# Patient Record
Sex: Female | Born: 1935
Health system: Southern US, Community
[De-identification: ages and names within clinical notes are randomized; demographics above are authoritative.]

## PROBLEM LIST (undated history)

## (undated) DIAGNOSIS — I10 Essential (primary) hypertension: Secondary | ICD-10-CM

## (undated) DIAGNOSIS — K529 Noninfective gastroenteritis and colitis, unspecified: Secondary | ICD-10-CM

## (undated) DIAGNOSIS — K5790 Diverticulosis of intestine, part unspecified, without perforation or abscess without bleeding: Secondary | ICD-10-CM

## (undated) DIAGNOSIS — E559 Vitamin D deficiency, unspecified: Secondary | ICD-10-CM

## (undated) DIAGNOSIS — E7800 Pure hypercholesterolemia, unspecified: Secondary | ICD-10-CM

## (undated) DIAGNOSIS — M8580 Other specified disorders of bone density and structure, unspecified site: Secondary | ICD-10-CM

## (undated) DIAGNOSIS — H409 Unspecified glaucoma: Secondary | ICD-10-CM

## (undated) DIAGNOSIS — E079 Disorder of thyroid, unspecified: Secondary | ICD-10-CM

## (undated) DIAGNOSIS — N6019 Diffuse cystic mastopathy of unspecified breast: Secondary | ICD-10-CM

## (undated) DIAGNOSIS — K219 Gastro-esophageal reflux disease without esophagitis: Secondary | ICD-10-CM

## (undated) DIAGNOSIS — M1990 Unspecified osteoarthritis, unspecified site: Secondary | ICD-10-CM

## (undated) DIAGNOSIS — D649 Anemia, unspecified: Secondary | ICD-10-CM

## (undated) DIAGNOSIS — K589 Irritable bowel syndrome without diarrhea: Secondary | ICD-10-CM

## (undated) DIAGNOSIS — R011 Cardiac murmur, unspecified: Secondary | ICD-10-CM

## (undated) DIAGNOSIS — K648 Other hemorrhoids: Secondary | ICD-10-CM

## (undated) DIAGNOSIS — B019 Varicella without complication: Secondary | ICD-10-CM

## (undated) DIAGNOSIS — G43909 Migraine, unspecified, not intractable, without status migrainosus: Secondary | ICD-10-CM

## (undated) DIAGNOSIS — D126 Benign neoplasm of colon, unspecified: Secondary | ICD-10-CM

## (undated) DIAGNOSIS — K5792 Diverticulitis of intestine, part unspecified, without perforation or abscess without bleeding: Secondary | ICD-10-CM

## (undated) DIAGNOSIS — Z8659 Personal history of other mental and behavioral disorders: Secondary | ICD-10-CM

## (undated) DIAGNOSIS — R768 Other specified abnormal immunological findings in serum: Secondary | ICD-10-CM

## (undated) DIAGNOSIS — I639 Cerebral infarction, unspecified: Secondary | ICD-10-CM

## (undated) DIAGNOSIS — Z85828 Personal history of other malignant neoplasm of skin: Secondary | ICD-10-CM

## (undated) HISTORY — DX: Migraine, unspecified, not intractable, without status migrainosus: G43.909

## (undated) HISTORY — DX: Personal history of other mental and behavioral disorders: Z86.59

## (undated) HISTORY — DX: Cardiac murmur, unspecified: R01.1

## (undated) HISTORY — DX: Varicella without complication: B01.9

## (undated) HISTORY — PX: MANDIBLE FRACTURE SURGERY: SHX706

## (undated) HISTORY — DX: Noninfective gastroenteritis and colitis, unspecified: K52.9

## (undated) HISTORY — DX: Essential (primary) hypertension: I10

## (undated) HISTORY — DX: Unspecified glaucoma: H40.9

## (undated) HISTORY — DX: Unspecified osteoarthritis, unspecified site: M19.90

## (undated) HISTORY — DX: Other hemorrhoids: K64.8

## (undated) HISTORY — DX: Diverticulosis of intestine, part unspecified, without perforation or abscess without bleeding: K57.90

## (undated) HISTORY — DX: Diffuse cystic mastopathy of unspecified breast: N60.19

## (undated) HISTORY — DX: Diverticulitis of intestine, part unspecified, without perforation or abscess without bleeding: K57.92

## (undated) HISTORY — DX: Personal history of other malignant neoplasm of skin: Z85.828

## (undated) HISTORY — PX: APPENDECTOMY: SHX54

## (undated) HISTORY — DX: Gastro-esophageal reflux disease without esophagitis: K21.9

## (undated) HISTORY — DX: Vitamin D deficiency, unspecified: E55.9

## (undated) HISTORY — DX: Cerebral infarction, unspecified: I63.9

## (undated) HISTORY — DX: Irritable bowel syndrome, unspecified: K58.9

## (undated) HISTORY — DX: Other specified abnormal immunological findings in serum: R76.8

## (undated) HISTORY — DX: Anemia, unspecified: D64.9

## (undated) HISTORY — PX: BREAST BIOPSY: SHX20

## (undated) HISTORY — DX: Pure hypercholesterolemia, unspecified: E78.00

## (undated) HISTORY — DX: Other specified disorders of bone density and structure, unspecified site: M85.80

## (undated) HISTORY — DX: Benign neoplasm of colon, unspecified: D12.6

---

## 1971-01-29 HISTORY — PX: ABDOMINAL HYSTERECTOMY: SHX81

## 2002-01-28 HISTORY — PX: BILATERAL SALPINGOOPHORECTOMY: SHX1223

## 2003-01-29 HISTORY — PX: VENTRAL HERNIA REPAIR: SHX424

## 2003-06-21 ENCOUNTER — Other Ambulatory Visit: Payer: Self-pay

## 2004-09-25 ENCOUNTER — Ambulatory Visit: Payer: Self-pay | Admitting: Internal Medicine

## 2005-04-02 ENCOUNTER — Ambulatory Visit: Payer: Self-pay | Admitting: Urology

## 2005-05-20 ENCOUNTER — Ambulatory Visit: Payer: Self-pay | Admitting: Unknown Physician Specialty

## 2005-05-29 ENCOUNTER — Emergency Department: Payer: Self-pay | Admitting: Unknown Physician Specialty

## 2005-07-02 ENCOUNTER — Ambulatory Visit: Payer: Self-pay | Admitting: Otolaryngology

## 2005-10-16 ENCOUNTER — Ambulatory Visit: Payer: Self-pay | Admitting: Internal Medicine

## 2005-10-30 ENCOUNTER — Emergency Department: Payer: Self-pay | Admitting: Emergency Medicine

## 2005-12-05 ENCOUNTER — Ambulatory Visit: Payer: Self-pay | Admitting: Internal Medicine

## 2006-07-03 ENCOUNTER — Other Ambulatory Visit: Payer: Self-pay

## 2006-07-03 ENCOUNTER — Emergency Department: Payer: Self-pay | Admitting: Emergency Medicine

## 2006-09-03 ENCOUNTER — Ambulatory Visit: Payer: Self-pay | Admitting: Ophthalmology

## 2007-01-13 ENCOUNTER — Ambulatory Visit: Payer: Self-pay | Admitting: Internal Medicine

## 2007-09-11 ENCOUNTER — Ambulatory Visit: Payer: Self-pay | Admitting: Internal Medicine

## 2008-01-30 ENCOUNTER — Emergency Department: Payer: Self-pay | Admitting: Emergency Medicine

## 2008-03-11 ENCOUNTER — Inpatient Hospital Stay: Payer: Self-pay | Admitting: Internal Medicine

## 2008-03-23 ENCOUNTER — Inpatient Hospital Stay: Payer: Self-pay | Admitting: Internal Medicine

## 2008-04-05 ENCOUNTER — Emergency Department: Payer: Self-pay | Admitting: Emergency Medicine

## 2008-04-12 ENCOUNTER — Ambulatory Visit: Payer: Self-pay | Admitting: Internal Medicine

## 2008-06-29 ENCOUNTER — Ambulatory Visit: Payer: Self-pay | Admitting: Internal Medicine

## 2009-07-14 ENCOUNTER — Ambulatory Visit: Payer: Self-pay | Admitting: Internal Medicine

## 2010-04-28 ENCOUNTER — Emergency Department: Payer: Self-pay | Admitting: Emergency Medicine

## 2010-08-09 ENCOUNTER — Ambulatory Visit: Payer: Self-pay | Admitting: Internal Medicine

## 2011-03-08 DIAGNOSIS — H353190 Nonexudative age-related macular degeneration, unspecified eye, stage unspecified: Secondary | ICD-10-CM | POA: Diagnosis not present

## 2011-03-28 DIAGNOSIS — E7800 Pure hypercholesterolemia, unspecified: Secondary | ICD-10-CM | POA: Diagnosis not present

## 2011-03-28 DIAGNOSIS — K219 Gastro-esophageal reflux disease without esophagitis: Secondary | ICD-10-CM | POA: Diagnosis not present

## 2011-03-28 DIAGNOSIS — I10 Essential (primary) hypertension: Secondary | ICD-10-CM | POA: Diagnosis not present

## 2011-03-28 DIAGNOSIS — R5381 Other malaise: Secondary | ICD-10-CM | POA: Diagnosis not present

## 2011-04-08 DIAGNOSIS — E7800 Pure hypercholesterolemia, unspecified: Secondary | ICD-10-CM | POA: Diagnosis not present

## 2011-04-08 DIAGNOSIS — R5381 Other malaise: Secondary | ICD-10-CM | POA: Diagnosis not present

## 2011-04-08 DIAGNOSIS — E559 Vitamin D deficiency, unspecified: Secondary | ICD-10-CM | POA: Diagnosis not present

## 2011-04-08 DIAGNOSIS — I10 Essential (primary) hypertension: Secondary | ICD-10-CM | POA: Diagnosis not present

## 2011-05-30 DIAGNOSIS — G4700 Insomnia, unspecified: Secondary | ICD-10-CM | POA: Diagnosis not present

## 2011-05-30 DIAGNOSIS — F331 Major depressive disorder, recurrent, moderate: Secondary | ICD-10-CM | POA: Diagnosis not present

## 2011-07-02 DIAGNOSIS — M5430 Sciatica, unspecified side: Secondary | ICD-10-CM | POA: Diagnosis not present

## 2011-07-25 DIAGNOSIS — E7800 Pure hypercholesterolemia, unspecified: Secondary | ICD-10-CM | POA: Diagnosis not present

## 2011-07-25 DIAGNOSIS — I10 Essential (primary) hypertension: Secondary | ICD-10-CM | POA: Diagnosis not present

## 2011-07-25 DIAGNOSIS — M79609 Pain in unspecified limb: Secondary | ICD-10-CM | POA: Diagnosis not present

## 2011-07-29 DIAGNOSIS — I10 Essential (primary) hypertension: Secondary | ICD-10-CM | POA: Diagnosis not present

## 2011-07-29 DIAGNOSIS — E7800 Pure hypercholesterolemia, unspecified: Secondary | ICD-10-CM | POA: Diagnosis not present

## 2011-07-29 DIAGNOSIS — R634 Abnormal weight loss: Secondary | ICD-10-CM | POA: Diagnosis not present

## 2011-12-03 DIAGNOSIS — G4700 Insomnia, unspecified: Secondary | ICD-10-CM | POA: Diagnosis not present

## 2011-12-03 DIAGNOSIS — F331 Major depressive disorder, recurrent, moderate: Secondary | ICD-10-CM | POA: Diagnosis not present

## 2011-12-18 ENCOUNTER — Encounter: Payer: Self-pay | Admitting: Internal Medicine

## 2011-12-18 ENCOUNTER — Telehealth: Payer: Self-pay | Admitting: Internal Medicine

## 2011-12-18 ENCOUNTER — Ambulatory Visit (INDEPENDENT_AMBULATORY_CARE_PROVIDER_SITE_OTHER): Payer: Medicare Other | Admitting: Internal Medicine

## 2011-12-18 VITALS — BP 132/64 | HR 76 | Temp 98.1°F | Ht 65.0 in | Wt 141.5 lb

## 2011-12-18 DIAGNOSIS — K469 Unspecified abdominal hernia without obstruction or gangrene: Secondary | ICD-10-CM

## 2011-12-18 DIAGNOSIS — K219 Gastro-esophageal reflux disease without esophagitis: Secondary | ICD-10-CM

## 2011-12-18 DIAGNOSIS — M8580 Other specified disorders of bone density and structure, unspecified site: Secondary | ICD-10-CM

## 2011-12-18 DIAGNOSIS — R109 Unspecified abdominal pain: Secondary | ICD-10-CM | POA: Diagnosis not present

## 2011-12-18 DIAGNOSIS — I10 Essential (primary) hypertension: Secondary | ICD-10-CM | POA: Diagnosis not present

## 2011-12-18 DIAGNOSIS — E7800 Pure hypercholesterolemia, unspecified: Secondary | ICD-10-CM

## 2011-12-18 DIAGNOSIS — R0989 Other specified symptoms and signs involving the circulatory and respiratory systems: Secondary | ICD-10-CM

## 2011-12-18 DIAGNOSIS — R413 Other amnesia: Secondary | ICD-10-CM | POA: Diagnosis not present

## 2011-12-18 DIAGNOSIS — R198 Other specified symptoms and signs involving the digestive system and abdomen: Secondary | ICD-10-CM

## 2011-12-18 DIAGNOSIS — R5381 Other malaise: Secondary | ICD-10-CM | POA: Diagnosis not present

## 2011-12-18 DIAGNOSIS — R194 Change in bowel habit: Secondary | ICD-10-CM

## 2011-12-18 DIAGNOSIS — F329 Major depressive disorder, single episode, unspecified: Secondary | ICD-10-CM

## 2011-12-18 DIAGNOSIS — R5383 Other fatigue: Secondary | ICD-10-CM

## 2011-12-18 MED ORDER — LOSARTAN POTASSIUM 50 MG PO TABS: 50.0000 mg | ORAL_TABLET | Freq: Every day | ORAL | Status: DC

## 2011-12-18 NOTE — Patient Instructions (Addendum)
It was nice seeing you today.  I am sorry you have not been feeling well.  We will get an appt scheduled with GI and Dr [name].  Monitor your blood pressures.  Let me know if problems.

## 2011-12-18 NOTE — Telephone Encounter (Signed)
Patient aware of appointment with [facility] for her screening mammogram on 11.21.13 @ 9:20

## 2011-12-19 ENCOUNTER — Telehealth: Payer: Self-pay | Admitting: Internal Medicine

## 2011-12-19 ENCOUNTER — Ambulatory Visit: Payer: Self-pay | Admitting: Internal Medicine

## 2011-12-19 DIAGNOSIS — Z1231 Encounter for screening mammogram for malignant neoplasm of breast: Secondary | ICD-10-CM | POA: Diagnosis not present

## 2011-12-19 NOTE — Progress Notes (Signed)
Subjective:    Patient ID: Tamara [name], female    DOB: 01/05/1928, 78 y.o.   MRN: [id number]  HPI 78 year old female with past history of hypercholesterolemia, reflux disease (previous H. Pylori), FCD, glaucoma and colitis previously maintained on Pentasa.  She comes in today for a scheduled follow up.  States that recently she had some "stomach issues".  Described a burning pain with decreased appetite.  Took Mylanta.  No change in bowels.  States her stomach is back to normal now.  No burning and no pain.  She does report a change in her bowels.  Not as regular.  No urine change.  When her stomach was flared, she did not take any of her meds.  She is off her blood pressure medication and her cholesterol medication.  States her daughter has been monitoring her blood pressure and it has been under reasonable control.    Past Medical History  Diagnosis Date  . Hypertension   . Hypercholesterolemia   . GERD (gastroesophageal reflux disease)     EGD - gastritis/mild duodenitis, previous positive H. pylori  . Hx of colonic polyps     tubular adenomatous  . Fibrocystic breast disease   . Degenerative joint disease   . Osteopenia   . History of migraine headaches   . History of depression   . History of skin cancer     facial  . Vitamin D deficiency   . Chicken pox   . Glaucoma   . Chronic headaches   . Migraines   . Colon polyps     Outpatient Encounter Prescriptions as of 12/18/2011  Medication Sig Dispense Refill  . gabapentin (NEURONTIN) 100 MG capsule Take 100 mg by mouth as needed.      [name] PARoxetine (PAXIL) 20 MG tablet Take 20 mg by mouth every morning.      . vitamin B-12 (CYANOCOBALAMIN) 100 MCG tablet Take 50 mcg by mouth daily.      . [DISCONTINUED] losartan (COZAAR) 100 MG tablet Take 100 mg by mouth daily.      . [DISCONTINUED] zolpidem (AMBIEN) 5 MG tablet Take 5 mg by mouth at bedtime as needed.      [name] acetaminophen (TYLENOL) 650 MG CR tablet 650 mg. 2 tablets bid prn       . Calcium Carbonate-Vitamin D (CALCIUM 600+D) 600-400 MG-UNIT per tablet Take 1 tablet by mouth 2 (two) times daily.      . Cholecalciferol (VITAMIN D3) 2000 UNITS TABS Take 1 tablet by mouth daily.      [name] ezetimibe-simvastatin (VYTORIN) 10-20 MG per tablet Take 1 tablet by mouth at bedtime.      [name] losartan (COZAAR) 50 MG tablet Take 1 tablet (50 mg total) by mouth daily.  30 tablet  3  . pravastatin (PRAVACHOL) 20 MG tablet Take 20 mg by mouth daily.      . [DISCONTINUED] PARoxetine (PAXIL-CR) 25 MG 24 hr tablet Take 25 mg by mouth daily.        Review of Systems Patient denies any headache, lightheadedness or dizziness.  No sinus or allergy problems.   No chest pain, tightness or palpitations.  No increased shortness of breath, cough or congestion.  No nausea or vomiting.  No abdominal pain or cramping now.  Previously had a flare (as outlined above) - that lasted approximately one month.   Some bowel change.  Not as regular.  No BRBPR or melana.  No urine change.  Objective:   Physical Exam Filed Vitals:   12/18/11 0930  BP: 132/64  Pulse: 76  Temp: 98.1 F (36.7 C)   Blood pressure recheck:  30/109  78 year old female in no acute distress.   HEENT:  Nares - clear.  OP- without lesions or erythema.  NECK:  Supple, nontender.  Left carotid bruit.   HEART:  Appears to be regular.  I/VI systolic murmur.  LUNGS:  Without crackles or wheezing audible.  Respirations even and unlabored.   RADIAL PULSE:  Equal bilaterally.  ABDOMEN:  Soft.  Minimal fullness right lower quadrant - improved with lying down.  Minimal tenderness to palpation over the right lower quadrant.  Bowel sounds present and normal.    EXTREMITIES:  No increased edema to be present.                     Assessment & Plan:  ABDOMINAL PAIN.  With the abdominal fullness (improved with lying down). - concern regarding a hernia.  Increased pain with palpation.  Given the findings on exam and pain, I do feel she  warrants evaluation by surgery.  Pt comfortable with this plan.  Will schedule an appt with Dr Renda Rolls.  Follow.  Any worsening change or problems, she needs to be reevaluated.  She also has the history of "colitis".  Was previously on Pentasa.  Not taking now.  Had recent problems with increased burning pain in her stomach.  Lasted approximately one month.  Resolved now.  Needs referral back to GI for evaluation and possible scope.  Has noticed the change in her bowels as outlined.    QUESTION OF MILD COGNITIVE IMPAIRMENT. Evaluated recently by Dr Imogene Burn.  Mini mental status exam 26/30.  Had recommended evaluation.  Patient feels she was not concentrating during the exam.  Has a lot of family issues.  Will check routine labs including thyroid function and B12.   INCREASED PSYCHOSOCIAL STRESSORS.  Increased family stress.  On Paroxetine.  Continue to follow up with Dr Imogene Burn.   CARDIOVASCULAR.  Stress test 02/25/07 revealed normal LV function with no evidence of any ischemia.  Known murmur.  Continue risk factor modification.  Currently asymptomatic.    CAROTID BRUIT.  Left carotid bruit.  Previous carotid ultrasound revealed no significant stenosis.  Continue daily aspirin.    FATIGUE.  Check cbc, met c and tsh.   HEALTH MAINTENANCE.  Physical 03/28/11.  S/P hysterectomy.  Had mammogram.  Obtain results.

## 2011-12-19 NOTE — Telephone Encounter (Signed)
error 

## 2011-12-20 NOTE — Assessment & Plan Note (Addendum)
Blood pressure elevated.  Will restart Losartan.  Follow.  Check metabolic panel.  Have her follow pressures.  Get her back in soon to reassess.

## 2011-12-20 NOTE — Assessment & Plan Note (Signed)
Off her cholesterol medicine.  Will recheck lipid profile.  Low cholesterol diet.

## 2011-12-25 ENCOUNTER — Other Ambulatory Visit (INDEPENDENT_AMBULATORY_CARE_PROVIDER_SITE_OTHER): Payer: Medicare Other

## 2011-12-25 DIAGNOSIS — R413 Other amnesia: Secondary | ICD-10-CM

## 2011-12-25 DIAGNOSIS — R5381 Other malaise: Secondary | ICD-10-CM | POA: Diagnosis not present

## 2011-12-25 DIAGNOSIS — E7800 Pure hypercholesterolemia, unspecified: Secondary | ICD-10-CM | POA: Diagnosis not present

## 2011-12-25 DIAGNOSIS — R109 Unspecified abdominal pain: Secondary | ICD-10-CM

## 2011-12-25 DIAGNOSIS — R5383 Other fatigue: Secondary | ICD-10-CM | POA: Diagnosis not present

## 2011-12-25 DIAGNOSIS — I10 Essential (primary) hypertension: Secondary | ICD-10-CM | POA: Diagnosis not present

## 2011-12-25 LAB — LDL CHOLESTEROL, DIRECT: Direct LDL: 270.8 mg/dL

## 2011-12-25 LAB — BASIC METABOLIC PANEL
BUN: 12 mg/dL (ref 6–23)
CO2: 27 mEq/L (ref 19–32)
Chloride: 105 mEq/L (ref 96–112)
Creatinine, Ser: 0.8 mg/dL (ref 0.4–1.2)
Glucose, Bld: 100 mg/dL — ABNORMAL HIGH (ref 70–99)

## 2011-12-25 LAB — HEPATIC FUNCTION PANEL
ALT: 11 U/L (ref 0–35)
AST: 18 U/L (ref 0–37)
Alkaline Phosphatase: 64 U/L (ref 39–117)
Bilirubin, Direct: 0 mg/dL (ref 0.0–0.3)
Total Bilirubin: 0.6 mg/dL (ref 0.3–1.2)

## 2011-12-25 LAB — CBC WITH DIFFERENTIAL/PLATELET
Eosinophils Relative: 0.6 % (ref 0.0–5.0)
HCT: 39.4 % (ref 36.0–46.0)
Lymphs Abs: 1.4 10*3/uL (ref 0.7–4.0)
Monocytes Relative: 6.1 % (ref 3.0–12.0)
Platelets: 377 10*3/uL (ref 150.0–400.0)
WBC: 5.1 10*3/uL (ref 4.5–10.5)

## 2011-12-25 LAB — TSH: TSH: 2.26 u[IU]/mL (ref 0.35–5.50)

## 2011-12-25 LAB — LIPID PANEL: Total CHOL/HDL Ratio: 6

## 2012-01-13 DIAGNOSIS — K439 Ventral hernia without obstruction or gangrene: Secondary | ICD-10-CM | POA: Diagnosis not present

## 2012-01-17 ENCOUNTER — Ambulatory Visit (INDEPENDENT_AMBULATORY_CARE_PROVIDER_SITE_OTHER): Payer: Medicare Other | Admitting: Internal Medicine

## 2012-01-17 ENCOUNTER — Encounter: Payer: Self-pay | Admitting: Internal Medicine

## 2012-01-17 ENCOUNTER — Telehealth: Payer: Self-pay | Admitting: *Deleted

## 2012-01-17 VITALS — BP 130/70 | HR 75 | Temp 98.6°F | Ht 65.0 in | Wt 143.0 lb

## 2012-01-17 DIAGNOSIS — K219 Gastro-esophageal reflux disease without esophagitis: Secondary | ICD-10-CM | POA: Insufficient documentation

## 2012-01-17 DIAGNOSIS — I10 Essential (primary) hypertension: Secondary | ICD-10-CM

## 2012-01-17 DIAGNOSIS — E7800 Pure hypercholesterolemia, unspecified: Secondary | ICD-10-CM

## 2012-01-17 DIAGNOSIS — F329 Major depressive disorder, single episode, unspecified: Secondary | ICD-10-CM

## 2012-01-17 DIAGNOSIS — R0989 Other specified symptoms and signs involving the circulatory and respiratory systems: Secondary | ICD-10-CM | POA: Insufficient documentation

## 2012-01-17 DIAGNOSIS — M8580 Other specified disorders of bone density and structure, unspecified site: Secondary | ICD-10-CM | POA: Insufficient documentation

## 2012-01-17 NOTE — Telephone Encounter (Signed)
The pt had left before i was able to get her blood work Nurse, mental health)

## 2012-01-18 ENCOUNTER — Encounter: Payer: Self-pay | Admitting: Internal Medicine

## 2012-01-18 ENCOUNTER — Other Ambulatory Visit: Payer: Self-pay | Admitting: Internal Medicine

## 2012-01-18 DIAGNOSIS — E7800 Pure hypercholesterolemia, unspecified: Secondary | ICD-10-CM

## 2012-01-18 NOTE — Assessment & Plan Note (Signed)
Stable.  On paroxitine.

## 2012-01-18 NOTE — Assessment & Plan Note (Signed)
Symptoms controlled.  Follow.   

## 2012-01-18 NOTE — Progress Notes (Signed)
Subjective:    Patient ID: Tamara [name], female    DOB: 1927/03/02, 78 y.o.   MRN: [id number]  HPI 78 year old female with past history of hypercholesterolemia, reflux disease (previous H. Pylori), FCD, glaucoma and colitis previously maintained on Pentasa.  She comes in today for a scheduled follow up.  Here to follow up regarding her blood pressure.  Back on the Losartan now.  Having increased pain - hernia.  Saw Dr [name].  Planning for surgery in January.   No change in bowels. No other abdominal pain or cramping.  States her stomach is back to normal now.  No burning and no pain.  Still planning to see GI in follow up.    Past Medical History  Diagnosis Date  . Hypertension   . Hypercholesterolemia   . GERD (gastroesophageal reflux disease)     EGD - gastritis/mild duodenitis, previous positive H. pylori  . Hx of colonic polyps     tubular adenomatous  . Fibrocystic breast disease   . Degenerative joint disease   . Osteopenia   . History of migraine headaches   . History of depression   . History of skin cancer     facial  . Vitamin D deficiency   . Chicken pox   . Glaucoma   . Chronic headaches   . Migraines   . Colon polyps     Outpatient Encounter Prescriptions as of 01/17/2012  Medication Sig Dispense Refill  . acetaminophen (TYLENOL) 650 MG CR tablet 650 mg. 2 tablets bid prn      . Calcium Carbonate-Vitamin D (CALCIUM 600+D) 600-400 MG-UNIT per tablet Take 1 tablet by mouth 2 (two) times daily.      . Cholecalciferol (VITAMIN D3) 2000 UNITS TABS Take 1 tablet by mouth daily.      [name] ezetimibe-simvastatin (VYTORIN) 10-20 MG per tablet Take 1 tablet by mouth at bedtime.      . gabapentin (NEURONTIN) 100 MG capsule Take 100 mg by mouth as needed.      [name] losartan (COZAAR) 50 MG tablet Take 1 tablet (50 mg total) by mouth daily.  30 tablet  3  . PARoxetine (PAXIL) 20 MG tablet Take 20 mg by mouth every morning.      . pravastatin (PRAVACHOL) 20 MG tablet Take 20 mg by  mouth daily.      . vitamin B-12 (CYANOCOBALAMIN) 100 MCG tablet Take 50 mcg by mouth daily.        Review of Systems Patient denies any headache.  No significant lightheadedness or dizziness.  No sinus or allergy problems.   No chest pain, tightness or palpitations.  No increased shortness of breath, cough or congestion.  No nausea or vomiting.  No abdominal pain or cramping now, except for the pain with the hernia.  No BRBPR or melana.  No urine change.        Objective:   Physical Exam  Filed Vitals:   01/17/12 1418  BP: 130/70  Pulse: 75  Temp: 98.6 F (37 C)   Blood pressure recheck:  91/46  78 year old female in no acute distress.   HEENT:  Nares - clear.  OP- without lesions or erythema.  NECK:  Supple, nontender.  Left carotid bruit.   HEART:  Appears to be regular.  I/VI systolic murmur.  LUNGS:  Without crackles or wheezing audible.  Respirations even and unlabored.   RADIAL PULSE:  Equal bilaterally.  ABDOMEN:  Soft.  Minimal fullness right lower  quadrant - improved with lying down.  Minimal tenderness to palpation over the right lower quadrant.  Bowel sounds present and normal.    EXTREMITIES:  No increased edema to be present.                     Assessment & Plan:  ABDOMINAL PAIN.  The previous flare has resolved.  Has a history of colitis.  Planning to follow up with GI.    HERNIA.  Saw Dr [name]. Planning for surgery.  Currently asymptomatic - no chest pain, tightness or sob.  Will need close intra op and post op monitoring of heart rate and blood pressure to avoid extremes.   INCREASED PSYCHOSOCIAL STRESSORS.  Increased family stress.  On Paroxetine.  Continue to follow up with Dr Imogene Burn.   CARDIOVASCULAR.  Stress test 02/25/07 revealed normal LV function with no evidence of any ischemia.  Known murmur.  Continue risk factor modification.  Currently asymptomatic.    CAROTID BRUIT.  Left carotid bruit.  Previous carotid ultrasound revealed no significant stenosis.   Continue daily aspirin.     HEALTH MAINTENANCE.  Physical 03/28/11.  S/P hysterectomy.  Had mammogram.  Obtain results.

## 2012-01-18 NOTE — Assessment & Plan Note (Signed)
Just started on Pravastatin.  Low cholesterol diet. Check liver panel today.

## 2012-01-18 NOTE — Telephone Encounter (Signed)
Please notify pt that she needs a liver panel checked (since she started on a new cholesterol medicine).  She left the other day without getting it drawn.  I will order lab.  Please have her come in for a lab appt only.  Non fasting lab

## 2012-01-18 NOTE — Progress Notes (Signed)
Ordered follow up lab (liver panel) since starting cholesterol medicine

## 2012-01-18 NOTE — Assessment & Plan Note (Signed)
Blood pressure much better.  Same meds.  Follow.

## 2012-01-20 ENCOUNTER — Ambulatory Visit: Payer: Self-pay | Admitting: Surgery

## 2012-01-20 DIAGNOSIS — K439 Ventral hernia without obstruction or gangrene: Secondary | ICD-10-CM | POA: Diagnosis not present

## 2012-01-20 DIAGNOSIS — I10 Essential (primary) hypertension: Secondary | ICD-10-CM | POA: Diagnosis not present

## 2012-01-20 DIAGNOSIS — Z01812 Encounter for preprocedural laboratory examination: Secondary | ICD-10-CM | POA: Diagnosis not present

## 2012-01-20 LAB — CBC
HCT: 37.4 % (ref 35.0–47.0)
HGB: 12.5 g/dL (ref 12.0–16.0)
MCH: 28.4 pg (ref 26.0–34.0)
MCHC: 33.4 g/dL (ref 32.0–36.0)
MCV: 85 fL (ref 80–100)
RBC: 4.39 10*6/uL (ref 3.80–5.20)
WBC: 6.2 10*3/uL (ref 3.6–11.0)

## 2012-01-20 LAB — COMPREHENSIVE METABOLIC PANEL
Albumin: 3.6 g/dL (ref 3.4–5.0)
Alkaline Phosphatase: 87 U/L (ref 50–136)
Anion Gap: 4 — ABNORMAL LOW (ref 7–16)
BUN: 18 mg/dL (ref 7–18)
Bilirubin,Total: 0.2 mg/dL (ref 0.2–1.0)
Chloride: 105 mmol/L (ref 98–107)
Co2: 30 mmol/L (ref 21–32)
Creatinine: 0.83 mg/dL (ref 0.60–1.30)
EGFR (African American): >60
Osmolality: 278 (ref 275–301)
Potassium: 3.7 mmol/L (ref 3.5–5.1)
Sodium: 139 mmol/L (ref 136–145)
Total Protein: 7.4 g/dL (ref 6.4–8.2)

## 2012-01-20 NOTE — Telephone Encounter (Signed)
Called home # line was busy. Will call again later.

## 2012-01-20 NOTE — Telephone Encounter (Signed)
Patients lab appoint scheduled for 02/10/12 for liver panel

## 2012-01-23 ENCOUNTER — Encounter: Payer: Self-pay | Admitting: *Deleted

## 2012-01-31 ENCOUNTER — Ambulatory Visit: Payer: Self-pay | Admitting: Surgery

## 2012-01-31 DIAGNOSIS — H409 Unspecified glaucoma: Secondary | ICD-10-CM | POA: Diagnosis not present

## 2012-01-31 DIAGNOSIS — Z888 Allergy status to other drugs, medicaments and biological substances status: Secondary | ICD-10-CM | POA: Diagnosis not present

## 2012-01-31 DIAGNOSIS — H9190 Unspecified hearing loss, unspecified ear: Secondary | ICD-10-CM | POA: Diagnosis not present

## 2012-01-31 DIAGNOSIS — M549 Dorsalgia, unspecified: Secondary | ICD-10-CM | POA: Diagnosis not present

## 2012-01-31 DIAGNOSIS — Z79899 Other long term (current) drug therapy: Secondary | ICD-10-CM | POA: Diagnosis not present

## 2012-01-31 DIAGNOSIS — R609 Edema, unspecified: Secondary | ICD-10-CM | POA: Diagnosis not present

## 2012-01-31 DIAGNOSIS — K439 Ventral hernia without obstruction or gangrene: Secondary | ICD-10-CM | POA: Diagnosis not present

## 2012-01-31 DIAGNOSIS — K432 Incisional hernia without obstruction or gangrene: Secondary | ICD-10-CM | POA: Diagnosis not present

## 2012-01-31 DIAGNOSIS — M129 Arthropathy, unspecified: Secondary | ICD-10-CM | POA: Diagnosis not present

## 2012-01-31 DIAGNOSIS — H9319 Tinnitus, unspecified ear: Secondary | ICD-10-CM | POA: Diagnosis not present

## 2012-01-31 DIAGNOSIS — I10 Essential (primary) hypertension: Secondary | ICD-10-CM | POA: Diagnosis not present

## 2012-01-31 DIAGNOSIS — G43909 Migraine, unspecified, not intractable, without status migrainosus: Secondary | ICD-10-CM | POA: Diagnosis not present

## 2012-01-31 DIAGNOSIS — Z885 Allergy status to narcotic agent status: Secondary | ICD-10-CM | POA: Diagnosis not present

## 2012-02-03 DIAGNOSIS — G4700 Insomnia, unspecified: Secondary | ICD-10-CM | POA: Diagnosis not present

## 2012-02-03 DIAGNOSIS — F331 Major depressive disorder, recurrent, moderate: Secondary | ICD-10-CM | POA: Diagnosis not present

## 2012-02-10 ENCOUNTER — Other Ambulatory Visit (INDEPENDENT_AMBULATORY_CARE_PROVIDER_SITE_OTHER): Payer: Medicare Other

## 2012-02-10 DIAGNOSIS — I10 Essential (primary) hypertension: Secondary | ICD-10-CM

## 2012-02-10 DIAGNOSIS — E7800 Pure hypercholesterolemia, unspecified: Secondary | ICD-10-CM

## 2012-02-10 LAB — BASIC METABOLIC PANEL
BUN: 14 mg/dL (ref 6–23)
CO2: 29 mEq/L (ref 19–32)
Calcium: 9.4 mg/dL (ref 8.4–10.5)
Creatinine, Ser: 0.7 mg/dL (ref 0.4–1.2)
GFR: 86.56 mL/min (ref >60.00)
Glucose, Bld: 97 mg/dL (ref 70–99)
Sodium: 140 mEq/L (ref 135–145)

## 2012-02-10 LAB — HEPATIC FUNCTION PANEL
ALT: 15 U/L (ref 0–35)
AST: 17 U/L (ref 0–37)
Alkaline Phosphatase: 77 U/L (ref 39–117)
Bilirubin, Direct: 0.1 mg/dL (ref 0.0–0.3)
Total Bilirubin: 0.6 mg/dL (ref 0.3–1.2)
Total Protein: 7.2 g/dL (ref 6.0–8.3)

## 2012-02-11 ENCOUNTER — Other Ambulatory Visit: Payer: Self-pay | Admitting: Internal Medicine

## 2012-02-11 NOTE — Progress Notes (Signed)
Deleted vytorin.  Now on pravastatin

## 2012-02-19 ENCOUNTER — Telehealth: Payer: Self-pay | Admitting: Internal Medicine

## 2012-02-19 ENCOUNTER — Ambulatory Visit: Payer: Medicare Other | Admitting: Internal Medicine

## 2012-02-19 NOTE — Telephone Encounter (Signed)
No charge. 

## 2012-02-24 MED ORDER — PRAVASTATIN SODIUM 40 MG PO TABS: 40.0000 mg | ORAL_TABLET | Freq: Every day | ORAL | Status: DC

## 2012-02-24 NOTE — Addendum Note (Signed)
Addended by: Algis Downs on: 02/24/2012 08:47 AM   Modules accepted: Orders

## 2012-04-10 ENCOUNTER — Ambulatory Visit (INDEPENDENT_AMBULATORY_CARE_PROVIDER_SITE_OTHER): Payer: Medicare Other | Admitting: Internal Medicine

## 2012-04-10 ENCOUNTER — Encounter: Payer: Self-pay | Admitting: Internal Medicine

## 2012-04-10 VITALS — BP 148/80 | HR 80 | Ht 63.5 in | Wt 145.1 lb

## 2012-04-10 DIAGNOSIS — R1032 Left lower quadrant pain: Secondary | ICD-10-CM

## 2012-04-10 DIAGNOSIS — K5100 Ulcerative (chronic) pancolitis without complications: Secondary | ICD-10-CM

## 2012-04-10 MED ORDER — MOVIPREP 100 G PO SOLR: 1.0000 | Freq: Once | ORAL | Status: DC

## 2012-04-10 NOTE — Patient Instructions (Addendum)
You have been scheduled for a colonoscopy with propofol. Please follow written instructions given to you at your visit today.  Please pick up your prep kit at the pharmacy within the next 1-3 days. If you use inhalers (even only as needed), please bring them with you on the day of your procedure.  Please continue taking Miralax and stool softeners.  CC: Dr Dale [place]

## 2012-04-10 NOTE — Progress Notes (Signed)
Tamara [name] 19-Sep-1927 MRN [id number]   History of Present Illness:  This is an 79 year old white female with diagnosis of ulcerative colitis made on a colonoscopy at [facility] in 2010 after she presented with weight loss, diarrhea and rectal bleeding She was transferred from [facility]  For further evaluation.[name] She was initially evaluated in [place], [place] by Dr. [name] and had an incomplete colonoscopy  because stenosis and tortuosity at the sigmoid pelvic junction. At [facility], she was treated for UIBD and was discharged on Pentasa which she took for  one year and then discontinued it on her owwn and has had no GI followup since then. She also has a history of adenomatous polyps found  on a colonoscopy in 2004  and  in 2007. There is a family history of colon cancer in her sister and esophageal cancer in her mother. Patient has been complaining of left lower quadrant abdominal discomfort as well as rectal pain and change in bowel habits with predominant constipation. She has been taking Colace 2-3 tablets daily and MiraLax on an as necessary basis. Her weight has been stable. She denies rectal bleeding. She underwent  EGD in 2007 and found to have gastritis. H.Pylori negative.   Past Medical History  Diagnosis Date  . Hypertension   . Hypercholesterolemia   . GERD (gastroesophageal reflux disease)     EGD - gastritis/mild duodenitis, previous positive H. pylori  . Tubular adenoma of colon   . Fibrocystic breast disease   . Degenerative joint disease   . Osteopenia   . History of depression   . History of skin cancer     facial  . Vitamin D deficiency   . Chicken pox   . Glaucoma   . Migraines   . Diverticulitis   . Anemia   . Diabetes mellitus   . Helicobacter pylori ab+   . Colitis   . Internal hemorrhoids   . Arthritis   . IBS (irritable bowel syndrome)    Past Surgical History  Procedure Laterality Date  . Breast biopsy Bilateral     x4  .  Appendectomy    . Bilateral salpingoophorectomy  2004    complex ovarian cyst (left) - benign  . Ventral hernia repair  2005    Dr. [name]  . Abdominal hysterectomy  1973    secondary to bleeding    reports that she has never smoked. She has never used smokeless tobacco. She reports that she does not drink alcohol or use illicit drugs. family history includes Cirrhosis in her brother; Colon cancer in her sister; Esophageal cancer in her mother; Ovarian cancer in her sister; Parkinson's disease in her brother; and Stomach cancer in her mother. Allergies  Allergen Reactions  . Vicodin (Hydrocodone-Acetaminophen)         Review of Systems:Denies heartburn or dysphagia  The remainder of the 10 point ROS is negative except as outlined in H&P   Physical Exam: General appearance  Well developed, in no distress. Eyes- non icteric. HEENT nontraumatic, normocephalic. Mouth no lesions, tongue papillated, no cheilosis. Neck supple without adenopathy, thyroid not enlarged, no carotid bruits, no JVD. Lungs Clear to auscultation bilaterally. Cor normal S1, normal S2, regular rhythm, no murmur,  quiet precordium. Abdomen: Soft with so good muscular support. Well-healed post herniorrhaphy scar in right lower quadrant. Mild tenderness overlying the scar. Tenderness in left lower quadrant without palpable mass. Rectal:Decreased rectal sphincter tone. Small external hemorrhoids. Soft Hemoccult negative stool. Extremities no pedal edema  The per fissure varicose veins. Skin no lesions. Neurological alert and oriented x 3. Psychological normal mood and affect.  Assessment and Plan:  Problem #4 79 year old white female with ta recent onset of left lower quadrant abdominal discomfort and history of ulcerative colitis which has not been followed in the last several years.We are requesting records from [facility] where the diagnosis was made. She is Hemoccult negative on my exam.She denies having   diarrhea. She, however complains of change in bowel habits toward constipation. There is a history of a difficult colonoscopy exam but it is not clear whether due to a stricture or due to  tortuous left colon. We will obtain records from [facility]. She is interested in pursuing a colonoscopy because of her family history of colon cancer as well as new onset of symptoms and history of polyps. We will prep her with Moviprep and use propofol for sedation. Her daughter is with her today and agrees with the plan. Patient will continue Colace 1-2 tablets daily supplemented with MiraLax 9-17 g 2-3 times a week. Consider IBD markers pending results of the colonoscopy  Problem #2 Esophageal reflux. Patient is currently asymptomatic. She had an upper endoscopy in 2007 showing CLO negative gastritis.   04/10/2012 Tamara [name]

## 2012-04-21 ENCOUNTER — Encounter: Payer: Self-pay | Admitting: Internal Medicine

## 2012-04-21 ENCOUNTER — Ambulatory Visit (AMBULATORY_SURGERY_CENTER): Payer: Medicare Other | Admitting: Internal Medicine

## 2012-04-21 VITALS — BP 176/73 | HR 66 | Temp 99.5°F | Resp 18 | Ht 65.0 in | Wt 143.0 lb

## 2012-04-21 DIAGNOSIS — D126 Benign neoplasm of colon, unspecified: Secondary | ICD-10-CM

## 2012-04-21 DIAGNOSIS — I10 Essential (primary) hypertension: Secondary | ICD-10-CM | POA: Diagnosis not present

## 2012-04-21 DIAGNOSIS — R109 Unspecified abdominal pain: Secondary | ICD-10-CM | POA: Diagnosis not present

## 2012-04-21 DIAGNOSIS — Z800 Family history of malignant neoplasm of digestive organs: Secondary | ICD-10-CM | POA: Diagnosis not present

## 2012-04-21 DIAGNOSIS — Z1211 Encounter for screening for malignant neoplasm of colon: Secondary | ICD-10-CM | POA: Diagnosis not present

## 2012-04-21 DIAGNOSIS — Z86010 Personal history of colonic polyps: Secondary | ICD-10-CM | POA: Diagnosis not present

## 2012-04-21 DIAGNOSIS — R1032 Left lower quadrant pain: Secondary | ICD-10-CM

## 2012-04-21 MED ORDER — SODIUM CHLORIDE 0.9 % IV SOLN: 500.0000 mL | INTRAVENOUS | Status: DC

## 2012-04-21 NOTE — Patient Instructions (Addendum)
YOU HAD AN ENDOSCOPIC PROCEDURE TODAY AT THE [facility]: Refer to the procedure report that was given to you for any specific questions about what was found during the examination.  If the procedure report does not answer your questions, please call your gastroenterologist to clarify.  If you requested that your care partner not be given the details of your procedure findings, then the procedure report has been included in a sealed envelope for you to review at your convenience later.  YOU SHOULD EXPECT: Some feelings of bloating in the abdomen. Passage of more gas than usual.  Walking can help get rid of the air that was put into your GI tract during the procedure and reduce the bloating. If you had a lower endoscopy (such as a colonoscopy or flexible sigmoidoscopy) you may notice spotting of blood in your stool or on the toilet paper. If you underwent a bowel prep for your procedure, then you may not have a normal bowel movement for a few days.  DIET: Your first meal following the procedure should be a light meal and then it is ok to progress to your normal diet.  A half-sandwich or bowl of soup is an example of a good first meal.  Heavy or fried foods are harder to digest and may make you feel nauseous or bloated.  Likewise meals heavy in dairy and vegetables can cause extra gas to form and this can also increase the bloating.  Drink plenty of fluids but you should avoid alcoholic beverages for 24 hours.  ACTIVITY: Your care partner should take you home directly after the procedure.  You should plan to take it easy, moving slowly for the rest of the day.  You can resume normal activity the day after the procedure however you should NOT DRIVE or use heavy machinery for 24 hours (because of the sedation medicines used during the test).    SYMPTOMS TO REPORT IMMEDIATELY: A gastroenterologist can be reached at any hour.  During normal business hours, 8:30 AM to 5:00 PM Monday through Friday,  call [phone].  After hours and on weekends, please call the GI answering service at [phone] who will take a message and have the physician on call contact you.   Following lower endoscopy (colonoscopy or flexible sigmoidoscopy):  Excessive amounts of blood in the stool  Significant tenderness or worsening of abdominal pains  Swelling of the abdomen that is new, acute  Fever of 100F or higher     FOLLOW UP: If any biopsies were taken you will be contacted by phone or by letter within the next 1-3 weeks.  Call your gastroenterologist if you have not heard about the biopsies in 3 weeks.  Our staff will call the home number listed on your records the next business day following your procedure to check on you and address any questions or concerns that you may have at that time regarding the information given to you following your procedure. This is a courtesy call and so if there is no answer at the home number and we have not heard from you through the emergency physician on call, we will assume that you have returned to your regular daily activities without incident.  SIGNATURES/CONFIDENTIALITY: You and/or your care partner have signed paperwork which will be entered into your electronic medical record.  These signatures attest to the fact that that the information above on your After Visit Summary has been reviewed and is understood.  Full responsibility of the  confidentiality of this discharge information lies with you and/or your care-partner.  Polyp, diverticulosis, high fiber diet,  soluablle fiber 2 teaspoons every day.  Miralax 9gm as needed.  No recall for colonoscopy due to age.

## 2012-04-21 NOTE — Op Note (Signed)
[facility] [address] [place], [address]   COLONOSCOPY PROCEDURE REPORT  PATIENT: Tamara [name], Tamara [name]  MR#: [id number] BIRTHDATE: July 04, 1927 , 84  yrs. old GENDER: Female ENDOSCOPIST: Hart Carwin, MD REFERRED BY:  Dr Dale Kennard PROCEDURE DATE:  04/21/2012 PROCEDURE:   Colonoscopy with cold biopsy polypectomy ASA CLASS:   Class II INDICATIONS:Patient's immediate family history of colon cancer, Patient's personal history of adenomatous colon polyps, abdominal pain in the lower left quadrant, and adenom.  polyp 2004, and 2007, last colon 2010- colitis, sister with colon cancer,. MEDICATIONS: MAC sedation, administered by CRNA and propofol (Diprivan) 350mg  IV  DESCRIPTION OF PROCEDURE:   After the risks and benefits and of the procedure were explained, informed consent was obtained.  A digital rectal exam revealed no abnormalities of the rectum.    The LB [id number]-[id number]  endoscope was introduced through the anus and advanced to the cecum, which was identified by both the appendix and ileocecal valve .  The quality of the prep was good, using MoviPrep .  The instrument was then slowly withdrawn as the colon was fully examined.     COLON FINDINGS: There was moderate diverticulosis noted in the sigmoid colon with associated angulation, tortuosity, muscular hypertrophy and colonic narrowing. There was a diminutive polyp at 10 cm x2, removed with cold forecepts    Retroflexed views revealed no abnormalities.     The scope was then withdrawn from the patient and the procedure completed.  COMPLICATIONS: There were no complications. ENDOSCOPIC IMPRESSION: There was moderate diverticulosis noted in the sigmoid colon , difficult exam due to tortuosity and hypertrophy in the sigmoid colon 2 diminutive polyps recto-sigmoid at 100 cm , removed LLQ abdominal pain likely caused by tortuous sigmoid colon and symptomatic diverticulosis  RECOMMENDATIONS: 1.  High  fiber diet 2.  Await pathology results 3. Soluable fiber 2 tsp daily 4. Miralax 9 gm prn   REPEAT EXAM: for No recall due to age..  cc:  _______________________________ eSignedHart Carwin, MD 04/21/2012 3:49 PM     PATIENT NAME:  Tamara [name] MR#: [id number]

## 2012-04-21 NOTE — Progress Notes (Signed)
Patient did not experience any of the following events: a burn prior to discharge; a fall within the facility; wrong site/side/patient/procedure/implant event; or a hospital transfer or hospital admission upon discharge from the facility. (G8907) Patient did not have preoperative order for IV antibiotic SSI prophylaxis. (G8918)  

## 2012-04-22 ENCOUNTER — Telehealth: Payer: Self-pay | Admitting: *Deleted

## 2012-04-22 NOTE — Telephone Encounter (Signed)
  Follow up Call-  Call back number 04/21/2012  Post procedure Call Back phone  # [phone]  Permission to leave phone message Yes     Patient questions:  Do you have a fever, pain , or abdominal swelling? no Pain Score  0 *  Have you tolerated food without any problems? yes  Have you been able to return to your normal activities? yes  Do you have any questions about your discharge instructions: Diet   no Medications  no Follow up visit  no  Do you have questions or concerns about your Care? no  Actions: * If pain score is 4 or above: No action needed, pain <4.

## 2012-04-23 ENCOUNTER — Encounter: Payer: Self-pay | Admitting: Internal Medicine

## 2012-04-23 ENCOUNTER — Ambulatory Visit (INDEPENDENT_AMBULATORY_CARE_PROVIDER_SITE_OTHER): Payer: Medicare Other | Admitting: Internal Medicine

## 2012-04-23 VITALS — BP 110/64 | HR 70 | Temp 97.7°F | Ht 65.0 in | Wt 146.2 lb

## 2012-04-23 DIAGNOSIS — R0989 Other specified symptoms and signs involving the circulatory and respiratory systems: Secondary | ICD-10-CM

## 2012-04-23 DIAGNOSIS — K219 Gastro-esophageal reflux disease without esophagitis: Secondary | ICD-10-CM | POA: Diagnosis not present

## 2012-04-23 DIAGNOSIS — F329 Major depressive disorder, single episode, unspecified: Secondary | ICD-10-CM

## 2012-04-23 DIAGNOSIS — E7800 Pure hypercholesterolemia, unspecified: Secondary | ICD-10-CM

## 2012-04-23 DIAGNOSIS — M899 Disorder of bone, unspecified: Secondary | ICD-10-CM

## 2012-04-23 DIAGNOSIS — I10 Essential (primary) hypertension: Secondary | ICD-10-CM | POA: Diagnosis not present

## 2012-04-23 DIAGNOSIS — M8580 Other specified disorders of bone density and structure, unspecified site: Secondary | ICD-10-CM

## 2012-04-23 NOTE — Progress Notes (Signed)
Subjective:    Patient ID: Tamara [name], female    DOB: March 23, 1927, 79 y.o.   MRN: [id number]  HPI 79 year old female with past history of hypercholesterolemia, reflux disease (previous H. Pylori), FCD, glaucoma and colitis previously maintained on Pentasa.  She comes in today to follow up on these issues as well as for a complete physical exam.  Has stopped her cholesterol medicine and her blood pressure medicine.  States she feels better off her medicine.  Does not want to restart.  States she can get her daughter to check her pressure.  Just had her colonoscopy two days ago.  States was told everything looked good.  Colon twisted.  Instructed to take stool softeners and miralax.  Some intermittent left lower quadrant pain.  States was told this was   Past Medical History  Diagnosis Date  . Hypertension   . Hypercholesterolemia   . GERD (gastroesophageal reflux disease)     EGD - gastritis/mild duodenitis, previous positive H. pylori  . Tubular adenoma of colon   . Fibrocystic breast disease   . Degenerative joint disease   . Osteopenia   . History of depression   . History of skin cancer     facial  . Vitamin D deficiency   . Chicken pox   . Glaucoma   . Migraines   . Diverticulitis   . Anemia   . Diabetes mellitus   . Helicobacter pylori ab+   . Colitis   . Internal hemorrhoids   . Arthritis   . IBS (irritable bowel syndrome)     Outpatient Encounter Prescriptions as of 04/23/2012  Medication Sig Dispense Refill  . acetaminophen (TYLENOL) 650 MG CR tablet 650 mg. 2 tablets bid prn      . Cholecalciferol (VITAMIN D3) 2000 UNITS TABS Take 1 tablet by mouth daily.      [name] losartan (COZAAR) 50 MG tablet Take 1 tablet (50 mg total) by mouth daily.  30 tablet  3  . PARoxetine (PAXIL) 20 MG tablet Take 20 mg by mouth every morning.      . pravastatin (PRAVACHOL) 40 MG tablet Take 20 mg by mouth daily.      . vitamin B-12 (CYANOCOBALAMIN) 100 MCG tablet Take 50 mcg by mouth daily.       [name] zolpidem (AMBIEN CR) 6.25 MG CR tablet Take 6.25 mg by mouth at bedtime as needed for sleep.       No facility-administered encounter medications on file as of 04/23/2012.    Review of Systems Patient denies any headache.  No significant lightheadedness or dizziness.  No sinus or allergy problems.   No chest pain, tightness or palpitations.  No increased shortness of breath, cough or congestion.  No nausea or vomiting.  No abdominal pain or cramping now, except for the pain with the hernia.  No BRBPR or melana.  No urine change.  Feels better off her medication.        Objective:   Physical Exam  Filed Vitals:   04/23/12 0838  BP: 110/64  Pulse: 70  Temp: 97.7 F (36.5 C)   Blood pressure recheck:  36/9  79 year old female in no acute distress.   HEENT:  Nares- clear.  Oropharynx - without lesions. NECK:  Supple.  Nontender.  No audible bruit.  HEART:  Appears to be regular. LUNGS:  No crackles or wheezing audible.  Respirations even and unlabored.  RADIAL PULSE:  Equal bilaterally.    BREASTS:  No nipple discharge or nipple retraction present.  Could not appreciate any distinct nodules or axillary adenopathy.  ABDOMEN:  Soft, nontender.  Bowel sounds present and normal.  No audible abdominal bruit.  GU:  Pt declined.    RECTAL: pt declined.   EXTREMITIES:  No increased edema present.  DP pulses palpable and equal bilaterally.            Assessment & Plan:  ABDOMINAL PAIN.  The previous flare has resolved.  Has a history of colitis.  Just evaluated by GI.  Had a colonoscopy two days ago.  States everything checked out fine.  Follow.    HERNIA. Saw Dr [name].  S/p repair.  Follow.    INCREASED PSYCHOSOCIAL STRESSORS.  Increased family stress.  On Paroxetine.  Continue to follow up with Dr Imogene Burn.  Doing better now.    CARDIOVASCULAR.  Stress test 02/25/07 revealed normal LV function with no evidence of any ischemia.  Known murmur.  Continue risk factor modification.   Currently asymptomatic.    CAROTID BRUIT.  Left carotid bruit.  Previous carotid ultrasound revealed no significant stenosis.  Continue daily aspirin.     HEALTH MAINTENANCE.  Physical today.  S/P hysterectomy.  Had mammogram.  Obtain results. Still have not received.

## 2012-04-26 ENCOUNTER — Encounter: Payer: Self-pay | Admitting: Internal Medicine

## 2012-04-26 NOTE — Assessment & Plan Note (Signed)
Previous carotid ultrasound revealed no significant stenosis.  Continue daily aspirin. Follow.

## 2012-04-26 NOTE — Assessment & Plan Note (Signed)
Symptoms controlled.  Follow.   

## 2012-04-26 NOTE — Assessment & Plan Note (Signed)
Calcium and vitamin D.  Weight bearing exercise.  Follow.

## 2012-04-26 NOTE — Assessment & Plan Note (Signed)
Off cholesterol medication and feeling better.  Wants to remain off.  Low cholesterol diet and exercise.  Follow.

## 2012-04-26 NOTE — Assessment & Plan Note (Signed)
Blood pressure as outlined.  Off medication.  Her daughter will check her blood pressure and she will notify me if elevated.  Follow.

## 2012-04-26 NOTE — Assessment & Plan Note (Signed)
Stable.  On paroxitine.

## 2012-04-27 ENCOUNTER — Encounter: Payer: Self-pay | Admitting: Internal Medicine

## 2012-06-01 DIAGNOSIS — G4700 Insomnia, unspecified: Secondary | ICD-10-CM | POA: Diagnosis not present

## 2012-06-01 DIAGNOSIS — F331 Major depressive disorder, recurrent, moderate: Secondary | ICD-10-CM | POA: Diagnosis not present

## 2012-06-11 DIAGNOSIS — H3530 Unspecified macular degeneration: Secondary | ICD-10-CM | POA: Diagnosis not present

## 2012-06-11 DIAGNOSIS — H26499 Other secondary cataract, unspecified eye: Secondary | ICD-10-CM | POA: Diagnosis not present

## 2012-06-29 DIAGNOSIS — G4700 Insomnia, unspecified: Secondary | ICD-10-CM | POA: Diagnosis not present

## 2012-06-29 DIAGNOSIS — F331 Major depressive disorder, recurrent, moderate: Secondary | ICD-10-CM | POA: Diagnosis not present

## 2012-09-21 ENCOUNTER — Inpatient Hospital Stay: Payer: Self-pay | Admitting: Internal Medicine

## 2012-09-21 DIAGNOSIS — Z9119 Patient's noncompliance with other medical treatment and regimen: Secondary | ICD-10-CM | POA: Diagnosis not present

## 2012-09-21 DIAGNOSIS — F411 Generalized anxiety disorder: Secondary | ICD-10-CM | POA: Diagnosis not present

## 2012-09-21 DIAGNOSIS — I6350 Cerebral infarction due to unspecified occlusion or stenosis of unspecified cerebral artery: Secondary | ICD-10-CM | POA: Diagnosis not present

## 2012-09-21 DIAGNOSIS — R209 Unspecified disturbances of skin sensation: Secondary | ICD-10-CM | POA: Diagnosis present

## 2012-09-21 DIAGNOSIS — I4891 Unspecified atrial fibrillation: Secondary | ICD-10-CM | POA: Diagnosis not present

## 2012-09-21 DIAGNOSIS — E785 Hyperlipidemia, unspecified: Secondary | ICD-10-CM | POA: Diagnosis not present

## 2012-09-21 DIAGNOSIS — M7989 Other specified soft tissue disorders: Secondary | ICD-10-CM | POA: Diagnosis not present

## 2012-09-21 DIAGNOSIS — R4701 Aphasia: Secondary | ICD-10-CM | POA: Diagnosis not present

## 2012-09-21 DIAGNOSIS — F329 Major depressive disorder, single episode, unspecified: Secondary | ICD-10-CM | POA: Diagnosis not present

## 2012-09-21 DIAGNOSIS — R946 Abnormal results of thyroid function studies: Secondary | ICD-10-CM | POA: Diagnosis present

## 2012-09-21 DIAGNOSIS — R5381 Other malaise: Secondary | ICD-10-CM | POA: Diagnosis not present

## 2012-09-21 DIAGNOSIS — I6340 Cerebral infarction due to embolism of unspecified cerebral artery: Secondary | ICD-10-CM | POA: Diagnosis not present

## 2012-09-21 DIAGNOSIS — I10 Essential (primary) hypertension: Secondary | ICD-10-CM | POA: Diagnosis not present

## 2012-09-21 LAB — COMPREHENSIVE METABOLIC PANEL
Albumin: 3.6 g/dL (ref 3.4–5.0)
Anion Gap: 5 — ABNORMAL LOW (ref 7–16)
Bilirubin,Total: 0.3 mg/dL (ref 0.2–1.0)
Calcium, Total: 9.2 mg/dL (ref 8.5–10.1)
Glucose: 94 mg/dL (ref 65–99)
Potassium: 3.8 mmol/L (ref 3.5–5.1)
SGOT(AST): 21 U/L (ref 15–37)
Total Protein: 7.4 g/dL (ref 6.4–8.2)

## 2012-09-21 LAB — CBC
HGB: 13.2 g/dL (ref 12.0–16.0)
MCV: 85 fL (ref 80–100)
RBC: 4.68 10*6/uL (ref 3.80–5.20)
WBC: 6.2 10*3/uL (ref 3.6–11.0)

## 2012-09-21 LAB — URINALYSIS, COMPLETE
Bacteria: NONE SEEN
Bilirubin,UR: NEGATIVE
Blood: NEGATIVE
Ketone: NEGATIVE
Leukocyte Esterase: NEGATIVE
Nitrite: NEGATIVE
Ph: 6 (ref 4.5–8.0)
Protein: NEGATIVE
RBC,UR: 1 /HPF (ref 0–5)
Specific Gravity: 1.009 (ref 1.003–1.030)

## 2012-09-21 LAB — TROPONIN I: Troponin-I: <0.02 ng/mL

## 2012-09-21 LAB — CK TOTAL AND CKMB (NOT AT ARMC): CK, Total: 65 U/L (ref 21–215)

## 2012-09-22 LAB — CBC WITH DIFFERENTIAL/PLATELET
Basophil #: 0 10*3/uL (ref 0.0–0.1)
Eosinophil #: 0.1 10*3/uL (ref 0.0–0.7)
HCT: 35.5 % (ref 35.0–47.0)
HGB: 12.1 g/dL (ref 12.0–16.0)
Lymphocyte %: 38.3 %
MCH: 28.9 pg (ref 26.0–34.0)
MCV: 84 fL (ref 80–100)
Monocyte %: 8.4 %
Neutrophil #: 2.9 10*3/uL (ref 1.4–6.5)
Neutrophil %: 50.8 %
Platelet: 318 10*3/uL (ref 150–440)
RDW: 14.3 % (ref 11.5–14.5)

## 2012-09-22 LAB — BASIC METABOLIC PANEL
Calcium, Total: 9 mg/dL (ref 8.5–10.1)
Co2: 27 mmol/L (ref 21–32)
EGFR (African American): >60

## 2012-09-22 LAB — LIPID PANEL
Cholesterol: 307 mg/dL — ABNORMAL HIGH (ref 0–200)
HDL Cholesterol: 59 mg/dL (ref 40–60)
Triglycerides: 86 mg/dL (ref 0–200)

## 2012-09-22 LAB — HEMOGLOBIN A1C: Hemoglobin A1C: 6.1 % (ref 4.2–6.3)

## 2012-09-25 ENCOUNTER — Encounter: Payer: Self-pay | Admitting: Internal Medicine

## 2012-09-25 ENCOUNTER — Ambulatory Visit (INDEPENDENT_AMBULATORY_CARE_PROVIDER_SITE_OTHER): Payer: Medicare Other | Admitting: Internal Medicine

## 2012-09-25 VITALS — BP 132/60 | HR 75 | Temp 98.1°F | Ht 65.0 in | Wt 147.5 lb

## 2012-09-25 DIAGNOSIS — I10 Essential (primary) hypertension: Secondary | ICD-10-CM

## 2012-09-25 DIAGNOSIS — I639 Cerebral infarction, unspecified: Secondary | ICD-10-CM

## 2012-09-25 DIAGNOSIS — R0989 Other specified symptoms and signs involving the circulatory and respiratory systems: Secondary | ICD-10-CM | POA: Diagnosis not present

## 2012-09-25 DIAGNOSIS — E7800 Pure hypercholesterolemia, unspecified: Secondary | ICD-10-CM | POA: Diagnosis not present

## 2012-09-25 DIAGNOSIS — I6350 Cerebral infarction due to unspecified occlusion or stenosis of unspecified cerebral artery: Secondary | ICD-10-CM | POA: Diagnosis not present

## 2012-09-25 MED ORDER — PRAVASTATIN SODIUM 40 MG PO TABS: 40.0000 mg | ORAL_TABLET | Freq: Every day | ORAL | Status: DC

## 2012-09-25 MED ORDER — LOSARTAN POTASSIUM 50 MG PO TABS: 50.0000 mg | ORAL_TABLET | Freq: Every day | ORAL | Status: DC

## 2012-09-28 ENCOUNTER — Encounter: Payer: Self-pay | Admitting: Internal Medicine

## 2012-09-28 DIAGNOSIS — Z8673 Personal history of transient ischemic attack (TIA), and cerebral infarction without residual deficits: Secondary | ICD-10-CM | POA: Insufficient documentation

## 2012-09-28 NOTE — Assessment & Plan Note (Signed)
Blood pressure under good control.  Continue current medication regimen.  Follow.  Follow metabolic panel.

## 2012-09-28 NOTE — Assessment & Plan Note (Signed)
Recent carotid ultrasound in the hospital revealed no hemodynamically significant stenosis.  Continue daily aspirin and risk factor modification.   

## 2012-09-28 NOTE — Progress Notes (Signed)
Subjective:    Patient ID: Tamara [name], female    DOB: 13-Apr-1927, 79 y.o.   MRN: [id number]  HPI 79 year old female with past history of hypercholesterolemia, reflux disease (previous H. Pylori), FCD, glaucoma and colitis previously maintained on Pentasa.  She comes in today for a hospital follow up.  Was admitted 09/21/12 with right arm tingling and expressive aphasia.  MRI revealed an abnormal signal in the left high posterior parietal area c/w acute ischemia.  Carotid ultrasound revealed no hemodynamically significant stenosis (per report).  ECHO revealed normal LV function with mild mitral valve and tricuspid valve regurgitation.  Symptoms improved.  Neurology consulted.  Recommended daily aspirin.  Pt reports a 325mg  aspirin upsets her stomach.  Unable to tolerate.  They were also concerned regarding the possibility of paroxysmal afib.  Had requested a 60 day event monitor.  Cardiology evaluated and recommended a follow up as an out patient.  Pt discharged on 09/22/12.  Has had no reoccurring symptoms since her discharge.  She has not tolerated the aspirin., but otherwise states she sees to be dong relatively well.   Does report some fatigue.  Accompanied by her daughter today.  History obtained form both of them.     Past Medical History  Diagnosis Date  . Hypertension   . Hypercholesterolemia   . GERD (gastroesophageal reflux disease)     EGD - gastritis/mild duodenitis, previous positive H. pylori  . Tubular adenoma of colon   . Fibrocystic breast disease   . Degenerative joint disease   . Osteopenia   . History of depression   . History of skin cancer     facial  . Vitamin D deficiency   . Chicken pox   . Glaucoma   . Migraines   . Diverticulitis   . Anemia   . Diabetes mellitus   . Helicobacter pylori ab+   . Colitis   . Internal hemorrhoids   . Arthritis   . IBS (irritable bowel syndrome)     Outpatient Encounter Prescriptions as of 09/25/2012  Medication Sig Dispense  Refill  . acetaminophen (TYLENOL) 650 MG CR tablet 650 mg. 2 tablets bid prn      . Cholecalciferol (VITAMIN D3) 2000 UNITS TABS Take 1 tablet by mouth daily.      [name] losartan (COZAAR) 50 MG tablet Take 1 tablet (50 mg total) by mouth daily.  90 tablet  3  . PARoxetine (PAXIL) 20 MG tablet Take 20 mg by mouth every morning.      . pravastatin (PRAVACHOL) 40 MG tablet Take 1 tablet (40 mg total) by mouth daily.  90 tablet  3  . zolpidem (AMBIEN CR) 6.25 MG CR tablet Take 6.25 mg by mouth at bedtime as needed for sleep.      . [DISCONTINUED] losartan (COZAAR) 50 MG tablet Take 1 tablet (50 mg total) by mouth daily.  30 tablet  3  . [DISCONTINUED] pravastatin (PRAVACHOL) 40 MG tablet Take 20 mg by mouth daily.      . [DISCONTINUED] vitamin B-12 (CYANOCOBALAMIN) 100 MCG tablet Take 50 mcg by mouth daily.       No facility-administered encounter medications on file as of 09/25/2012.    Review of Systems Patient denies any headache.  No significant lightheadedness or dizziness.  No sinus or allergy problems.   No chest pain, tightness or palpitations reported.   Apparently reported some intermittent palpitations (while in the hospital).  No increased shortness of breath, cough or congestion.  No nausea or vomiting.  No abdominal pain or cramping now, except for the pain with the hernia.  No BRBPR or melana.  No urine change.  Taking her medication regularly.  Having problems with the aspirin as outlined.        Objective:   Physical Exam  Filed Vitals:   09/25/12 1447  BP: 132/60  Pulse: 75  Temp: 98.1 F (36.7 C)   Blood pressure recheck:  132/68, pulse 46  79 year old female in no acute distress.   HEENT:  Nares- clear.  Oropharynx - without lesions. NECK:  Supple.  Nontender.  No audible bruit.  HEART:  Appears to be regular. LUNGS:  No crackles or wheezing audible.  Respirations even and unlabored.  RADIAL PULSE:  Equal bilaterally.  ABDOMEN:  Soft, nontender.  Bowel sounds present  and normal.  No audible abdominal bruit.  EXTREMITIES:  No increased edema present.  DP pulses palpable and equal bilaterally.            Assessment & Plan:  ABDOMINAL PAIN.  The previous flare has resolved.  Has a history of colitis.  Just evaluated by GI.  Had a recent colonoscopy.   States everything checked out fine.  Follow.    HERNIA. Saw Dr [name].  S/p repair.  Follow.    INCREASED PSYCHOSOCIAL STRESSORS.  Increased family stress.  On Paroxetine.  Continue to follow up with Dr Imogene Burn.  Doing better now.    CARDIOVASCULAR.  Stress test 02/25/07 revealed normal LV function with no evidence of any ischemia.  Known murmur.  Continue risk factor modification.  Recent cva.  Concern regarding the possibility of paroxysmal afib.  Will schedule a follow up with Dr Mariah Milling for evaluation.  Of note, ECHO in the hospital revealed normal hear function and no significant valve abnormality.  No thrombus.  Neurology had wanted a 60 day event monitory.  Unable to be placed prior to leaving the hospital.  Appears to be in SR today.    CAROTID BRUIT.  Left carotid bruit.  Previous carotid ultrasound revealed no significant stenosis.  Continue daily aspirin.     HEALTH MAINTENANCE.  Physical last visit.   S/P hysterectomy.  Had mammogram.  Need results.

## 2012-09-28 NOTE — Assessment & Plan Note (Signed)
On pravastatin.  Low cholesterol diet and exercise.  Follow lipid panel and liver function.    

## 2012-09-28 NOTE — Assessment & Plan Note (Signed)
Admitted with recent CVA.  Symptoms improved.  No problems with her speech now.  MRI, carotid ultrasound and ECHO as outlined.  325mg  upset her stomach.  Will decrease the dose, but want her to continue daily aspirin.  Follow.  Will need follow up with neurology.

## 2012-09-30 DIAGNOSIS — I6340 Cerebral infarction due to embolism of unspecified cerebral artery: Secondary | ICD-10-CM | POA: Diagnosis not present

## 2012-09-30 DIAGNOSIS — G4700 Insomnia, unspecified: Secondary | ICD-10-CM | POA: Diagnosis not present

## 2012-10-06 ENCOUNTER — Encounter: Payer: Self-pay | Admitting: Internal Medicine

## 2012-10-06 ENCOUNTER — Telehealth: Payer: Self-pay | Admitting: Internal Medicine

## 2012-10-06 MED ORDER — PRAVASTATIN SODIUM 40 MG PO TABS: 40.0000 mg | ORAL_TABLET | Freq: Every day | ORAL | Status: DC

## 2012-10-06 MED ORDER — LOSARTAN POTASSIUM 50 MG PO TABS: 50.0000 mg | ORAL_TABLET | Freq: Every day | ORAL | Status: DC

## 2012-10-06 NOTE — Telephone Encounter (Signed)
[facility] not [facility].  Please correct previous prescriptions sent on 8/29.

## 2012-10-06 NOTE — Telephone Encounter (Signed)
Pharmacy has been corrected. [facility] is known as [facility]. Resent Rx's to correct pharmacy

## 2012-10-12 ENCOUNTER — Encounter: Payer: Self-pay | Admitting: Cardiovascular Disease

## 2012-10-12 ENCOUNTER — Ambulatory Visit: Payer: Medicare Other | Admitting: Cardiovascular Disease

## 2012-10-12 ENCOUNTER — Ambulatory Visit (INDEPENDENT_AMBULATORY_CARE_PROVIDER_SITE_OTHER): Payer: Medicare Other | Admitting: Cardiovascular Disease

## 2012-10-12 VITALS — BP 158/78 | HR 69 | Ht 64.0 in | Wt 147.5 lb

## 2012-10-12 DIAGNOSIS — E7800 Pure hypercholesterolemia, unspecified: Secondary | ICD-10-CM

## 2012-10-12 DIAGNOSIS — I10 Essential (primary) hypertension: Secondary | ICD-10-CM

## 2012-10-12 DIAGNOSIS — R002 Palpitations: Secondary | ICD-10-CM

## 2012-10-12 DIAGNOSIS — I6350 Cerebral infarction due to unspecified occlusion or stenosis of unspecified cerebral artery: Secondary | ICD-10-CM

## 2012-10-12 DIAGNOSIS — I498 Other specified cardiac arrhythmias: Secondary | ICD-10-CM | POA: Insufficient documentation

## 2012-10-12 DIAGNOSIS — I639 Cerebral infarction, unspecified: Secondary | ICD-10-CM

## 2012-10-12 DIAGNOSIS — I4902 Ventricular flutter: Secondary | ICD-10-CM

## 2012-10-12 MED ORDER — CLOPIDOGREL BISULFATE 75 MG PO TABS: 75.0000 mg | ORAL_TABLET | Freq: Every day | ORAL | Status: DC

## 2012-10-12 NOTE — Progress Notes (Signed)
Patient ID: Tamara [name], female    DOB: Dec 05, 1927, 79 y.o.   MRN: [id number]  HPI Comments: Tamara [name] is a very pleasant 79 year old woman with hyperlipidemia, hypertension, presenting for evaluation after recent hospital admission for stroke. Mild carotid plaque bilaterally seen  Notes indicate she was admitted to the hospital August 25 and discharged 09/22/2012. Diagnosis of embolic infarct in the left parietal area with right hand paresthesias and expressive aphasia. Notes indicate no documented atrial fibrillation though significant concern given history of fluttering and palpitations. She was evaluated by cardiology in the hospital recommended outpatient workup. Discharge summary does not suggest documented atrial fibrillation in the hospital. It was recommended that she have an outpatient 30 day monitor.  Since her discharge, she reports that her residual deficits have returned to normal. She is feeling well with no complaints. She does report periodic episodes of fluttering in her chest. Uncertain how long these last She denies any significant shortness of breath, no lower from edema, no chest pain  Echocardiogram 09/22/2012 shows ejection fraction 55-60% mild mild MR and TR, normal right ventricular systolic pressures bubble study was not performed to look for PFO EKG in the hospital showed normal sinus rhythm with rate 61 beats per minute, essentially normal MRI of the brain showed abnormal signal in the left high posterior parietal region consistent with acute ischemia Carotid ultrasound showed no hemodynamic significance. There is calcified mural plaque identified on the right and left, less than 50% Lab work from the hospital showed total cholesterol 307, LDL 231, HDL 59  EKG shows normal sinus rhythm with rate 69 beats a minute, no significant ST or T wave changes   Outpatient Encounter Prescriptions as of 10/12/2012  Medication Sig Dispense Refill  . acetaminophen (TYLENOL)  650 MG CR tablet 650 mg. 2 tablets bid prn      . aspirin 81 MG tablet Take 81 mg by mouth daily.      . Cholecalciferol (VITAMIN D3 PO) Take 300 mg by mouth.      . losartan (COZAAR) 50 MG tablet Take 1 tablet (50 mg total) by mouth daily.  90 tablet  3  . PARoxetine (PAXIL) 20 MG tablet Take 20 mg by mouth every morning.      . pravastatin (PRAVACHOL) 40 MG tablet Take 1 tablet (40 mg total) by mouth daily.  90 tablet  3  . zolpidem (AMBIEN CR) 6.25 MG CR tablet Take 6.25 mg by mouth at bedtime as needed for sleep.      . clopidogrel (PLAVIX) 75 MG tablet Take 1 tablet (75 mg total) by mouth daily.  90 tablet  3  . [DISCONTINUED] Cholecalciferol (VITAMIN D3) 2000 UNITS TABS Take 1 tablet by mouth daily.       No facility-administered encounter medications on file as of 10/12/2012.     Review of Systems  Constitutional: Negative.   HENT: Negative.   Eyes: Negative.   Respiratory: Negative.   Cardiovascular: Negative.   Gastrointestinal: Negative.   Endocrine: Negative.   Musculoskeletal: Negative.   Skin: Negative.   Allergic/Immunologic: Negative.   Neurological: Negative.   Hematological: Negative.   Psychiatric/Behavioral: Negative.   All other systems reviewed and are negative.    BP 158/78  Pulse 69  Ht 5\' 4"  (1.626 m)  Wt 147 lb 8 oz (66.906 kg)  BMI 25.31 kg/m2  LMP 01/17/1972  Physical Exam  Nursing note and vitals reviewed. Constitutional: She is oriented to person, place, and time. She appears well-developed  and well-nourished.  HENT:  Head: Normocephalic.  Nose: Nose normal.  Mouth/Throat: Oropharynx is clear and moist.  Eyes: Conjunctivae are normal. Pupils are equal, round, and reactive to light.  Neck: Normal range of motion. Neck supple. No JVD present.  Cardiovascular: Normal rate, regular rhythm, S1 normal, S2 normal, normal heart sounds and intact distal pulses.  Exam reveals no gallop and no friction rub.   No murmur heard. Pulmonary/Chest: Effort  normal and breath sounds normal. No respiratory distress. She has no wheezes. She has no rales. She exhibits no tenderness.  Abdominal: Soft. Bowel sounds are normal. She exhibits no distension. There is no tenderness.  Musculoskeletal: Normal range of motion. She exhibits no edema and no tenderness.  Lymphadenopathy:    She has no cervical adenopathy.  Neurological: She is alert and oriented to person, place, and time. Coordination normal.  Skin: Skin is warm and dry. No rash noted. No erythema.  Psychiatric: She has a normal mood and affect. Her behavior is normal. Judgment and thought content normal.    Assessment and Plan

## 2012-10-12 NOTE — Assessment & Plan Note (Signed)
Blood pressure is well controlled on today's visit. No changes made to the medications. 

## 2012-10-12 NOTE — Patient Instructions (Addendum)
You are doing well. Please continue aspirin 81 mg daily Start plavix one a day  We will order a 30 monitor for CVA, palpitations, fluttering  Please call us if you have new issues that need to be addressed before your next appt.  Your physician wants you to follow-up in: 6 weeks

## 2012-10-12 NOTE — Assessment & Plan Note (Signed)
Recently started on pravastatin. Goal total cholesterol less than 150 given her underlying carotid arterial disease bilaterally. Suspect it may take a stronger medication such as Crestor, possibly even with zetia.

## 2012-10-12 NOTE — Assessment & Plan Note (Signed)
Uncertain if she is having underlying arrhythmia. 30 day monitor has been ordered. Also symptoms of palpitations. We'll not order any beta blockers at this time, we'll wait to see the results of her monitor

## 2012-10-12 NOTE — Assessment & Plan Note (Signed)
Etiology of her stroke is not clear. She does have bilateral calcified luminal disease in her carotids which could explain the embolic CVA. We have ordered a 30 day monitor to exclude atrial fibrillation. She continues to have palpitations and fluttering.

## 2012-10-14 DIAGNOSIS — I4902 Ventricular flutter: Secondary | ICD-10-CM

## 2012-10-20 ENCOUNTER — Telehealth: Payer: Self-pay | Admitting: *Deleted

## 2012-10-20 DIAGNOSIS — I4902 Ventricular flutter: Secondary | ICD-10-CM | POA: Diagnosis not present

## 2012-10-20 DIAGNOSIS — R739 Hyperglycemia, unspecified: Secondary | ICD-10-CM

## 2012-10-20 DIAGNOSIS — I10 Essential (primary) hypertension: Secondary | ICD-10-CM

## 2012-10-20 DIAGNOSIS — R7989 Other specified abnormal findings of blood chemistry: Secondary | ICD-10-CM

## 2012-10-20 DIAGNOSIS — I639 Cerebral infarction, unspecified: Secondary | ICD-10-CM

## 2012-10-20 DIAGNOSIS — E7800 Pure hypercholesterolemia, unspecified: Secondary | ICD-10-CM

## 2012-10-20 NOTE — Telephone Encounter (Signed)
Pt is coming in for labs 09.24.2014 what labs and dx?

## 2012-10-21 ENCOUNTER — Other Ambulatory Visit (INDEPENDENT_AMBULATORY_CARE_PROVIDER_SITE_OTHER): Payer: Medicare Other

## 2012-10-21 DIAGNOSIS — E7800 Pure hypercholesterolemia, unspecified: Secondary | ICD-10-CM | POA: Diagnosis not present

## 2012-10-21 DIAGNOSIS — R7309 Other abnormal glucose: Secondary | ICD-10-CM

## 2012-10-21 DIAGNOSIS — R946 Abnormal results of thyroid function studies: Secondary | ICD-10-CM

## 2012-10-21 DIAGNOSIS — I10 Essential (primary) hypertension: Secondary | ICD-10-CM

## 2012-10-21 DIAGNOSIS — I6350 Cerebral infarction due to unspecified occlusion or stenosis of unspecified cerebral artery: Secondary | ICD-10-CM | POA: Diagnosis not present

## 2012-10-21 DIAGNOSIS — I639 Cerebral infarction, unspecified: Secondary | ICD-10-CM

## 2012-10-21 DIAGNOSIS — R7989 Other specified abnormal findings of blood chemistry: Secondary | ICD-10-CM

## 2012-10-21 DIAGNOSIS — R739 Hyperglycemia, unspecified: Secondary | ICD-10-CM

## 2012-10-21 LAB — CBC WITH DIFFERENTIAL/PLATELET
Basophils Absolute: 0 10*3/uL (ref 0.0–0.1)
Basophils Relative: 0.4 % (ref 0.0–3.0)
Eosinophils Absolute: 0 10*3/uL (ref 0.0–0.7)
Eosinophils Relative: 0.7 % (ref 0.0–5.0)
Lymphocytes Relative: 24.4 % (ref 12.0–46.0)
MCHC: 33.2 g/dL (ref 30.0–36.0)
MCV: 84.9 fl (ref 78.0–100.0)
Monocytes Relative: 5.8 % (ref 3.0–12.0)
Neutro Abs: 4.2 10*3/uL (ref 1.4–7.7)
Neutrophils Relative %: 68.7 % (ref 43.0–77.0)
RBC: 4.67 Mil/uL (ref 3.87–5.11)
RDW: 14.4 % (ref 11.5–14.6)

## 2012-10-21 LAB — HEMOGLOBIN A1C: Hgb A1c MFr Bld: 6.2 % (ref 4.6–6.5)

## 2012-10-21 LAB — LIPID PANEL
Cholesterol: 221 mg/dL — ABNORMAL HIGH (ref 0–200)
HDL: 69.1 mg/dL (ref >39.00)
Triglycerides: 91 mg/dL (ref 0.0–149.0)
VLDL: 18.2 mg/dL (ref 0.0–40.0)

## 2012-10-21 LAB — HEPATIC FUNCTION PANEL
ALT: 12 U/L (ref 0–35)
AST: 18 U/L (ref 0–37)
Albumin: 3.9 g/dL (ref 3.5–5.2)
Bilirubin, Direct: 0 mg/dL (ref 0.0–0.3)
Total Bilirubin: 0.5 mg/dL (ref 0.3–1.2)

## 2012-10-21 LAB — BASIC METABOLIC PANEL
CO2: 28 mEq/L (ref 19–32)
Calcium: 9.5 mg/dL (ref 8.4–10.5)
Chloride: 107 mEq/L (ref 96–112)
Creatinine, Ser: 0.8 mg/dL (ref 0.4–1.2)
GFR: 76.14 mL/min (ref >60.00)
Sodium: 141 mEq/L (ref 135–145)

## 2012-10-21 LAB — LDL CHOLESTEROL, DIRECT: Direct LDL: 134.3 mg/dL

## 2012-10-21 NOTE — Telephone Encounter (Signed)
Orders placed for labs.  Thanks.

## 2012-10-26 ENCOUNTER — Ambulatory Visit (INDEPENDENT_AMBULATORY_CARE_PROVIDER_SITE_OTHER): Payer: Medicare Other | Admitting: Internal Medicine

## 2012-10-26 ENCOUNTER — Encounter: Payer: Self-pay | Admitting: Internal Medicine

## 2012-10-26 VITALS — BP 140/82 | HR 79 | Temp 98.4°F | Ht 65.0 in | Wt 146.8 lb

## 2012-10-26 DIAGNOSIS — F329 Major depressive disorder, single episode, unspecified: Secondary | ICD-10-CM

## 2012-10-26 DIAGNOSIS — M899 Disorder of bone, unspecified: Secondary | ICD-10-CM | POA: Diagnosis not present

## 2012-10-26 DIAGNOSIS — R0989 Other specified symptoms and signs involving the circulatory and respiratory systems: Secondary | ICD-10-CM | POA: Diagnosis not present

## 2012-10-26 DIAGNOSIS — I639 Cerebral infarction, unspecified: Secondary | ICD-10-CM

## 2012-10-26 DIAGNOSIS — R7309 Other abnormal glucose: Secondary | ICD-10-CM

## 2012-10-26 DIAGNOSIS — M8580 Other specified disorders of bone density and structure, unspecified site: Secondary | ICD-10-CM

## 2012-10-26 DIAGNOSIS — K219 Gastro-esophageal reflux disease without esophagitis: Secondary | ICD-10-CM

## 2012-10-26 DIAGNOSIS — R7989 Other specified abnormal findings of blood chemistry: Secondary | ICD-10-CM

## 2012-10-26 DIAGNOSIS — I10 Essential (primary) hypertension: Secondary | ICD-10-CM

## 2012-10-26 DIAGNOSIS — E7800 Pure hypercholesterolemia, unspecified: Secondary | ICD-10-CM | POA: Diagnosis not present

## 2012-10-26 DIAGNOSIS — I6350 Cerebral infarction due to unspecified occlusion or stenosis of unspecified cerebral artery: Secondary | ICD-10-CM | POA: Diagnosis not present

## 2012-10-26 DIAGNOSIS — R946 Abnormal results of thyroid function studies: Secondary | ICD-10-CM

## 2012-10-26 DIAGNOSIS — R739 Hyperglycemia, unspecified: Secondary | ICD-10-CM

## 2012-10-27 ENCOUNTER — Encounter: Payer: Self-pay | Admitting: Internal Medicine

## 2012-10-27 NOTE — Assessment & Plan Note (Signed)
On pravastatin.  Low cholesterol diet and exercise.  Follow lipid panel and liver function.  Cholesterol just checked and improved.  Discussed increasing the pravastatin.  She just started.  She is hesitant to increase.  Wants to continue for a few months and recheck cholesterol.  If persistent increase then, will discuss increasing the pravastatin.  She has had intolerance to lipitor and crestor.

## 2012-10-27 NOTE — Assessment & Plan Note (Signed)
Stable.  On paroxitine.

## 2012-10-27 NOTE — Progress Notes (Signed)
Subjective:    Patient ID: Tamara [name], female    DOB: 08/27/1927, 79 y.o.   MRN: [id number]  HPI 79 year old female with past history of hypercholesterolemia, reflux disease (previous H. Pylori), FCD, glaucoma and colitis previously maintained on Pentasa.  She comes in today for a scheduled follow up.  Was admitted 09/21/12 with right arm tingling and expressive aphasia.  MRI revealed an abnormal signal in the left high posterior parietal area c/w acute ischemia.  Carotid ultrasound revealed no hemodynamically significant stenosis (per report).  ECHO revealed normal LV function with mild mitral valve and tricuspid valve regurgitation.  Symptoms improved.  Neurology consulted.  Recommended daily aspirin.   They were also concerned regarding the possibility of paroxysmal afib.  Had requested a 60 day event monitor.  Cardiology evaluated and recommended a follow up as an out patient.  Pt discharged on 09/22/12.  Has had no reoccurring symptoms since her discharge.  She saw cardiology 10/12/12.  She is wearing a 30 day event monitor.  She has started walking.  Feels good.  Stays active.  Breathing stable.  Eating and drinking well.  Bowels doing well.    Past Medical History  Diagnosis Date  . Hypertension   . Hypercholesterolemia   . GERD (gastroesophageal reflux disease)     EGD - gastritis/mild duodenitis, previous positive H. pylori  . Tubular adenoma of colon   . Fibrocystic breast disease   . Degenerative joint disease   . Osteopenia   . History of depression   . History of skin cancer     facial  . Vitamin D deficiency   . Chicken pox   . Glaucoma   . Migraines   . Diverticulitis   . Anemia   . Diabetes mellitus   . Helicobacter pylori ab+   . Colitis   . Internal hemorrhoids   . Arthritis   . IBS (irritable bowel syndrome)   . Stroke   . Heart murmur     Outpatient Encounter Prescriptions as of 10/26/2012  Medication Sig Dispense Refill  . acetaminophen (TYLENOL) 650 MG CR  tablet 650 mg. 2 tablets bid prn      . aspirin 81 MG tablet Take 81 mg by mouth daily.      . Cholecalciferol (VITAMIN D3 PO) Take 300 mg by mouth.      . clopidogrel (PLAVIX) 75 MG tablet Take 1 tablet (75 mg total) by mouth daily.  90 tablet  3  . losartan (COZAAR) 50 MG tablet Take 1 tablet (50 mg total) by mouth daily.  90 tablet  3  . PARoxetine (PAXIL) 20 MG tablet Take 20 mg by mouth every morning.      . pravastatin (PRAVACHOL) 40 MG tablet Take 1 tablet (40 mg total) by mouth daily.  90 tablet  3  . zolpidem (AMBIEN CR) 6.25 MG CR tablet Take 6.25 mg by mouth at bedtime as needed for sleep.       No facility-administered encounter medications on file as of 10/26/2012.    Review of Systems Patient denies any headache.  No lightheadedness or dizziness.  No sinus or allergy problems.   No chest pain, tightness,  No increased shortness of breath, cough or congestion.  No nausea or vomiting.  No abdominal pain or cramping.  No BRBPR or melana.  No urine change.  Taking her medication regularly.  Tolerating the low dose aspirin.  Is exercising.  Feels good.  Blood pressure has been doing well.  On 25mg  of cozaar.         Objective:   Physical Exam  Filed Vitals:   10/26/12 1024  BP: 140/82  Pulse: 79  Temp: 98.4 F (36.9 C)   Blood pressure recheck:  142/78, pulse 59  79 year old female in no acute distress.   HEENT:  Nares- clear.  Oropharynx - without lesions. NECK:  Supple.  Nontender.  No audible bruit.  HEART:  Appears to be regular. LUNGS:  No crackles or wheezing audible.  Respirations even and unlabored.  RADIAL PULSE:  Equal bilaterally.  ABDOMEN:  Soft, nontender.  Bowel sounds present and normal.  No audible abdominal bruit.  EXTREMITIES:  No increased edema present.  DP pulses palpable and equal bilaterally.            Assessment & Plan:  ABDOMINAL PAIN.  Resolved.  Has a history of colitis.  Just evaluated by GI.  Had a recent colonoscopy.   States everything  checked out fine.  Follow.    HERNIA. Saw Dr [name].  S/p repair.  Follow.    INCREASED PSYCHOSOCIAL STRESSORS.  On Paroxetine.  Continue to follow up with Dr Imogene Burn.  Doing better now.    CARDIOVASCULAR.  Stress test 02/25/07 revealed normal LV function with no evidence of any ischemia.  Known murmur.  Continue risk factor modification.  Recent cva.  Concern regarding the possibility of paroxysmal afib.  Saw Dr Mariah Milling.  Is wearing a 30 day event monitor.  Of note, ECHO in the hospital revealed normal heart function and no significant valve abnormality.  No thrombus.     CAROTID BRUIT.  Left carotid bruit.  Previous carotid ultrasound revealed no significant stenosis.  Continue daily aspirin.     HEALTH MAINTENANCE.  Physical 04/23/12.     S/P hysterectomy.  Had mammogram.  Need results.

## 2012-10-27 NOTE — Assessment & Plan Note (Signed)
Admitted with recent CVA.   MRI, carotid ultrasound and ECHO as outlined.  Continue daily aspirin.  Keep follow up with neurology.    

## 2012-10-27 NOTE — Assessment & Plan Note (Signed)
Symptoms controlled.  Follow.   

## 2012-10-27 NOTE — Assessment & Plan Note (Addendum)
Blood pressure under good control.  Continue current medication regimen.  Follow.  Follow metabolic panel.  Will continue on 25mg  of cozaar for now.

## 2012-10-27 NOTE — Assessment & Plan Note (Signed)
Recent carotid ultrasound in the hospital revealed no hemodynamically significant stenosis.  Continue daily aspirin and risk factor modification.   

## 2012-10-27 NOTE — Assessment & Plan Note (Signed)
Calcium and vitamin D.  Weight bearing exercise.  Follow.

## 2012-11-20 ENCOUNTER — Telehealth: Payer: Self-pay

## 2012-11-20 NOTE — Telephone Encounter (Signed)
Left detailed message on pt's voicemail of monitor report, NSR w/ no significant arrythmia.   Asked pt to call with any questions or concerns.

## 2012-11-23 ENCOUNTER — Encounter: Payer: Self-pay | Admitting: Cardiovascular Disease

## 2012-11-23 ENCOUNTER — Ambulatory Visit (INDEPENDENT_AMBULATORY_CARE_PROVIDER_SITE_OTHER): Payer: Medicare Other | Admitting: Cardiovascular Disease

## 2012-11-23 VITALS — BP 122/74 | HR 63 | Ht 63.0 in | Wt 147.5 lb

## 2012-11-23 DIAGNOSIS — R531 Weakness: Secondary | ICD-10-CM

## 2012-11-23 DIAGNOSIS — F329 Major depressive disorder, single episode, unspecified: Secondary | ICD-10-CM

## 2012-11-23 DIAGNOSIS — I10 Essential (primary) hypertension: Secondary | ICD-10-CM

## 2012-11-23 DIAGNOSIS — R5381 Other malaise: Secondary | ICD-10-CM

## 2012-11-23 DIAGNOSIS — I639 Cerebral infarction, unspecified: Secondary | ICD-10-CM

## 2012-11-23 DIAGNOSIS — I6350 Cerebral infarction due to unspecified occlusion or stenosis of unspecified cerebral artery: Secondary | ICD-10-CM

## 2012-11-23 DIAGNOSIS — E7800 Pure hypercholesterolemia, unspecified: Secondary | ICD-10-CM

## 2012-11-23 DIAGNOSIS — R002 Palpitations: Secondary | ICD-10-CM | POA: Diagnosis not present

## 2012-11-23 DIAGNOSIS — I4902 Ventricular flutter: Secondary | ICD-10-CM

## 2012-11-23 DIAGNOSIS — I498 Other specified cardiac arrhythmias: Secondary | ICD-10-CM
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
## 2012-11-23 NOTE — Assessment & Plan Note (Signed)
I'm concerned about depression as a cause of her recent fatigue. She would like to decrease the losartan down to 25 mg daily. I think this would be fine and we have suggested she monitor her blood pressure. Recommend she restart her walking program as she felt better when walking.

## 2012-11-23 NOTE — Assessment & Plan Note (Signed)
She denies any significant long stretches of arrhythmia. Nothing seen on recent 30 day monitor. No further workup at this time.

## 2012-11-23 NOTE — Assessment & Plan Note (Signed)
Fully recovered from her stroke

## 2012-11-23 NOTE — Patient Instructions (Addendum)
You are doing well. Please cut the losartan in 1/2 daily Monitor your blood pressure If you continue to have fatigue, hold the losartan, call the office   Get back into your walking  Increase your fiber!! Daily!!  Please call us if you have new issues that need to be addressed before your next appt.  Your physician wants you to follow-up in: 12 months.  You will receive a reminder letter in the mail two months in advance. If you don't receive a letter, please call our office to schedule the follow-up appointment.

## 2012-11-23 NOTE — Assessment & Plan Note (Signed)
Dramatic improvement in her cholesterol. Recommended she increase her fiber/old female, increase her walking, in for total cholesterol less than 200.

## 2012-11-23 NOTE — Assessment & Plan Note (Signed)
We'll decrease her losartan down to 25 mg daily at her request. If blood pressure continues to be in a very good range, could potentially decrease the dose down even further if she continues to have fatigue.

## 2012-11-24 ENCOUNTER — Other Ambulatory Visit: Payer: Self-pay

## 2012-11-24 DIAGNOSIS — I4902 Ventricular flutter: Secondary | ICD-10-CM

## 2012-12-28 DIAGNOSIS — F331 Major depressive disorder, recurrent, moderate: Secondary | ICD-10-CM | POA: Diagnosis not present

## 2013-01-19 ENCOUNTER — Other Ambulatory Visit (INDEPENDENT_AMBULATORY_CARE_PROVIDER_SITE_OTHER): Payer: Medicare Other

## 2013-01-19 DIAGNOSIS — R739 Hyperglycemia, unspecified: Secondary | ICD-10-CM

## 2013-01-19 DIAGNOSIS — I10 Essential (primary) hypertension: Secondary | ICD-10-CM

## 2013-01-19 DIAGNOSIS — R7309 Other abnormal glucose: Secondary | ICD-10-CM

## 2013-01-19 DIAGNOSIS — E7800 Pure hypercholesterolemia, unspecified: Secondary | ICD-10-CM | POA: Diagnosis not present

## 2013-01-19 DIAGNOSIS — R946 Abnormal results of thyroid function studies: Secondary | ICD-10-CM | POA: Diagnosis not present

## 2013-01-19 DIAGNOSIS — R7989 Other specified abnormal findings of blood chemistry: Secondary | ICD-10-CM

## 2013-01-19 LAB — HEPATIC FUNCTION PANEL
ALT: 10 U/L (ref 0–35)
AST: 18 U/L (ref 0–37)
Albumin: 3.9 g/dL (ref 3.5–5.2)
Alkaline Phosphatase: 55 U/L (ref 39–117)
Total Protein: 6.9 g/dL (ref 6.0–8.3)

## 2013-01-19 LAB — BASIC METABOLIC PANEL
BUN: 14 mg/dL (ref 6–23)
Calcium: 9 mg/dL (ref 8.4–10.5)
Chloride: 107 mEq/L (ref 96–112)
Creatinine, Ser: 0.7 mg/dL (ref 0.4–1.2)
Potassium: 4.5 mEq/L (ref 3.5–5.1)

## 2013-01-19 LAB — LDL CHOLESTEROL, DIRECT: Direct LDL: 177.7 mg/dL

## 2013-01-19 LAB — LIPID PANEL
Cholesterol: 251 mg/dL — ABNORMAL HIGH (ref 0–200)
Total CHOL/HDL Ratio: 4
Triglycerides: 79 mg/dL (ref 0.0–149.0)
VLDL: 15.8 mg/dL (ref 0.0–40.0)

## 2013-01-26 ENCOUNTER — Ambulatory Visit (INDEPENDENT_AMBULATORY_CARE_PROVIDER_SITE_OTHER): Payer: Medicare Other | Admitting: Internal Medicine

## 2013-01-26 ENCOUNTER — Encounter: Payer: Self-pay | Admitting: Internal Medicine

## 2013-01-26 VITALS — BP 110/70 | HR 64 | Temp 98.1°F | Ht 65.0 in | Wt 146.8 lb

## 2013-01-26 DIAGNOSIS — M899 Disorder of bone, unspecified: Secondary | ICD-10-CM

## 2013-01-26 DIAGNOSIS — M8580 Other specified disorders of bone density and structure, unspecified site: Secondary | ICD-10-CM

## 2013-01-26 DIAGNOSIS — R0989 Other specified symptoms and signs involving the circulatory and respiratory systems: Secondary | ICD-10-CM | POA: Diagnosis not present

## 2013-01-26 DIAGNOSIS — K219 Gastro-esophageal reflux disease without esophagitis: Secondary | ICD-10-CM

## 2013-01-26 DIAGNOSIS — I6350 Cerebral infarction due to unspecified occlusion or stenosis of unspecified cerebral artery: Secondary | ICD-10-CM

## 2013-01-26 DIAGNOSIS — Z1239 Encounter for other screening for malignant neoplasm of breast: Secondary | ICD-10-CM | POA: Diagnosis not present

## 2013-01-26 DIAGNOSIS — I10 Essential (primary) hypertension: Secondary | ICD-10-CM

## 2013-01-26 DIAGNOSIS — R7309 Other abnormal glucose: Secondary | ICD-10-CM

## 2013-01-26 DIAGNOSIS — F329 Major depressive disorder, single episode, unspecified: Secondary | ICD-10-CM

## 2013-01-26 DIAGNOSIS — E7800 Pure hypercholesterolemia, unspecified: Secondary | ICD-10-CM | POA: Diagnosis not present

## 2013-01-26 DIAGNOSIS — R739 Hyperglycemia, unspecified: Secondary | ICD-10-CM

## 2013-01-26 DIAGNOSIS — I639 Cerebral infarction, unspecified: Secondary | ICD-10-CM

## 2013-01-26 MED ORDER — PRAVASTATIN SODIUM 80 MG PO TABS: 80.0000 mg | ORAL_TABLET | Freq: Every day | ORAL | Status: DC

## 2013-01-26 NOTE — Progress Notes (Signed)
Pre-visit discussion using our clinic review tool. No additional management support is needed unless otherwise documented below in the visit note.  

## 2013-01-26 NOTE — Progress Notes (Signed)
Subjective:    Patient ID: Tamara [name], female    DOB: May 28, 1927, 79 y.o.   MRN: [id number]  HPI 79 year old female with past history of hypercholesterolemia, reflux disease (previous H. Pylori), FCD, glaucoma and colitis previously maintained on Pentasa.  She comes in today for a scheduled follow up.  Was admitted 09/21/12 with right arm tingling and expressive aphasia.  MRI revealed an abnormal signal in the left high posterior parietal area c/w acute ischemia.  Carotid ultrasound revealed no hemodynamically significant stenosis (per report).  ECHO revealed normal LV function with mild mitral valve and tricuspid valve regurgitation.  Symptoms improved.  Neurology consulted.  Recommended daily aspirin.   They were also concerned regarding the possibility of paroxysmal afib.  Had requested a 60 day event monitor.  Cardiology evaluated and recommended a follow up as an out patient.  Pt discharged on 09/22/12.  Has had no reoccurring symptoms since her discharge.  She saw cardiology.  Wore a 30 day event monitor.  No significant arrythmia.  Feels good.  Stays active.  Breathing stable.  Eating and drinking well.  Bowels doing well.  Taking her medications regularly.  Cholesterol elevated.  Not watching what she eats.  Seeing Dr Imogene Burn.  Taking Buspar now prn.  On Lunesta.  Helping her sleep.  Feels better.     Past Medical History  Diagnosis Date  . Hypertension   . Hypercholesterolemia   . GERD (gastroesophageal reflux disease)     EGD - gastritis/mild duodenitis, previous positive H. pylori  . Tubular adenoma of colon   . Fibrocystic breast disease   . Degenerative joint disease   . Osteopenia   . History of depression   . History of skin cancer     facial  . Vitamin D deficiency   . Chicken pox   . Glaucoma   . Migraines   . Diverticulitis   . Anemia   . Diabetes mellitus   . Helicobacter pylori ab+   . Colitis   . Internal hemorrhoids   . Arthritis   . IBS (irritable bowel syndrome)    . Stroke   . Heart murmur     Outpatient Encounter Prescriptions as of 01/26/2013  Medication Sig  . aspirin 81 MG tablet Take 81 mg by mouth daily.  . busPIRone (BUSPAR) 15 MG tablet Take 7.5 mg by mouth daily as needed.  . Cholecalciferol (VITAMIN D3 PO) Take 300 mg by mouth.  . clopidogrel (PLAVIX) 75 MG tablet Take 1 tablet (75 mg total) by mouth daily.  . eszopiclone (LUNESTA) 2 MG TABS tablet Take 2 mg by mouth at bedtime as needed for sleep. Take immediately before bedtime  . losartan (COZAAR) 50 MG tablet Take 50 mg by mouth daily.   [name] PARoxetine (PAXIL) 20 MG tablet Take 20 mg by mouth every morning.  . pravastatin (PRAVACHOL) 40 MG tablet Take 1 tablet (40 mg total) by mouth daily.  . [DISCONTINUED] zolpidem (AMBIEN CR) 6.25 MG CR tablet Take 6.25 mg by mouth at bedtime as needed for sleep.    Review of Systems Patient denies any headache.  No lightheadedness or dizziness.  No sinus or allergy problems.   No chest pain, tightness. No increased shortness of breath, cough or congestion.  No nausea or vomiting.  No abdominal pain or cramping.  No BRBPR or melana.  No urine change.  Taking her medication regularly.  Tolerating the low dose aspirin.  Is exercising.  Feels good.  Blood pressure  has been doing well.  On 25mg  of cozaar.  Taking Buspar prn.  On Lunesta.  Helping her sleep.         Objective:   Physical Exam  Filed Vitals:   01/26/13 1113  BP: 110/70  Pulse: 64  Temp: 98.1 F (21.13 C)   79 year old female in no acute distress.   HEENT:  Nares- clear.  Oropharynx - without lesions. NECK:  Supple.  Nontender.  No audible bruit.  HEART:  Appears to be regular. LUNGS:  No crackles or wheezing audible.  Respirations even and unlabored.  RADIAL PULSE:  Equal bilaterally.  ABDOMEN:  Soft, nontender.  Bowel sounds present and normal.  No audible abdominal bruit.  EXTREMITIES:  No increased edema present.  DP pulses palpable and equal bilaterally.             Assessment & Plan:  ABDOMINAL PAIN.  Resolved.  Has a history of colitis.  Evaluated by GI.  Had a recent colonoscopy.   States everything checked out fine.  Follow.    HERNIA. Saw Dr [name].  S/p repair.  Follow.    INCREASED PSYCHOSOCIAL STRESSORS.  On Paroxetine.  Continue to follow up with Dr Imogene Burn.  Doing better now on Buspar prn.  Taking Lunesta to help her sleep.     CARDIOVASCULAR.  Stress test 02/25/07 revealed normal LV function with no evidence of any ischemia.  Known murmur.  Continue risk factor modification.  Recent cva.  Concern regarding the possibility of paroxysmal afib.  Saw Dr Mariah Milling.  Event monitor revealed no significant arrythmia.  Of note, ECHO in the hospital revealed normal heart function and no significant valve abnormality.  No thrombus.     CAROTID BRUIT.  Left carotid bruit.  Previous carotid ultrasound revealed no significant stenosis.  Continue daily aspirin.     HEALTH MAINTENANCE.  Physical 04/23/12.     S/P hysterectomy.  Schedule mammogram.

## 2013-01-28 ENCOUNTER — Encounter: Payer: Self-pay | Admitting: Internal Medicine

## 2013-01-28 NOTE — Assessment & Plan Note (Signed)
Admitted with recent CVA.   MRI, carotid ultrasound and ECHO as outlined.  Continue daily aspirin.  Keep follow up with neurology.    

## 2013-01-28 NOTE — Assessment & Plan Note (Signed)
Recent carotid ultrasound in the hospital revealed no hemodynamically significant stenosis.  Continue daily aspirin and risk factor modification.   

## 2013-01-28 NOTE — Assessment & Plan Note (Signed)
Symptoms controlled.  Follow.   

## 2013-01-28 NOTE — Assessment & Plan Note (Signed)
Doing better.  On paroxitine.  Now taking buspar prn.  Has Lunesta to help him sleep.

## 2013-01-28 NOTE — Assessment & Plan Note (Signed)
On pravastatin.  Low cholesterol diet and exercise.  Follow lipid panel and liver function.  Cholesterol just checked.  Elevated.    Discussed increasing the pravastatin.  She has had intolerance to lipitor and crestor.  Will increase to 80mg  q day.  Follow.

## 2013-01-28 NOTE — Assessment & Plan Note (Signed)
Vitamin D.  Weight bearing exercise.  Follow.    

## 2013-01-28 NOTE — Assessment & Plan Note (Signed)
Blood pressure has been under good control.  Continue current medication regimen.  Follow.  Follow metabolic panel.  Will continue on 25mg  of cozaar.

## 2013-02-02 DIAGNOSIS — F331 Major depressive disorder, recurrent, moderate: Secondary | ICD-10-CM | POA: Diagnosis not present

## 2013-02-02 DIAGNOSIS — G4700 Insomnia, unspecified: Secondary | ICD-10-CM | POA: Diagnosis not present

## 2013-02-09 ENCOUNTER — Ambulatory Visit: Payer: Self-pay | Admitting: Internal Medicine

## 2013-02-09 ENCOUNTER — Encounter: Payer: Self-pay | Admitting: Internal Medicine

## 2013-02-09 DIAGNOSIS — Z1231 Encounter for screening mammogram for malignant neoplasm of breast: Secondary | ICD-10-CM | POA: Diagnosis not present

## 2013-02-09 LAB — HM MAMMOGRAPHY: HM MAMMO: NEGATIVE

## 2013-04-27 DIAGNOSIS — F331 Major depressive disorder, recurrent, moderate: Secondary | ICD-10-CM | POA: Diagnosis not present

## 2013-04-27 DIAGNOSIS — G4700 Insomnia, unspecified: Secondary | ICD-10-CM | POA: Diagnosis not present

## 2013-05-03 ENCOUNTER — Other Ambulatory Visit: Payer: Medicare Other

## 2013-05-04 ENCOUNTER — Other Ambulatory Visit (INDEPENDENT_AMBULATORY_CARE_PROVIDER_SITE_OTHER): Payer: Medicare Other

## 2013-05-04 DIAGNOSIS — E7800 Pure hypercholesterolemia, unspecified: Secondary | ICD-10-CM

## 2013-05-04 DIAGNOSIS — I10 Essential (primary) hypertension: Secondary | ICD-10-CM

## 2013-05-04 DIAGNOSIS — R7309 Other abnormal glucose: Secondary | ICD-10-CM | POA: Diagnosis not present

## 2013-05-04 DIAGNOSIS — R739 Hyperglycemia, unspecified: Secondary | ICD-10-CM

## 2013-05-04 LAB — BASIC METABOLIC PANEL
BUN: 17 mg/dL (ref 6–23)
CHLORIDE: 106 meq/L (ref 96–112)
CO2: 28 meq/L (ref 19–32)
Calcium: 9.3 mg/dL (ref 8.4–10.5)
Creatinine, Ser: 0.7 mg/dL (ref 0.4–1.2)
GFR: 84.88 mL/min (ref >60.00)
Glucose, Bld: 94 mg/dL (ref 70–99)
Potassium: 4.3 mEq/L (ref 3.5–5.1)
Sodium: 140 mEq/L (ref 135–145)

## 2013-05-04 LAB — LIPID PANEL
CHOL/HDL RATIO: 4
Cholesterol: 276 mg/dL — ABNORMAL HIGH (ref 0–200)
HDL: 69 mg/dL (ref >39.00)
LDL CALC: 191 mg/dL — AB (ref 0–99)
Triglycerides: 82 mg/dL (ref 0.0–149.0)
VLDL: 16.4 mg/dL (ref 0.0–40.0)

## 2013-05-04 LAB — HEPATIC FUNCTION PANEL
ALT: 13 U/L (ref 0–35)
AST: 17 U/L (ref 0–37)
Albumin: 3.7 g/dL (ref 3.5–5.2)
Alkaline Phosphatase: 62 U/L (ref 39–117)
BILIRUBIN DIRECT: 0 mg/dL (ref 0.0–0.3)
Total Bilirubin: 0.6 mg/dL (ref 0.3–1.2)
Total Protein: 6.9 g/dL (ref 6.0–8.3)

## 2013-05-04 LAB — HEMOGLOBIN A1C: Hgb A1c MFr Bld: 6 % (ref 4.6–6.5)

## 2013-05-06 ENCOUNTER — Ambulatory Visit (INDEPENDENT_AMBULATORY_CARE_PROVIDER_SITE_OTHER): Payer: Medicare Other | Admitting: Internal Medicine

## 2013-05-06 ENCOUNTER — Encounter: Payer: Self-pay | Admitting: Internal Medicine

## 2013-05-06 VITALS — BP 156/60 | HR 79 | Temp 98.6°F | Ht 63.5 in | Wt 145.0 lb

## 2013-05-06 DIAGNOSIS — I639 Cerebral infarction, unspecified: Secondary | ICD-10-CM

## 2013-05-06 DIAGNOSIS — R5383 Other fatigue: Secondary | ICD-10-CM | POA: Diagnosis not present

## 2013-05-06 DIAGNOSIS — I6350 Cerebral infarction due to unspecified occlusion or stenosis of unspecified cerebral artery: Secondary | ICD-10-CM | POA: Diagnosis not present

## 2013-05-06 DIAGNOSIS — R0989 Other specified symptoms and signs involving the circulatory and respiratory systems: Secondary | ICD-10-CM

## 2013-05-06 DIAGNOSIS — R5381 Other malaise: Secondary | ICD-10-CM

## 2013-05-06 DIAGNOSIS — F329 Major depressive disorder, single episode, unspecified: Secondary | ICD-10-CM

## 2013-05-06 DIAGNOSIS — F3289 Other specified depressive episodes: Secondary | ICD-10-CM

## 2013-05-06 DIAGNOSIS — I10 Essential (primary) hypertension: Secondary | ICD-10-CM

## 2013-05-06 DIAGNOSIS — K219 Gastro-esophageal reflux disease without esophagitis: Secondary | ICD-10-CM

## 2013-05-06 DIAGNOSIS — E7800 Pure hypercholesterolemia, unspecified: Secondary | ICD-10-CM | POA: Diagnosis not present

## 2013-05-06 DIAGNOSIS — F32A Depression, unspecified: Secondary | ICD-10-CM

## 2013-05-06 LAB — CBC WITH DIFFERENTIAL/PLATELET
Basophils Absolute: 0 10*3/uL (ref 0.0–0.1)
Basophils Relative: 0.5 % (ref 0.0–3.0)
EOS ABS: 0 10*3/uL (ref 0.0–0.7)
EOS PCT: 0.7 % (ref 0.0–5.0)
HCT: 35.8 % — ABNORMAL LOW (ref 36.0–46.0)
Hemoglobin: 11.8 g/dL — ABNORMAL LOW (ref 12.0–15.0)
Lymphocytes Relative: 22.6 % (ref 12.0–46.0)
Lymphs Abs: 1.3 10*3/uL (ref 0.7–4.0)
MCHC: 33 g/dL (ref 30.0–36.0)
MCV: 86.3 fl (ref 78.0–100.0)
Monocytes Absolute: 0.4 10*3/uL (ref 0.1–1.0)
Monocytes Relative: 6.5 % (ref 3.0–12.0)
NEUTROS ABS: 4.1 10*3/uL (ref 1.4–7.7)
Neutrophils Relative %: 69.7 % (ref 43.0–77.0)
Platelets: 355 10*3/uL (ref 150.0–400.0)
RBC: 4.15 Mil/uL (ref 3.87–5.11)
RDW: 14.3 % (ref 11.5–14.6)
WBC: 5.9 10*3/uL (ref 4.5–10.5)

## 2013-05-06 LAB — TSH: TSH: 3.15 u[IU]/mL (ref 0.35–5.50)

## 2013-05-06 NOTE — Progress Notes (Signed)
Pre visit review using our clinic review tool, if applicable. No additional management support is needed unless otherwise documented below in the visit note. 

## 2013-05-07 ENCOUNTER — Other Ambulatory Visit: Payer: Self-pay | Admitting: Internal Medicine

## 2013-05-07 DIAGNOSIS — D649 Anemia, unspecified: Secondary | ICD-10-CM

## 2013-05-07 NOTE — Progress Notes (Signed)
Orders placed for f/u labs.  

## 2013-05-09 ENCOUNTER — Encounter: Payer: Self-pay | Admitting: Internal Medicine

## 2013-05-09 DIAGNOSIS — R5383 Other fatigue: Secondary | ICD-10-CM | POA: Insufficient documentation

## 2013-05-09 NOTE — Assessment & Plan Note (Signed)
Doing better.  On paroxitine.  Has buspar to take prn.  Has Lunesta to help her sleep.    

## 2013-05-09 NOTE — Assessment & Plan Note (Signed)
Blood pressure has been under good control.  Instructed her on the importance of taking her medication regularly.  Follow.  Follow metabolic panel.   

## 2013-05-09 NOTE — Assessment & Plan Note (Signed)
Remain off cholesterol medication.  Check cbc and tsh.

## 2013-05-09 NOTE — Assessment & Plan Note (Signed)
Recent carotid ultrasound in the hospital revealed no hemodynamically significant stenosis.  Continue daily aspirin and risk factor modification.   

## 2013-05-09 NOTE — Assessment & Plan Note (Signed)
Symptoms controlled.  Follow.   

## 2013-05-09 NOTE — Assessment & Plan Note (Signed)
Was on pravastatin.  Stopped secondary to joint aches and fatigue.  Low cholesterol diet and exercise.  Follow lipid panel and liver function.  Cholesterol just checked.  Elevated.   She has had intolerance to lipitor, crestor and now pravastatin.  Will have her remain off her cholesterol medication.  See if symptoms completely resolve.  If so, restart a different cholesterol medication.

## 2013-05-09 NOTE — Assessment & Plan Note (Signed)
Admitted with recent CVA.   MRI, carotid ultrasound and ECHO as outlined.  Continue daily aspirin.  Keep follow up with neurology.    

## 2013-05-09 NOTE — Progress Notes (Signed)
Subjective:    Patient ID: Tamara [name], female    DOB: 1927-09-07, 80 y.o.   MRN: [id number]  HPI 80 year old female with past history of hypercholesterolemia, reflux disease (previous H. Pylori), FCD, glaucoma and colitis previously maintained on Pentasa.  She comes in today to follow up on these issues as well as for a complete physical exam.  Was admitted 09/21/12 with right arm tingling and expressive aphasia.  MRI revealed an abnormal signal in the left high posterior parietal area c/w acute ischemia.  Carotid ultrasound revealed no hemodynamically significant stenosis (per report).  ECHO revealed normal LV function with mild mitral valve and tricuspid valve regurgitation.  Symptoms improved.  Neurology consulted.  Recommended daily aspirin.   They were also concerned regarding the possibility of paroxysmal afib.  Had requested a 60 day event monitor.  Cardiology evaluated and recommended a follow up as an out patient.  Pt discharged on 09/22/12.  Has had no reoccurring symptoms since her discharge.  She saw cardiology.  Wore a 30 day event monitor.  No significant arrythmia.  Feels good.  Stays active.  Breathing stable.  Eating and drinking well.  Bowels doing well.  She is off her cholesterol medication.  She stopped it secondary to increased aches and fatigue.  Cholesterol elevated.  Not watching what she eats.  Seeing Dr Bridgett Larsson.  Has Buspar now to take prn.  On Lunesta.  Helping her sleep.  Feels some better since stopping her cholesterol medication.     Past Medical History  Diagnosis Date  . Hypertension   . Hypercholesterolemia   . GERD (gastroesophageal reflux disease)     EGD - gastritis/mild duodenitis, previous positive H. pylori  . Tubular adenoma of colon   . Fibrocystic breast disease   . Degenerative joint disease   . Osteopenia   . History of depression   . History of skin cancer     facial  . Vitamin D deficiency   . Chicken pox   . Glaucoma   . Migraines   .  Diverticulitis   . Anemia   . Diabetes mellitus   . Helicobacter pylori ab+   . Colitis   . Internal hemorrhoids   . Arthritis   . IBS (irritable bowel syndrome)   . Stroke   . Heart murmur     Outpatient Encounter Prescriptions as of 05/06/2013  Medication Sig  . aspirin 81 MG tablet Take 81 mg by mouth daily.  . clopidogrel (PLAVIX) 75 MG tablet Take 1 tablet (75 mg total) by mouth daily.  . eszopiclone (LUNESTA) 2 MG TABS tablet Take 2 mg by mouth at bedtime as needed for sleep. Take immediately before bedtime  . losartan (COZAAR) 50 MG tablet Take 50 mg by mouth daily.   [name] PARoxetine (PAXIL) 30 MG tablet Take 30 mg by mouth daily.  . pravastatin (PRAVACHOL) 80 MG tablet Take 1 tablet (80 mg total) by mouth daily.  . busPIRone (BUSPAR) 15 MG tablet Take 7.5 mg by mouth daily as needed.  . Cholecalciferol (VITAMIN D3 PO) Take 300 mg by mouth.  . [DISCONTINUED] PARoxetine (PAXIL) 20 MG tablet Take 20 mg by mouth every morning.    Review of Systems Patient denies any headache.  No lightheadedness or dizziness.  No sinus or allergy problems.   No chest pain, tightness. No increased shortness of breath, cough or congestion.  No nausea or vomiting.  No abdominal pain or cramping.  No BRBPR or melana.  No  urine change.  Tolerating the low dose aspirin.  On Lunesta.  Helping her sleep.  Reports increased joint aching and fatigue.  Feels some better since stopping the cholesterol medication.  Varies the dose of her blood pressure medication.         Objective:   Physical Exam  Filed Vitals:   05/06/13 1332  BP: 156/60  Pulse: 79  Temp: 98.6 F (37 C)   Blood pressure recheck:  16/50  80 year old female in no acute distress.   HEENT:  Nares- clear.  Oropharynx - without lesions. NECK:  Supple.  Nontender.  No audible bruit.  HEART:  Appears to be regular. LUNGS:  No crackles or wheezing audible.  Respirations even and unlabored.  RADIAL PULSE:  Equal bilaterally.    BREASTS:   No nipple discharge or nipple retraction present.  Could not appreciate any distinct nodules or axillary adenopathy.  ABDOMEN:  Soft, nontender.  Bowel sounds present and normal.  No audible abdominal bruit.  GU:  Not performed.     EXTREMITIES:  No increased edema present.  DP pulses palpable and equal bilaterally.            Assessment & Plan:  ABDOMINAL PAIN.  Resolved.  Has a history of colitis.  Evaluated by GI.  Had a recent colonoscopy.   States everything checked out fine.  Follow.    HERNIA. Saw Dr Tamala Julian.  S/p repair.  Follow.    INCREASED PSYCHOSOCIAL STRESSORS.  On Paroxetine.  Continue to follow up with Dr Bridgett Larsson.  Doing better now on Buspar prn.  Taking Lunesta to help her sleep.     CARDIOVASCULAR.  Stress test 02/25/07 revealed normal LV function with no evidence of any ischemia.  Known murmur.  Continue risk factor modification.  Recent cva.  Concern regarding the possibility of paroxysmal afib.  Saw Dr Rockey Situ.  Event monitor revealed no significant arrythmia.  Of note, ECHO in the hospital revealed normal heart function and no significant valve abnormality.  No thrombus.     CAROTID BRUIT.  Left carotid bruit.  Previous carotid ultrasound revealed no significant stenosis.  Continue daily aspirin.     HEALTH MAINTENANCE.  Physical today.     S/P hysterectomy.  Mammogram 01/30/13 birads I.

## 2013-05-11 ENCOUNTER — Encounter: Payer: Self-pay | Admitting: Cardiovascular Disease

## 2013-05-11 ENCOUNTER — Ambulatory Visit (INDEPENDENT_AMBULATORY_CARE_PROVIDER_SITE_OTHER): Payer: Medicare Other | Admitting: Cardiovascular Disease

## 2013-05-11 VITALS — BP 150/80 | HR 70 | Ht 64.0 in | Wt 147.5 lb

## 2013-05-11 DIAGNOSIS — R5383 Other fatigue: Secondary | ICD-10-CM | POA: Diagnosis not present

## 2013-05-11 DIAGNOSIS — R5381 Other malaise: Secondary | ICD-10-CM

## 2013-05-11 DIAGNOSIS — R0989 Other specified symptoms and signs involving the circulatory and respiratory systems: Secondary | ICD-10-CM

## 2013-05-11 DIAGNOSIS — M949 Disorder of cartilage, unspecified: Secondary | ICD-10-CM

## 2013-05-11 DIAGNOSIS — E7800 Pure hypercholesterolemia, unspecified: Secondary | ICD-10-CM

## 2013-05-11 DIAGNOSIS — M899 Disorder of bone, unspecified: Secondary | ICD-10-CM

## 2013-05-11 DIAGNOSIS — M8580 Other specified disorders of bone density and structure, unspecified site: Secondary | ICD-10-CM

## 2013-05-11 DIAGNOSIS — I6350 Cerebral infarction due to unspecified occlusion or stenosis of unspecified cerebral artery: Secondary | ICD-10-CM | POA: Diagnosis not present

## 2013-05-11 DIAGNOSIS — F3289 Other specified depressive episodes: Secondary | ICD-10-CM

## 2013-05-11 DIAGNOSIS — F32A Depression, unspecified: Secondary | ICD-10-CM

## 2013-05-11 DIAGNOSIS — I498 Other specified cardiac arrhythmias: Secondary | ICD-10-CM

## 2013-05-11 DIAGNOSIS — K219 Gastro-esophageal reflux disease without esophagitis: Secondary | ICD-10-CM

## 2013-05-11 DIAGNOSIS — I4902 Ventricular flutter: Secondary | ICD-10-CM

## 2013-05-11 DIAGNOSIS — I10 Essential (primary) hypertension: Secondary | ICD-10-CM

## 2013-05-11 DIAGNOSIS — F329 Major depressive disorder, single episode, unspecified: Secondary | ICD-10-CM

## 2013-05-11 DIAGNOSIS — I639 Cerebral infarction, unspecified: Secondary | ICD-10-CM

## 2013-05-11 NOTE — Assessment & Plan Note (Signed)
Currently not able to tolerate a cholesterol medication

## 2013-05-11 NOTE — Assessment & Plan Note (Signed)
Less likely cardiac etiology given symptoms at rest, no significant symptoms with exertion. Recommended she talk as above with Dr. Nicki Reaper and Bridgett Larsson. Uncertain if she needs medication changes or therapy. Recommended daily walking for exercise

## 2013-05-11 NOTE — Assessment & Plan Note (Signed)
She is weaning herself down on the losartan. I do not think this is causing her fatigue symptoms. Suggested she closely monitor her blood pressure

## 2013-05-11 NOTE — Assessment & Plan Note (Signed)
She has significant fatigue, spending long periods of time in bed. She feels it could be from her medications but there are hardly anymore medications to hold. Suggested she talk with Dr. Nicki Reaper and Bridgett Larsson about further medication options, even possible therapy. Recommended she start any regular exercise walking program

## 2013-05-11 NOTE — Progress Notes (Signed)
Patient ID: Tamara [name], female    DOB: [birth year], 80 y.o.   MRN: [id number]  HPI Comments: Tamara [name] is a very pleasant 80 year old woman with hyperlipidemia, hypertension,  Previous Stroke . Mild carotid plaque bilaterally seen   admitted to the hospital August 25 and discharged 09/22/2012. Diagnosis of embolic infarct in the left parietal area with right hand paresthesias and expressive aphasia. Notes indicate no documented atrial fibrillation though significant concern given history of fluttering and palpitations.  30 day Holter did not show any significant arrhythmia  residual deficits have returned to normal.   She reports having episodes of severe fatigue, typically at rest, sometimes with exertion. Daughter who presents with her today reports that she has been active, typically with no chest pain, shortness of breath, or fatigue symptoms She has stopped her statin, decrease the doses of many of her medications and still reports having fatigue She denies any significant shortness of breath, no lower from edema, no chest pain She reports having myalgias on pravastatin and this was held last week. Daughter reports that she was bending several days in bed at a time. After long discussion with the patient and her daughter, daughter feels her symptoms could be from internalizing all the stressors from last year. Possible depression. Uncertain if the Paxil is working anymore  Echocardiogram 09/22/2012 shows ejection fraction 55-60% mild mild MR and TR, normal right ventricular systolic pressures bubble study was not performed to look for PFO EKG in the hospital showed normal sinus rhythm with rate 61 beats per minute, essentially normal MRI of the brain showed abnormal signal in the left high posterior parietal region consistent with acute ischemia Carotid ultrasound showed no hemodynamic significance. There is calcified mural plaque identified on the right and left, less than 50%  EKG  shows normal sinus rhythm with rate 70 beats a minute, no significant ST or T wave changes   Outpatient Encounter Prescriptions as of 05/11/2013  Medication Sig  . aspirin 81 MG tablet Take 81 mg by mouth daily.  . clopidogrel (PLAVIX) 75 MG tablet Take 1 tablet (75 mg total) by mouth daily.  . eszopiclone (LUNESTA) 2 MG TABS tablet Take 2 mg by mouth at bedtime as needed for sleep. Take immediately before bedtime  . losartan (COZAAR) 50 MG tablet Take 50 mg by mouth daily.  Now taking 25 mg every other day  . PARoxetine (PAXIL) 30 MG tablet Take 30 mg by mouth daily.  . busPIRone (BUSPAR) 15 MG tablet Take 7.5 mg by mouth daily as needed.  . Cholecalciferol (VITAMIN D3 PO) Take 300 mg by mouth.  . pravastatin (PRAVACHOL) 80 MG tablet Take 1 tablet (80 mg total) by mouth daily. She has stopped taking this      Review of Systems  Constitutional: Positive for fatigue.  HENT: Negative.   Eyes: Negative.   Respiratory: Negative.   Cardiovascular: Negative.   Gastrointestinal: Negative.   Endocrine: Negative.   Musculoskeletal: Negative.   Skin: Negative.   Allergic/Immunologic: Negative.   Neurological: Negative.   Hematological: Negative.   Psychiatric/Behavioral: Positive for dysphoric mood.  All other systems reviewed and are negative.   BP 150/80  Pulse 70  Ht 5\' 4"  (1.626 m)  Wt 147 lb 8 oz (66.906 kg)  BMI 25.31 kg/m2  LMP 01/17/1972  Physical Exam  Nursing note and vitals reviewed. Constitutional: She is oriented to person, place, and time. She appears well-developed and well-nourished.  HENT:  Head: Normocephalic.  Nose: Nose normal.  Mouth/Throat: Oropharynx is clear and moist.  Eyes: Conjunctivae are normal. Pupils are equal, round, and reactive to light.  Neck: Normal range of motion. Neck supple. No JVD present.  Cardiovascular: Normal rate, regular rhythm, S1 normal, S2 normal, normal heart sounds and intact distal pulses.  Exam reveals no gallop and no  friction rub.   No murmur heard. Pulmonary/Chest: Effort normal and breath sounds normal. No respiratory distress. She has no wheezes. She has no rales. She exhibits no tenderness.  Abdominal: Soft. Bowel sounds are normal. She exhibits no distension. There is no tenderness.  Musculoskeletal: Normal range of motion. She exhibits no edema and no tenderness.  Lymphadenopathy:    She has no cervical adenopathy.  Neurological: She is alert and oriented to person, place, and time. Coordination normal.  Skin: Skin is warm and dry. No rash noted. No erythema.  Psychiatric: She has a normal mood and affect. Her behavior is normal. Judgment and thought content normal.    Assessment and Plan

## 2013-05-11 NOTE — Patient Instructions (Signed)
You are doing well. No medication changes were made.  Please call us if you have new issues that need to be addressed before your next appt.  Your physician wants you to follow-up in: 6 months.  You will receive a reminder letter in the mail two months in advance. If you don't receive a letter, please call our office to schedule the follow-up appointment.   

## 2013-05-13 ENCOUNTER — Other Ambulatory Visit (INDEPENDENT_AMBULATORY_CARE_PROVIDER_SITE_OTHER): Payer: Medicare Other

## 2013-05-13 DIAGNOSIS — D649 Anemia, unspecified: Secondary | ICD-10-CM

## 2013-05-13 LAB — CBC WITH DIFFERENTIAL/PLATELET
Basophils Absolute: 0 10*3/uL (ref 0.0–0.1)
Basophils Relative: 0.5 % (ref 0.0–3.0)
Eosinophils Absolute: 0.1 10*3/uL (ref 0.0–0.7)
Eosinophils Relative: 1 % (ref 0.0–5.0)
HCT: 37.3 % (ref 36.0–46.0)
HEMOGLOBIN: 12.3 g/dL (ref 12.0–15.0)
LYMPHS PCT: 29.5 % (ref 12.0–46.0)
Lymphs Abs: 1.6 10*3/uL (ref 0.7–4.0)
MCHC: 33.1 g/dL (ref 30.0–36.0)
MCV: 84.9 fl (ref 78.0–100.0)
MONOS PCT: 8.4 % (ref 3.0–12.0)
Monocytes Absolute: 0.4 10*3/uL (ref 0.1–1.0)
NEUTROS ABS: 3.2 10*3/uL (ref 1.4–7.7)
Neutrophils Relative %: 60.6 % (ref 43.0–77.0)
Platelets: 386 10*3/uL (ref 150.0–400.0)
RBC: 4.39 Mil/uL (ref 3.87–5.11)
RDW: 14.7 % — ABNORMAL HIGH (ref 11.5–14.6)
WBC: 5.4 10*3/uL (ref 4.5–10.5)

## 2013-05-13 LAB — IBC PANEL
Iron: 84 ug/dL (ref 42–145)
SATURATION RATIOS: 21.1 % (ref 20.0–50.0)
Transferrin: 284.9 mg/dL (ref 212.0–360.0)

## 2013-05-13 LAB — FERRITIN: FERRITIN: 18.2 ng/mL (ref 10.0–291.0)

## 2013-05-14 ENCOUNTER — Other Ambulatory Visit: Payer: Self-pay | Admitting: *Deleted

## 2013-05-14 ENCOUNTER — Encounter: Payer: Self-pay | Admitting: *Deleted

## 2013-06-02 ENCOUNTER — Ambulatory Visit (INDEPENDENT_AMBULATORY_CARE_PROVIDER_SITE_OTHER): Payer: Medicare Other | Admitting: Internal Medicine

## 2013-06-02 ENCOUNTER — Ambulatory Visit (INDEPENDENT_AMBULATORY_CARE_PROVIDER_SITE_OTHER)
Admission: RE | Admit: 2013-06-02 | Discharge: 2013-06-02 | Disposition: A | Discharge status: Home / Self Care | Payer: Medicare Other | Source: Ambulatory Visit | Attending: Internal Medicine | Admitting: Internal Medicine

## 2013-06-02 ENCOUNTER — Encounter: Payer: Self-pay | Admitting: Internal Medicine

## 2013-06-02 VITALS — BP 130/70 | HR 68 | Temp 98.1°F | Ht 64.0 in | Wt 145.8 lb

## 2013-06-02 DIAGNOSIS — M545 Low back pain, unspecified: Secondary | ICD-10-CM

## 2013-06-02 DIAGNOSIS — I10 Essential (primary) hypertension: Secondary | ICD-10-CM

## 2013-06-02 DIAGNOSIS — I6350 Cerebral infarction due to unspecified occlusion or stenosis of unspecified cerebral artery: Secondary | ICD-10-CM

## 2013-06-02 DIAGNOSIS — M8580 Other specified disorders of bone density and structure, unspecified site: Secondary | ICD-10-CM

## 2013-06-02 DIAGNOSIS — M5137 Other intervertebral disc degeneration, lumbosacral region: Secondary | ICD-10-CM | POA: Diagnosis not present

## 2013-06-02 DIAGNOSIS — F3289 Other specified depressive episodes: Secondary | ICD-10-CM

## 2013-06-02 DIAGNOSIS — M79609 Pain in unspecified limb: Secondary | ICD-10-CM | POA: Diagnosis not present

## 2013-06-02 DIAGNOSIS — K219 Gastro-esophageal reflux disease without esophagitis: Secondary | ICD-10-CM

## 2013-06-02 DIAGNOSIS — M949 Disorder of cartilage, unspecified: Secondary | ICD-10-CM

## 2013-06-02 DIAGNOSIS — M549 Dorsalgia, unspecified: Secondary | ICD-10-CM

## 2013-06-02 DIAGNOSIS — M899 Disorder of bone, unspecified: Secondary | ICD-10-CM

## 2013-06-02 DIAGNOSIS — J3489 Other specified disorders of nose and nasal sinuses: Secondary | ICD-10-CM

## 2013-06-02 DIAGNOSIS — F32A Depression, unspecified: Secondary | ICD-10-CM

## 2013-06-02 DIAGNOSIS — I639 Cerebral infarction, unspecified: Secondary | ICD-10-CM

## 2013-06-02 DIAGNOSIS — R0989 Other specified symptoms and signs involving the circulatory and respiratory systems: Secondary | ICD-10-CM

## 2013-06-02 DIAGNOSIS — F329 Major depressive disorder, single episode, unspecified: Secondary | ICD-10-CM

## 2013-06-02 DIAGNOSIS — E7800 Pure hypercholesterolemia, unspecified: Secondary | ICD-10-CM

## 2013-06-02 LAB — CK: Total CK: 74 U/L (ref 7–177)

## 2013-06-02 LAB — SEDIMENTATION RATE: Sed Rate: 21 mm/hr (ref 0–22)

## 2013-06-02 MED ORDER — ROSUVASTATIN CALCIUM 10 MG PO TABS
ORAL_TABLET | ORAL | Status: DC
Start: 2013-06-02 — End: 2013-11-19

## 2013-06-02 NOTE — Assessment & Plan Note (Addendum)
Was on pravastatin.  Stopped secondary to joint aches and fatigue. Feels some better.   Low cholesterol diet and exercise.  Follow lipid panel and liver function.  Cholesterol last checked.  Elevated.   She has had intolerance to lipitor, crestor and now pravastatin.  We discussed this at length today.  She wants to retry crestor.  Will start low dose q Monday, Wednesday and Friday.  Monitor for side effects.  Check liver panel 6 weeks after starting.

## 2013-06-02 NOTE — Progress Notes (Signed)
Subjective:    Patient ID: Tamara [name], female    DOB: 04/22/1927, 80 y.o.   MRN: [id number]  HPI 80 year old female with past history of hypercholesterolemia, reflux disease (previous H. Pylori), FCD, glaucoma and colitis previously maintained on Pentasa.  She comes in today for a scheduled follow up.  Was admitted 09/21/12 with right arm tingling and expressive aphasia.  MRI revealed an abnormal signal in the left high posterior parietal area c/w acute ischemia.  Carotid ultrasound revealed no hemodynamically significant stenosis (per report).  ECHO revealed normal LV function with mild mitral valve and tricuspid valve regurgitation.  Symptoms improved.  Neurology consulted.  Recommended daily aspirin.   They were also concerned regarding the possibility of paroxysmal afib.  Had requested an event monitor.  Cardiology evaluated and recommended a follow up as an out patient.  Pt discharged on 09/22/12.  Has had no reoccurring symptoms since her discharge.  She saw cardiology.  Wore a 30 day event monitor.  No significant arrythmia.  Feels good.  Stays active.  Breathing stable.  Eating and drinking well.  Bowels doing well.  She is off her cholesterol medication.  She stopped it secondary to increased aches and fatigue.  Still have low back pain and radiation down her legs.  Off cholesterol medication.  Stopping the previous cholesterol medication helped some.  She is wanting to try crestor.  Cholesterol elevated.  Seeing Dr Bridgett Larsson.  Has Buspar now to take prn.  On Lunesta.  Helping her sleep.    Past Medical History  Diagnosis Date  . Hypertension   . Hypercholesterolemia   . GERD (gastroesophageal reflux disease)     EGD - gastritis/mild duodenitis, previous positive H. pylori  . Tubular adenoma of colon   . Fibrocystic breast disease   . Degenerative joint disease   . Osteopenia   . History of depression   . History of skin cancer     facial  . Vitamin D deficiency   . Chicken pox   .  Glaucoma   . Migraines   . Diverticulitis   . Anemia   . Diabetes mellitus   . Helicobacter pylori ab+   . Colitis   . Internal hemorrhoids   . Arthritis   . IBS (irritable bowel syndrome)   . Stroke   . Heart murmur     Outpatient Encounter Prescriptions as of 06/02/2013  Medication Sig  . aspirin 81 MG tablet Take 81 mg by mouth daily.  . busPIRone (BUSPAR) 15 MG tablet Take 7.5 mg by mouth daily as needed.  . Cholecalciferol (VITAMIN D3 PO) Take 300 mg by mouth.  . clopidogrel (PLAVIX) 75 MG tablet Take 1 tablet (75 mg total) by mouth daily.  . eszopiclone (LUNESTA) 2 MG TABS tablet Take 2 mg by mouth at bedtime as needed for sleep. Take immediately before bedtime  . losartan (COZAAR) 50 MG tablet Take 50 mg by mouth daily.   [name] PARoxetine (PAXIL) 30 MG tablet Take 30 mg by mouth daily.  . pravastatin (PRAVACHOL) 80 MG tablet Take 1 tablet (80 mg total) by mouth daily.    Review of Systems Patient denies any headache.  No lightheadedness or dizziness.  No sinus or allergy problems.   No chest pain, tightness. No increased shortness of breath, cough or congestion.  No nausea or vomiting.  No abdominal pain or cramping.  No BRBPR or melana.  No urine change.  Tolerating the low dose aspirin.  On Lunesta.  Helping her sleep.  Had increased joint aching and fatigue.  Feels some better since stopping the cholesterol medication.  Still with some low back pain and pain radiating down her legs.  Varies the dose of her blood pressure medication.         Objective:   Physical Exam  Filed Vitals:   06/02/13 0936  BP: 130/70  Pulse: 68  Temp: 98.1 F (36.7 C)   Blood pressure recheck:  99-61/42  80 year old female in no acute distress.   HEENT:  Nares- clear except for a persistent intranasal lesion.  Raised.  Oropharynx - without lesions. NECK:  Supple.  Nontender.  No audible bruit.  HEART:  Appears to be regular. LUNGS:  No crackles or wheezing audible.  Respirations even and  unlabored.  RADIAL PULSE:  Equal bilaterally.  ABDOMEN:  Soft, nontender.  Bowel sounds present and normal.  No audible abdominal bruit.     EXTREMITIES:  No increased edema present.  DP pulses palpable and equal bilaterally.            Assessment & Plan:  ABDOMINAL PAIN.  Resolved.  Has a history of colitis.  Evaluated by GI.  Had a recent colonoscopy.   States everything checked out fine.  Follow.    HERNIA. Saw Dr Tamala Julian.  S/p repair.  Follow.    INCREASED PSYCHOSOCIAL STRESSORS.  On Paroxetine.  Continue to follow up with Dr Bridgett Larsson.  Doing better now on Buspar prn.  Taking Lunesta to help her sleep.     CARDIOVASCULAR.  Stress test 02/25/07 revealed normal LV function with no evidence of any ischemia.  Known murmur.  Continue risk factor modification.  Recent cva.  Concern regarding the possibility of paroxysmal afib.  Saw Dr Rockey Situ.  Event monitor revealed no significant arrythmia.  Of note, ECHO in the hospital revealed normal heart function and no significant valve abnormality.  No thrombus.     CAROTID BRUIT.  Left carotid bruit.  Previous carotid ultrasound revealed no significant stenosis.  Continue daily aspirin.     HEALTH MAINTENANCE.  Physical 05/06/13.     S/P hysterectomy.  Mammogram 01/30/13 birads I.

## 2013-06-02 NOTE — Progress Notes (Signed)
Pre visit review using our clinic review tool, if applicable. No additional management support is needed unless otherwise documented below in the visit note. 

## 2013-06-06 ENCOUNTER — Encounter: Payer: Self-pay | Admitting: Internal Medicine

## 2013-06-06 ENCOUNTER — Other Ambulatory Visit: Payer: Self-pay | Admitting: Internal Medicine

## 2013-06-06 DIAGNOSIS — J3489 Other specified disorders of nose and nasal sinuses: Secondary | ICD-10-CM | POA: Insufficient documentation

## 2013-06-06 DIAGNOSIS — M549 Dorsalgia, unspecified: Secondary | ICD-10-CM | POA: Insufficient documentation

## 2013-06-06 NOTE — Assessment & Plan Note (Signed)
Recent carotid ultrasound in the hospital revealed no hemodynamically significant stenosis.  Continue daily aspirin and risk factor modification.   

## 2013-06-06 NOTE — Assessment & Plan Note (Signed)
Symptoms controlled.  Follow.   

## 2013-06-06 NOTE — Progress Notes (Signed)
Order placed for ENT nasal lesion.

## 2013-06-06 NOTE — Assessment & Plan Note (Signed)
Persistent nasal lesion.  Refer to ENT for evaluation.

## 2013-06-06 NOTE — Assessment & Plan Note (Signed)
Vitamin D.  Weight bearing exercise.  Follow.    

## 2013-06-06 NOTE — Assessment & Plan Note (Signed)
Admitted with recent CVA.   MRI, carotid ultrasound and ECHO as outlined.  Continue daily aspirin.  Keep follow up with neurology.    

## 2013-06-06 NOTE — Assessment & Plan Note (Signed)
Blood pressure has been under good control.  Instructed her on the importance of taking her medication regularly.  Follow.  Follow metabolic panel.   

## 2013-06-06 NOTE — Assessment & Plan Note (Signed)
Doing better.  On paroxitine.  Has buspar to take prn.  Has Lunesta to help her sleep.    

## 2013-06-06 NOTE — Assessment & Plan Note (Signed)
Persistent back and leg pain as outlined.  Will check L-S spine xray.  Tylenol prn.  Further w/up and treatment pending results.

## 2013-06-11 DIAGNOSIS — J301 Allergic rhinitis due to pollen: Secondary | ICD-10-CM | POA: Diagnosis not present

## 2013-06-11 DIAGNOSIS — L0201 Cutaneous abscess of face: Secondary | ICD-10-CM | POA: Diagnosis not present

## 2013-06-11 DIAGNOSIS — R040 Epistaxis: Secondary | ICD-10-CM | POA: Diagnosis not present

## 2013-06-11 DIAGNOSIS — L03211 Cellulitis of face: Secondary | ICD-10-CM | POA: Diagnosis not present

## 2013-06-14 DIAGNOSIS — R040 Epistaxis: Secondary | ICD-10-CM | POA: Diagnosis not present

## 2013-06-18 DIAGNOSIS — R040 Epistaxis: Secondary | ICD-10-CM | POA: Diagnosis not present

## 2013-06-30 ENCOUNTER — Encounter: Payer: Self-pay | Admitting: *Deleted

## 2013-07-27 DIAGNOSIS — F331 Major depressive disorder, recurrent, moderate: Secondary | ICD-10-CM | POA: Diagnosis not present

## 2013-07-27 DIAGNOSIS — G4700 Insomnia, unspecified: Secondary | ICD-10-CM | POA: Diagnosis not present

## 2013-07-28 ENCOUNTER — Ambulatory Visit (INDEPENDENT_AMBULATORY_CARE_PROVIDER_SITE_OTHER): Payer: Medicare Other | Admitting: Internal Medicine

## 2013-07-28 ENCOUNTER — Encounter: Payer: Self-pay | Admitting: Internal Medicine

## 2013-07-28 VITALS — BP 120/70 | HR 67 | Temp 98.1°F | Ht 64.0 in | Wt 148.5 lb

## 2013-07-28 DIAGNOSIS — J3489 Other specified disorders of nose and nasal sinuses: Secondary | ICD-10-CM

## 2013-07-28 DIAGNOSIS — F329 Major depressive disorder, single episode, unspecified: Secondary | ICD-10-CM

## 2013-07-28 DIAGNOSIS — R0989 Other specified symptoms and signs involving the circulatory and respiratory systems: Secondary | ICD-10-CM | POA: Diagnosis not present

## 2013-07-28 DIAGNOSIS — M899 Disorder of bone, unspecified: Secondary | ICD-10-CM | POA: Diagnosis not present

## 2013-07-28 DIAGNOSIS — F3289 Other specified depressive episodes: Secondary | ICD-10-CM

## 2013-07-28 DIAGNOSIS — F32A Depression, unspecified: Secondary | ICD-10-CM

## 2013-07-28 DIAGNOSIS — I6350 Cerebral infarction due to unspecified occlusion or stenosis of unspecified cerebral artery: Secondary | ICD-10-CM

## 2013-07-28 DIAGNOSIS — M949 Disorder of cartilage, unspecified: Secondary | ICD-10-CM | POA: Diagnosis not present

## 2013-07-28 DIAGNOSIS — I10 Essential (primary) hypertension: Secondary | ICD-10-CM | POA: Diagnosis not present

## 2013-07-28 DIAGNOSIS — E7800 Pure hypercholesterolemia, unspecified: Secondary | ICD-10-CM | POA: Diagnosis not present

## 2013-07-28 DIAGNOSIS — I639 Cerebral infarction, unspecified: Secondary | ICD-10-CM

## 2013-07-28 DIAGNOSIS — K219 Gastro-esophageal reflux disease without esophagitis: Secondary | ICD-10-CM

## 2013-07-28 DIAGNOSIS — M8580 Other specified disorders of bone density and structure, unspecified site: Secondary | ICD-10-CM

## 2013-07-28 LAB — BASIC METABOLIC PANEL
BUN: 16 mg/dL (ref 6–23)
CALCIUM: 9.5 mg/dL (ref 8.4–10.5)
CO2: 27 meq/L (ref 19–32)
Chloride: 104 mEq/L (ref 96–112)
Creatinine, Ser: 0.6 mg/dL (ref 0.4–1.2)
GFR: 101.35 mL/min (ref >60.00)
Glucose, Bld: 97 mg/dL (ref 70–99)
Potassium: 4.9 mEq/L (ref 3.5–5.1)
SODIUM: 137 meq/L (ref 135–145)

## 2013-07-28 LAB — HEPATIC FUNCTION PANEL
ALBUMIN: 3.9 g/dL (ref 3.5–5.2)
ALT: 12 U/L (ref 0–35)
AST: 19 U/L (ref 0–37)
Alkaline Phosphatase: 65 U/L (ref 39–117)
Bilirubin, Direct: 0.1 mg/dL (ref 0.0–0.3)
TOTAL PROTEIN: 6.9 g/dL (ref 6.0–8.3)
Total Bilirubin: 0.4 mg/dL (ref 0.2–1.2)

## 2013-07-29 ENCOUNTER — Encounter: Payer: Self-pay | Admitting: *Deleted

## 2013-07-31 ENCOUNTER — Encounter: Payer: Self-pay | Admitting: Internal Medicine

## 2013-07-31 NOTE — Assessment & Plan Note (Signed)
Blood pressure has been under good control.  Instructed her on the importance of taking her medication regularly.  Follow.  Follow metabolic panel.

## 2013-07-31 NOTE — Assessment & Plan Note (Signed)
Symptoms controlled.  Follow.   

## 2013-07-31 NOTE — Assessment & Plan Note (Signed)
Saw ENT.  Treated.  Resolved.

## 2013-07-31 NOTE — Assessment & Plan Note (Signed)
Recent carotid ultrasound in the hospital revealed no hemodynamically significant stenosis.  Continue daily aspirin and risk factor modification.   

## 2013-07-31 NOTE — Assessment & Plan Note (Signed)
Vitamin D.  Weight bearing exercise.  Follow.

## 2013-07-31 NOTE — Assessment & Plan Note (Signed)
Admitted with recent CVA.   MRI, carotid ultrasound and ECHO as outlined.  Continue daily aspirin.  Keep follow up with neurology.

## 2013-07-31 NOTE — Assessment & Plan Note (Signed)
Was on pravastatin.  Stopped secondary to joint aches and fatigue.  On crestor.  Tolerating.  Follow lipid panel and liver function.

## 2013-07-31 NOTE — Assessment & Plan Note (Signed)
Doing better.  On paroxitine.  Has buspar to take prn.  Has Lunesta to help her sleep.

## 2013-07-31 NOTE — Progress Notes (Signed)
Subjective:    Patient ID: Tamara [name], female    DOB: 07/18/1927, 80 y.o.   MRN: [id number]  HPI 80 year old female with past history of hypercholesterolemia, reflux disease (previous H. Pylori), FCD, glaucoma and colitis previously maintained on Pentasa.  She comes in today for a scheduled follow up.  Was admitted 09/21/12 with right arm tingling and expressive aphasia.  MRI revealed an abnormal signal in the left high posterior parietal area c/w acute ischemia.  Carotid ultrasound revealed no hemodynamically significant stenosis (per report).  ECHO revealed normal LV function with mild mitral valve and tricuspid valve regurgitation.  Symptoms improved.  Neurology consulted.  Recommended daily aspirin.   They were also concerned regarding the possibility of paroxysmal afib.  Had requested an event monitor.  Cardiology evaluated and recommended a follow up as an out patient.  Pt discharged on 09/22/12.  Has had no reoccurring symptoms since her discharge.  She saw cardiology.  Wore a 30 day event monitor.  No significant arrythmia.  Feels good.  Stays active.  Breathing stable.  Eating and drinking well.  Bowels doing well.  Seeing Dr Bridgett Larsson.  Has Buspar now to take prn.  On Lunesta.  Helping her sleep.  Feels better.  Taking crestor.  Tolerating.     Past Medical History  Diagnosis Date  . Hypertension   . Hypercholesterolemia   . GERD (gastroesophageal reflux disease)     EGD - gastritis/mild duodenitis, previous positive H. pylori  . Tubular adenoma of colon   . Fibrocystic breast disease   . Degenerative joint disease   . Osteopenia   . History of depression   . History of skin cancer     facial  . Vitamin D deficiency   . Chicken pox   . Glaucoma   . Migraines   . Diverticulitis   . Anemia   . Diabetes mellitus   . Helicobacter pylori ab+   . Colitis   . Internal hemorrhoids   . Arthritis   . IBS (irritable bowel syndrome)   . Stroke   . Heart murmur     Outpatient  Encounter Prescriptions as of 07/28/2013  Medication Sig  . aspirin 81 MG tablet Take 81 mg by mouth daily.  . busPIRone (BUSPAR) 15 MG tablet Take 7.5 mg by mouth daily as needed.  . clopidogrel (PLAVIX) 75 MG tablet Take 37.5 mg by mouth daily.  . eszopiclone (LUNESTA) 2 MG TABS tablet Take 2 mg by mouth at bedtime as needed for sleep. Take immediately before bedtime  . losartan (COZAAR) 50 MG tablet Take 25 mg by mouth daily.   [name] PARoxetine (PAXIL) 30 MG tablet Take 30 mg by mouth daily.  . rosuvastatin (CRESTOR) 10 MG tablet Take one tablet q Mon, Wed and Friday  . [DISCONTINUED] Cholecalciferol (VITAMIN D3 PO) Take 300 mg by mouth.  . [DISCONTINUED] clopidogrel (PLAVIX) 75 MG tablet Take 1 tablet (75 mg total) by mouth daily.  . [DISCONTINUED] pravastatin (PRAVACHOL) 80 MG tablet Take 1 tablet (80 mg total) by mouth daily.    Review of Systems Patient denies any headache.  No lightheadedness or dizziness.  No sinus or allergy problems.   No chest pain, tightness. No increased shortness of breath, cough or congestion.  No nausea or vomiting.  No abdominal pain or cramping.  No BRBPR or melana.  No urine change.  Tolerating the low dose aspirin.  On Lunesta.  Helping her sleep. Tolerating cresotr. Overall feels better.  Objective:   Physical Exam  Filed Vitals:   07/28/13 1131  BP: 120/70  Pulse: 67  Temp: 98.1 F (36.7 C)   Blood pressure recheck:  75/37  80 year old female in no acute distress.   HEENT:  Nares- clear.  Oropharynx - without lesions. NECK:  Supple.  Nontender.  No audible bruit.  HEART:  Appears to be regular. LUNGS:  No crackles or wheezing audible.  Respirations even and unlabored.  RADIAL PULSE:  Equal bilaterally.  ABDOMEN:  Soft, nontender.  Bowel sounds present and normal.  No audible abdominal bruit.     EXTREMITIES:  No increased edema present.  DP pulses palpable and equal bilaterally.            Assessment & Plan:  ABDOMINAL PAIN.  Resolved.   Has a history of colitis.  Evaluated by GI.  Had a recent colonoscopy.   States everything checked out fine.  Follow.    HERNIA. Saw Dr Tamala Julian.  S/p repair.  Follow.    INCREASED PSYCHOSOCIAL STRESSORS.  On Paroxetine.  Continue to follow up with Dr Bridgett Larsson.  Doing better now on Buspar prn.  Taking Lunesta to help her sleep.     CARDIOVASCULAR.  Stress test 02/25/07 revealed normal LV function with no evidence of any ischemia.  Known murmur.  Continue risk factor modification.  Recent cva.  Concern regarding the possibility of paroxysmal afib.  Saw Dr Rockey Situ.  Event monitor revealed no significant arrythmia.  Of note, ECHO in the hospital revealed normal heart function and no significant valve abnormality.  No thrombus.     CAROTID BRUIT.  Left carotid bruit.  Previous carotid ultrasound revealed no significant stenosis.  Continue daily aspirin.     HEALTH MAINTENANCE.  Physical 05/06/13.     S/P hysterectomy.  Mammogram 01/30/13 birads I.

## 2013-10-28 ENCOUNTER — Ambulatory Visit (INDEPENDENT_AMBULATORY_CARE_PROVIDER_SITE_OTHER): Payer: Medicare Other | Admitting: Internal Medicine

## 2013-10-28 ENCOUNTER — Encounter: Payer: Self-pay | Admitting: Internal Medicine

## 2013-10-28 ENCOUNTER — Telehealth: Payer: Self-pay | Admitting: Internal Medicine

## 2013-10-28 VITALS — BP 138/70 | HR 72 | Temp 98.3°F | Ht 64.0 in | Wt 150.5 lb

## 2013-10-28 DIAGNOSIS — E7800 Pure hypercholesterolemia, unspecified: Secondary | ICD-10-CM

## 2013-10-28 DIAGNOSIS — R739 Hyperglycemia, unspecified: Secondary | ICD-10-CM

## 2013-10-28 DIAGNOSIS — R1310 Dysphagia, unspecified: Secondary | ICD-10-CM | POA: Diagnosis not present

## 2013-10-28 DIAGNOSIS — I639 Cerebral infarction, unspecified: Secondary | ICD-10-CM

## 2013-10-28 DIAGNOSIS — K219 Gastro-esophageal reflux disease without esophagitis: Secondary | ICD-10-CM

## 2013-10-28 DIAGNOSIS — I10 Essential (primary) hypertension: Secondary | ICD-10-CM

## 2013-10-28 NOTE — Assessment & Plan Note (Signed)
Admitted with CVA 8/14.   MRI, carotid ultrasound and ECHO as outlined.  On aspirin and plavix.  Had the episode approximately one month ago as outlined.  Some trouble with words.  EMS called.  She declined ER evaluation.  Has had no reoccurrence.  Will continue aspirin and plavix as she is doing.  Risk factor modification.  Schedule f/u with neurology.  Any reoccurrence of symptoms or problems, she is to be evaluated immediately.

## 2013-10-28 NOTE — Assessment & Plan Note (Signed)
Symptoms as outlined.  Start zantac.

## 2013-10-28 NOTE — Assessment & Plan Note (Signed)
Blood pressure has been under good control.  Instructed her on the importance of taking her medication regularly.  Follow.  Follow metabolic panel.

## 2013-10-28 NOTE — Progress Notes (Signed)
Subjective:    Patient ID: Tamara [name], female    DOB: [birth year], 80 y.o.   MRN: [id number]  HPI 80 year old female with past history of hypercholesterolemia, reflux disease (previous H. Pylori), FCD, glaucoma and colitis previously maintained on Pentasa.  She comes in today for a scheduled follow up.  Was admitted 09/21/12 with right arm tingling and expressive aphasia.  MRI revealed an abnormal signal in the left high posterior parietal area c/w acute ischemia.  Carotid ultrasound revealed no hemodynamically significant stenosis (per report).  ECHO revealed normal LV function with mild mitral valve and tricuspid valve regurgitation.  Symptoms improved.  Neurology consulted.  They were also concerned regarding the possibility of paroxysmal afib.  Had requested an event monitor.  Cardiology evaluated and recommended a follow up as an out patient.  Pt discharged on 09/22/12.   She saw cardiology.  Wore a 30 day event monitor.  No significant arrythmia.  Stays active.  Feels good.   Breathing stable.  Bowels doing stable.  She did have and episode several weeks ago, where she felt funny.  Was going to bed.  States she felt like her words would not come out.  Daughter called and came immediately over.  Called EMS.  She states the episode may have lasted 30 minutes.  EMD evaluated.  She declined ER evaluation.  Felt back to her baseline.  Has had no reoccurrence.  Seeing Dr Bridgett Larsson.  Has Buspar now to take prn.  On Lunesta.  Helping her sleep.  Feels better.  Taking crestor.  Tolerating.  States she is taking her medication as directed.  May have missed a day of her aspirin and plavix. She also reports difficulty swallowing. States this was going on before the event outlined above.  Feels like food is getting stuck when she swallows.  Some acid reflux.  No vomiting.  Eating.     Past Medical History  Diagnosis Date  . Hypertension   . Hypercholesterolemia   . GERD (gastroesophageal reflux disease)     EGD -  gastritis/mild duodenitis, previous positive H. pylori  . Tubular adenoma of colon   . Fibrocystic breast disease   . Degenerative joint disease   . Osteopenia   . History of depression   . History of skin cancer     facial  . Vitamin D deficiency   . Chicken pox   . Glaucoma   . Migraines   . Diverticulitis   . Anemia   . Diabetes mellitus   . Helicobacter pylori ab+   . Colitis   . Internal hemorrhoids   . Arthritis   . IBS (irritable bowel syndrome)   . Stroke   . Heart murmur     Outpatient Encounter Prescriptions as of 10/28/2013  Medication Sig  . aspirin 81 MG tablet Take 81 mg by mouth daily.  . busPIRone (BUSPAR) 15 MG tablet Take 7.5 mg by mouth daily as needed.  . clopidogrel (PLAVIX) 75 MG tablet Take 37.5 mg by mouth daily.  . eszopiclone (LUNESTA) 2 MG TABS tablet Take 2 mg by mouth at bedtime as needed for sleep. Take immediately before bedtime  . losartan (COZAAR) 50 MG tablet Take 25 mg by mouth daily.   [name] PARoxetine (PAXIL) 30 MG tablet Take 30 mg by mouth daily.  . rosuvastatin (CRESTOR) 10 MG tablet Take one tablet q Mon, Wed and Friday    Review of Systems Patient denies any headache.  No lightheadedness or dizziness.  No sinus or allergy problems.   No chest pain, tightness.  No increased shortness of breath, cough or congestion.  No nausea or vomiting.  Acid reflux as outlined.  Dysphagia as outlined.   No abdominal pain or cramping.  No BRBPR or melana.  No urine change.  Tolerating the low dose aspirin and taking plavix.  Tolerating crestor.  Overall feels better.  Had the episode with her speech as outlined.       Objective:   Physical Exam  Filed Vitals:   10/28/13 0939  BP: 138/70  Pulse: 72  Temp: 98.3 F (36.8 C)   Blood pressure recheck:  9/2  80 year old female in no acute distress.   HEENT:  Nares- clear.  Oropharynx - without lesions. NECK:  Supple.  Nontender.  HEART:  Appears to be regular. LUNGS:  No crackles or wheezing  audible.  Respirations even and unlabored.  RADIAL PULSE:  Equal bilaterally.  ABDOMEN:  Soft, nontender.  Bowel sounds present and normal.  No audible abdominal bruit.     EXTREMITIES:  No increased edema present.  DP pulses palpable and equal bilaterally.            Assessment & Plan:  ABDOMINAL PAIN.  Resolved.  Has a history of colitis.  Evaluated by GI.  Had a recent colonoscopy.   States everything checked out fine.  Follow.    HERNIA. Saw Dr Tamala Julian.  S/p repair.  Follow.    INCREASED PSYCHOSOCIAL STRESSORS.  On Paroxetine.  Continue to follow up with Dr Bridgett Larsson.  Doing better now on Buspar prn.     CARDIOVASCULAR.  Stress test 02/25/07 revealed normal LV function with no evidence of any ischemia.  Known murmur.  Continue risk factor modification.  Recent cva.  Concern regarding the possibility of paroxysmal afib.  Saw Dr Rockey Situ.  Event monitor revealed no significant arrythmia.  Of note, ECHO in the hospital revealed normal heart function and no significant valve abnormality.  No thrombus.     DOCUMENTED CAROTID BRUIT.  Left carotid bruit.  Previous carotid ultrasound revealed no significant stenosis.  Continue daily aspirin and plavix.    HEALTH MAINTENANCE.  Physical 05/06/13.     S/P hysterectomy.  Mammogram 01/30/13 birads I.     I spent 40 minutes with the patient and more than 50% of the time was spent in consultation regarding the above.

## 2013-10-28 NOTE — Assessment & Plan Note (Signed)
Was on pravastatin.  Stopped secondary to joint aches and fatigue.  On crestor.  Tolerating.  Follow lipid panel and liver function.  Due check.  Has eaten today.  Schedule fasting lab.

## 2013-10-28 NOTE — Progress Notes (Signed)
Pre visit review using our clinic review tool, if applicable. No additional management support is needed unless otherwise documented below in the visit note. 

## 2013-10-28 NOTE — Patient Instructions (Signed)
Take zantac (ranitidine) 150mg  - one tablet 30 minutes before breakfast.

## 2013-10-28 NOTE — Assessment & Plan Note (Signed)
Trouble swallowing food as outlined.  Question of acid reflux.  Start zantac as directed.  Schedule for UGI with barium swallow.  Further w/up pending results.  Instructed to chew food well, take small bites and eat slowly.

## 2013-10-28 NOTE — Telephone Encounter (Signed)
Pt is having increased dysphagia and needs a modified barium swallow with UGI as soon as possible.  I did not see how to order this in the system.  I put it in as a modified barium swallow and wrote in upper GI which is what I have done in the past.  Please schedule at [facility].  Also she has seen DR Manuella Ghazi - neurology [facility] for CVA.  With recent acute problem with speech and getting her words out.  Needs a f/u asap.  I was told to let you know if a referral was more urgent.  Thanks.

## 2013-10-29 DIAGNOSIS — I63412 Cerebral infarction due to embolism of left middle cerebral artery: Secondary | ICD-10-CM | POA: Diagnosis not present

## 2013-10-29 DIAGNOSIS — R1314 Dysphagia, pharyngoesophageal phase: Secondary | ICD-10-CM | POA: Diagnosis not present

## 2013-10-29 NOTE — Telephone Encounter (Signed)
Thank you for getting the appt so fast.  Thank you for helping [place] out..  We appreciate it.

## 2013-10-29 NOTE — Telephone Encounter (Signed)
[name],Tamara is scheduled today with Dr Manuella Ghazi 10-2

## 2013-11-04 ENCOUNTER — Other Ambulatory Visit: Payer: 59

## 2013-11-06 DIAGNOSIS — J029 Acute pharyngitis, unspecified: Secondary | ICD-10-CM | POA: Diagnosis not present

## 2013-11-06 DIAGNOSIS — J069 Acute upper respiratory infection, unspecified: Secondary | ICD-10-CM | POA: Diagnosis not present

## 2013-11-06 DIAGNOSIS — H65192 Other acute nonsuppurative otitis media, left ear: Secondary | ICD-10-CM | POA: Diagnosis not present

## 2013-11-08 ENCOUNTER — Ambulatory Visit: Payer: 59 | Admitting: Cardiovascular Disease

## 2013-11-15 ENCOUNTER — Ambulatory Visit (INDEPENDENT_AMBULATORY_CARE_PROVIDER_SITE_OTHER): Payer: Medicare Other | Admitting: Cardiovascular Disease

## 2013-11-15 ENCOUNTER — Telehealth: Payer: Self-pay | Admitting: Internal Medicine

## 2013-11-15 ENCOUNTER — Encounter: Payer: Self-pay | Admitting: Cardiovascular Disease

## 2013-11-15 VITALS — BP 140/72 | HR 76 | Ht 63.0 in | Wt 149.5 lb

## 2013-11-15 DIAGNOSIS — I159 Secondary hypertension, unspecified: Secondary | ICD-10-CM | POA: Diagnosis not present

## 2013-11-15 DIAGNOSIS — F329 Major depressive disorder, single episode, unspecified: Secondary | ICD-10-CM

## 2013-11-15 DIAGNOSIS — R5383 Other fatigue: Secondary | ICD-10-CM | POA: Diagnosis not present

## 2013-11-15 DIAGNOSIS — I6523 Occlusion and stenosis of bilateral carotid arteries: Secondary | ICD-10-CM

## 2013-11-15 DIAGNOSIS — F32A Depression, unspecified: Secondary | ICD-10-CM

## 2013-11-15 DIAGNOSIS — E7800 Pure hypercholesterolemia, unspecified: Secondary | ICD-10-CM

## 2013-11-15 DIAGNOSIS — I6529 Occlusion and stenosis of unspecified carotid artery: Secondary | ICD-10-CM | POA: Insufficient documentation

## 2013-11-15 DIAGNOSIS — I639 Cerebral infarction, unspecified: Secondary | ICD-10-CM | POA: Diagnosis not present

## 2013-11-15 NOTE — Assessment & Plan Note (Addendum)
Tamara [name] looks more depressed today than on prior visits. Long discussion with her and her daughter. Suspect she may need a change in her SSRIs. Suggested she followup with Dr. Nicki Reaper. This has been a long-standing issue, worse recently per the daughter. Patient now spending long hours in the bed, not exerting herself much, chronic fatigue and weakness

## 2013-11-15 NOTE — Progress Notes (Signed)
Patient ID: Tamara [name], female    DOB: [birth year], 80 y.o.   MRN: [id number]  HPI Comments: Tamara [name] is a very pleasant 80 year old woman with hyperlipidemia, hypertension,  Previous Stroke . Mild carotid plaque bilaterally seen Depression   admitted to the hospital August 25 and discharged 09/22/2012. Diagnosis of embolic infarct in the left parietal area with right hand paresthesias and expressive aphasia.  no documented atrial fibrillation though significant concern given history of fluttering and palpitations.  30 day Holter did not show any significant arrhythmia  residual deficits have returned to normal.  On aspirin and Plavix with no further symptoms  In followup today, she continues to have chronic fatigue, insomnia at times, general malaise. This has been a chronic issue. She has been taking Paxil for many years, followed by Dr. Bridgett Larsson. Daughter is very concerned about recent changes, worsening lethargy, staying in bed all day.  Patient reports profound fatigue and unable to walk like she was in the past. No gallop and go even at rest In the past she has stopped many of her medications for these similar symptoms with no improvement.  Echocardiogram 09/22/2012 shows ejection fraction 55-60% mild mild MR and TR, normal right ventricular systolic pressures bubble study was not performed to look for PFO EKG in the hospital showed normal sinus rhythm with rate 61 beats per minute, essentially normal MRI of the brain showed abnormal signal in the left high posterior parietal region consistent with acute ischemia Carotid ultrasound showed no hemodynamic significance. There is calcified mural plaque identified on the right and left, less than 50%  EKG shows normal sinus rhythm with rate 76 beats a minute, no significant ST or T wave changes   Outpatient Encounter Prescriptions as of 11/15/2013  Medication Sig  . aspirin 81 MG tablet Take 81 mg by mouth daily.  . busPIRone (BUSPAR)  15 MG tablet Take 7.5 mg by mouth daily as needed.  . clopidogrel (PLAVIX) 75 MG tablet Take 75 mg by mouth daily.   . eszopiclone (LUNESTA) 2 MG TABS tablet Take 2 mg by mouth at bedtime as needed for sleep. Take immediately before bedtime  . losartan (COZAAR) 50 MG tablet Take 25 mg by mouth daily.   [name] PARoxetine (PAXIL) 30 MG tablet Take 30 mg by mouth daily.  . rosuvastatin (CRESTOR) 10 MG tablet Take one tablet q Mon, Wed and Friday    Review of Systems  Constitutional: Positive for fatigue.  HENT: Negative.   Eyes: Negative.   Respiratory: Negative.   Cardiovascular: Negative.   Gastrointestinal: Negative.   Endocrine: Negative.   Musculoskeletal: Negative.   Skin: Negative.   Allergic/Immunologic: Negative.   Neurological: Positive for weakness.  Hematological: Negative.   Psychiatric/Behavioral: Positive for dysphoric mood.  All other systems reviewed and are negative.   BP 140/72  Pulse 76  Ht 5\' 3"  (1.6 m)  Wt 149 lb 8 oz (67.813 kg)  BMI 26.49 kg/m2  LMP 01/17/1972  Physical Exam  Nursing note and vitals reviewed. Constitutional: She is oriented to person, place, and time. She appears well-developed and well-nourished.  HENT:  Head: Normocephalic.  Nose: Nose normal.  Mouth/Throat: Oropharynx is clear and moist.  Eyes: Conjunctivae are normal. Pupils are equal, round, and reactive to light.  Neck: Normal range of motion. Neck supple. No JVD present.  Cardiovascular: Normal rate, regular rhythm, S1 normal, S2 normal, normal heart sounds and intact distal pulses.  Exam reveals no gallop and no friction rub.  No murmur heard. Pulmonary/Chest: Effort normal and breath sounds normal. No respiratory distress. She has no wheezes. She has no rales. She exhibits no tenderness.  Abdominal: Soft. Bowel sounds are normal. She exhibits no distension. There is no tenderness.  Musculoskeletal: Normal range of motion. She exhibits no edema and no tenderness.   Lymphadenopathy:    She has no cervical adenopathy.  Neurological: She is alert and oriented to person, place, and time. Coordination normal.  Skin: Skin is warm and dry. No rash noted. No erythema.  Psychiatric: She has a normal mood and affect. Her behavior is normal. Judgment and thought content normal.    Assessment and Plan

## 2013-11-15 NOTE — Assessment & Plan Note (Signed)
Suspect symptoms of fatigue, malaise are attributable to her depression

## 2013-11-15 NOTE — Patient Instructions (Addendum)
Your next appointment will be scheduled in our new office located at :  [facility]  [address] [place], [place] [address]   You are doing well. No medication changes were made.  Please call us if you have new issues that need to be addressed before your next appt.  Your physician wants you to follow-up in: 6 months.  You will receive a reminder letter in the mail two months in advance. If you don't receive a letter, please call our office to schedule the follow-up appointment.

## 2013-11-15 NOTE — Assessment & Plan Note (Signed)
Mild bilateral carotid stenosis seen on ultrasound in 2014 done at [facility] continued aggressive cholesterol management

## 2013-11-15 NOTE — Progress Notes (Signed)
Telephone message sent about f/u for depression.

## 2013-11-15 NOTE — Assessment & Plan Note (Signed)
Blood pressure is well controlled on today's visit. No changes made to the medications. 

## 2013-11-15 NOTE — Telephone Encounter (Signed)
I received a note from Dr Rockey Situ about Tamara [name].  Concern over worsening depression.  He had sent info to me.  She sees a psychiatrist (Dr Bridgett Larsson) - or she was.  I do not mind seeing her, but I would recommend follow up with him since he is the one that has been prescribing her paxil and buspar.  Let me know if any problems.    Dr Nicki Reaper

## 2013-11-15 NOTE — Assessment & Plan Note (Signed)
Encouraged her to stay on her Crestor. Cholesterol typically runs high Repeat lab work in several months with Dr. Nicki Reaper

## 2013-11-15 NOTE — Progress Notes (Signed)
Phone message sent to f/u regarding depression.

## 2013-11-16 NOTE — Telephone Encounter (Signed)
LMTCB on home number

## 2013-11-16 NOTE — Telephone Encounter (Signed)
Pt.notified

## 2013-11-19 ENCOUNTER — Ambulatory Visit: Payer: 59 | Admitting: Cardiovascular Disease

## 2013-11-19 ENCOUNTER — Telehealth: Payer: Self-pay | Admitting: *Deleted

## 2013-11-19 ENCOUNTER — Other Ambulatory Visit: Payer: Self-pay | Admitting: Internal Medicine

## 2013-11-19 ENCOUNTER — Ambulatory Visit: Payer: Self-pay | Admitting: Internal Medicine

## 2013-11-19 ENCOUNTER — Other Ambulatory Visit: Payer: Self-pay | Admitting: *Deleted

## 2013-11-19 DIAGNOSIS — Z8673 Personal history of transient ischemic attack (TIA), and cerebral infarction without residual deficits: Secondary | ICD-10-CM | POA: Diagnosis not present

## 2013-11-19 DIAGNOSIS — R1310 Dysphagia, unspecified: Secondary | ICD-10-CM | POA: Diagnosis not present

## 2013-11-19 DIAGNOSIS — R4702 Dysphasia: Secondary | ICD-10-CM | POA: Diagnosis not present

## 2013-11-19 NOTE — Telephone Encounter (Signed)
[facility] called, need barium swallow orders entered as Barium swallow with upper GI. Cannot accept order they received for modified barium swallow. Has appt scheduled next week. I tried to enter, but it wasn't clear, so did not want to enter in error. Once signed, fax to [phone]

## 2013-11-19 NOTE — Telephone Encounter (Signed)
Order faxed.

## 2013-11-19 NOTE — Telephone Encounter (Signed)
I tried to enter this in previously.  It does not give me a choice for barium swallow with UGI.  Can I just write the order and fax to them.  I rarely order this, but I think that is what I ended up doing when amber was here.

## 2013-11-22 DIAGNOSIS — F339 Major depressive disorder, recurrent, unspecified: Secondary | ICD-10-CM | POA: Diagnosis not present

## 2013-11-30 ENCOUNTER — Encounter: Payer: Self-pay | Admitting: Internal Medicine

## 2013-11-30 ENCOUNTER — Ambulatory Visit (INDEPENDENT_AMBULATORY_CARE_PROVIDER_SITE_OTHER): Payer: Medicare Other | Admitting: Internal Medicine

## 2013-11-30 VITALS — BP 110/60 | HR 71 | Temp 98.3°F | Ht 63.0 in | Wt 149.5 lb

## 2013-11-30 DIAGNOSIS — F32A Depression, unspecified: Secondary | ICD-10-CM

## 2013-11-30 DIAGNOSIS — F329 Major depressive disorder, single episode, unspecified: Secondary | ICD-10-CM | POA: Diagnosis not present

## 2013-11-30 DIAGNOSIS — R1310 Dysphagia, unspecified: Secondary | ICD-10-CM | POA: Diagnosis not present

## 2013-11-30 DIAGNOSIS — I639 Cerebral infarction, unspecified: Secondary | ICD-10-CM | POA: Diagnosis not present

## 2013-11-30 DIAGNOSIS — I6523 Occlusion and stenosis of bilateral carotid arteries: Secondary | ICD-10-CM | POA: Diagnosis not present

## 2013-11-30 DIAGNOSIS — E7800 Pure hypercholesterolemia, unspecified: Secondary | ICD-10-CM

## 2013-11-30 DIAGNOSIS — K219 Gastro-esophageal reflux disease without esophagitis: Secondary | ICD-10-CM

## 2013-11-30 DIAGNOSIS — I159 Secondary hypertension, unspecified: Secondary | ICD-10-CM

## 2013-12-05 ENCOUNTER — Encounter: Payer: Self-pay | Admitting: Internal Medicine

## 2013-12-05 NOTE — Progress Notes (Signed)
Subjective:    Patient ID: Tamara [name], female    DOB: [birth year], 80 y.o.   MRN: [id number]  HPI 80 year old female with past history of hypercholesterolemia, reflux disease (previous H. Pylori), FCD, glaucoma and colitis previously maintained on Pentasa.  She comes in today for a scheduled follow up.  Was admitted 09/21/12 with right arm tingling and expressive aphasia.  MRI revealed an abnormal signal in the left high posterior parietal area c/w acute ischemia.  Carotid ultrasound revealed no hemodynamically significant stenosis (per report).  ECHO revealed normal LV function with mild mitral valve and tricuspid valve regurgitation.  Symptoms improved.  Neurology consulted.  They were also concerned regarding the possibility of paroxysmal afib.  Had requested an event monitor.  She saw cardiology.  Wore a 30 day event monitor.  No significant arrythmia.  Stays active.  Feels good.   Breathing stable.  Bowels stable.  Seeing Dr Bridgett Larsson.  Has Buspar now to take prn.  On Lunesta.  Helping her sleep.  Feels better.  Feels she is doing better.  Tapering her paxil.  Taking crestor.  Tolerating.  States she is taking her medication as directed.  She also reports still noticing some difficulty swallowing.  Recent UGI with barium swallow revealed mild deformity involving the posterior cervical esophagus secondary to cervical vertebral endplate osteophytes.  Exam otherwise normal.   No vomiting.  Eating.  Discussed referral to GI.  She agrees.     Past Medical History  Diagnosis Date  . Hypertension   . Hypercholesterolemia   . GERD (gastroesophageal reflux disease)     EGD - gastritis/mild duodenitis, previous positive H. pylori  . Tubular adenoma of colon   . Fibrocystic breast disease   . Degenerative joint disease   . Osteopenia   . History of depression   . History of skin cancer     facial  . Vitamin D deficiency   . Chicken pox   . Glaucoma   . Migraines   . Diverticulitis   . Anemia   .  Diabetes mellitus   . Helicobacter pylori ab+   . Colitis   . Internal hemorrhoids   . Arthritis   . IBS (irritable bowel syndrome)   . Stroke   . Heart murmur     Outpatient Encounter Prescriptions as of 11/30/2013  Medication Sig  . aspirin 81 MG tablet Take 81 mg by mouth daily.  . busPIRone (BUSPAR) 15 MG tablet Take 7.5 mg by mouth daily as needed.  . clopidogrel (PLAVIX) 75 MG tablet Take 75 mg by mouth daily.   . CRESTOR 10 MG tablet TAKE 1 TABLET EVERY MONDAY,WEDNESDAY AND FRIDAY  . eszopiclone (LUNESTA) 2 MG TABS tablet Take 2 mg by mouth at bedtime as needed for sleep. Take immediately before bedtime  . losartan (COZAAR) 50 MG tablet Take 25 mg by mouth daily.   [name] PARoxetine (PAXIL) 30 MG tablet Take 25 mg by mouth daily.     Review of Systems Patient denies any headache.  No lightheadedness or dizziness.  No sinus or allergy problems.   No chest pain, tightness.  No increased shortness of breath, cough or congestion.  No nausea or vomiting.   Dysphagia as outlined.   No abdominal pain or cramping.  No BRBPR or melana.  No urine change.  Tolerating the low dose aspirin and taking plavix.  Tolerating crestor.  Overall feels better.  Stress is better.  Tapering the paxil.  Objective:   Physical Exam  Filed Vitals:   11/30/13 1131  BP: 110/60  Pulse: 71  Temp: 98.3 F (36.8 C)   Blood pressure recheck:  31/64  81 year old female in no acute distress.   HEENT:  Nares- clear.  Oropharynx - without lesions. NECK:  Supple.  Nontender.  HEART:  Appears to be regular. LUNGS:  No crackles or wheezing audible.  Respirations even and unlabored.  RADIAL PULSE:  Equal bilaterally.  ABDOMEN:  Soft, nontender.  Bowel sounds present and normal.  No audible abdominal bruit.     EXTREMITIES:  No increased edema present.  DP pulses palpable and equal bilaterally.            Assessment & Plan:  ABDOMINAL PAIN.  Resolved.  Has a history of colitis.  Evaluated by GI.  Had a  recent colonoscopy.   States everything checked out fine.  Follow.    HERNIA. Saw Dr Tamala Julian.  S/p repair.  Follow.    CARDIOVASCULAR.  Stress test 02/25/07 revealed normal LV function with no evidence of any ischemia.  Known murmur.  Continue risk factor modification.  Recent cva.  Concern regarding the possibility of paroxysmal afib.  Saw Dr Rockey Situ.  Event monitor revealed no significant arrythmia.  Of note, ECHO in the hospital revealed normal heart function and no significant valve abnormality.  No thrombus.     DOCUMENTED CAROTID BRUIT.  Left carotid bruit.  Previous carotid ultrasound revealed no significant stenosis.  Continue daily aspirin and plavix.  Dysphagia Had UGI with barium swallow as outlined.  Still persistent symptoms.  Refer to GI for evaluation.  Zantac.   - Ambulatory referral to Gastroenterology  Secondary hypertension, unspecified Blood pressure under reasonable control.  Follow.    CVA (cerebral vascular accident) On aspirin and plavix.  Doing well.  Just saw Dr Manuella Ghazi.    Carotid stenosis, bilateral Mild bilateral carotid stenosis seen on ultrasound in 2014.  Recommend continued risk factor modification, including aggressive cholesterol management.    Gastroesophageal reflux disease, esophagitis presence not specified See above.   Hypercholesterolemia Low cholesterol diet and exercise.  Check lipid panel.  Has had issues with statins in the past.  Continue crestor.     Depression Doing better.  Saw Dr Bridgett Larsson.  Tapering her paxil.  Follow.     HEALTH MAINTENANCE.  Physical 05/06/13.     S/P hysterectomy.  Mammogram 01/30/13 birads I.     I spent 25 minutes with the patient and more than 50% of the time was spent in consultation regarding the above.

## 2013-12-07 ENCOUNTER — Telehealth: Payer: Self-pay | Admitting: *Deleted

## 2013-12-07 NOTE — Telephone Encounter (Signed)
Noted  

## 2013-12-07 NOTE — Telephone Encounter (Signed)
FMLA was received for completion & placed in Dr. Nicki Reaper red folder

## 2013-12-14 ENCOUNTER — Other Ambulatory Visit (INDEPENDENT_AMBULATORY_CARE_PROVIDER_SITE_OTHER): Payer: Medicare Other

## 2013-12-14 DIAGNOSIS — E7800 Pure hypercholesterolemia, unspecified: Secondary | ICD-10-CM

## 2013-12-14 DIAGNOSIS — I10 Essential (primary) hypertension: Secondary | ICD-10-CM

## 2013-12-14 DIAGNOSIS — R739 Hyperglycemia, unspecified: Secondary | ICD-10-CM

## 2013-12-14 LAB — HEMOGLOBIN A1C: Hgb A1c MFr Bld: 6 % (ref 4.6–6.5)

## 2013-12-15 ENCOUNTER — Other Ambulatory Visit: Payer: Self-pay

## 2013-12-15 LAB — HEPATIC FUNCTION PANEL
ALBUMIN: 3.9 g/dL (ref 3.5–5.2)
ALK PHOS: 64 U/L (ref 39–117)
ALT: 13 U/L (ref 0–35)
AST: 18 U/L (ref 0–37)
Bilirubin, Direct: 0.1 mg/dL (ref 0.0–0.3)
Total Bilirubin: 0.6 mg/dL (ref 0.2–1.2)
Total Protein: 6.9 g/dL (ref 6.0–8.3)

## 2013-12-15 LAB — LIPID PANEL
CHOL/HDL RATIO: 3
Cholesterol: 249 mg/dL — ABNORMAL HIGH (ref 0–200)
HDL: 75.2 mg/dL (ref >39.00)
LDL CALC: 161 mg/dL — AB (ref 0–99)
NONHDL: 173.8
Triglycerides: 64 mg/dL (ref 0.0–149.0)
VLDL: 12.8 mg/dL (ref 0.0–40.0)

## 2013-12-15 LAB — BASIC METABOLIC PANEL
BUN: 13 mg/dL (ref 6–23)
CHLORIDE: 107 meq/L (ref 96–112)
CO2: 26 mEq/L (ref 19–32)
Calcium: 9.2 mg/dL (ref 8.4–10.5)
Creatinine, Ser: 0.8 mg/dL (ref 0.4–1.2)
GFR: 77.08 mL/min (ref >60.00)
Glucose, Bld: 89 mg/dL (ref 70–99)
Potassium: 4.5 mEq/L (ref 3.5–5.1)
SODIUM: 141 meq/L (ref 135–145)

## 2013-12-15 MED ORDER — ROSUVASTATIN CALCIUM 10 MG PO TABS: ORAL_TABLET | ORAL | Status: DC

## 2013-12-15 NOTE — Telephone Encounter (Signed)
After given patient lab results, she requested a refill on crestor.

## 2013-12-17 ENCOUNTER — Telehealth: Payer: Self-pay | Admitting: Internal Medicine

## 2013-12-17 DIAGNOSIS — Z7689 Persons encountering health services in other specified circumstances: Secondary | ICD-10-CM

## 2013-12-17 NOTE — Telephone Encounter (Signed)
FYI

## 2013-12-17 NOTE — Telephone Encounter (Signed)
Tamara [name], the pt's daughter called in regards to having FMLA paperwork. She said she's called numerous times regarding this and is wondering if it's been filled out yet. I saw Dr. Tamala Julian and asked her about it. She said it would be ok to tell Candace the paperwork will be ready for her by Monday morning. Candace said that would be fine and she'll be by to pick it up before going around so maybe sometime around 2pm. Thank you.

## 2013-12-17 NOTE — Telephone Encounter (Signed)
Received paperwork less than a week ago.  Will have complete on Monday 12/20/13.

## 2013-12-20 NOTE — Telephone Encounter (Signed)
Left voicemail notifying Tamara [name] that FMLA forms have been completed & placed up front for pick up.

## 2013-12-22 ENCOUNTER — Encounter: Payer: Self-pay | Admitting: *Deleted

## 2014-01-12 DIAGNOSIS — F321 Major depressive disorder, single episode, moderate: Secondary | ICD-10-CM | POA: Diagnosis not present

## 2014-01-24 ENCOUNTER — Encounter: Payer: Self-pay | Admitting: Internal Medicine

## 2014-01-24 ENCOUNTER — Ambulatory Visit (INDEPENDENT_AMBULATORY_CARE_PROVIDER_SITE_OTHER): Payer: Medicare Other | Admitting: Internal Medicine

## 2014-01-24 VITALS — BP 122/60 | HR 112 | Ht 63.0 in | Wt 152.6 lb

## 2014-01-24 DIAGNOSIS — I639 Cerebral infarction, unspecified: Secondary | ICD-10-CM | POA: Diagnosis not present

## 2014-01-24 DIAGNOSIS — R1310 Dysphagia, unspecified: Secondary | ICD-10-CM | POA: Diagnosis not present

## 2014-01-24 NOTE — Progress Notes (Signed)
Tamara [name] 1927/10/08 [id number]  Note: This dictation was prepared with Dragon digital system. Any transcriptional errors that result from this procedure are unintentional.   History of Present Illness: This is a 80 year old white female with several months history of dysphagia to predominantly solids. Specifically to chicken, french fries and cabbage. She has not had any episodes of actual regurgitation of food impaction. She complaints of cough were related to Cozaar. She denies any problems with liquids. She denies odynophagia. She points to upper throat and to lower chest.  We have done an upper endoscopy in April 2010 which showed mild gastritis. Her mother had esophageal cancer and patient is concerned about the possibility of having cancer.Upper GI series with barium swallow showed cervical osteophytes causing mild indentation of the of posterior esophagus. The peristalsis was normal. A she had a barium tablet but there was no comment by the radiologist  As to delay.. Patient has been on Plavix since  CVA and carotid artery stenosis. We have seen her in the past for colorectal screening. Last colonoscopy March 2014 showed adenomatous polyps. Colon was very torturous especially in the sigmoid colon. There is a family history of colon cancer in her sister and colon polyps in her father. Prior colonoscopies in 2004 and 2007 showed polyps.  Past Medical History  Diagnosis Date  . Hypertension   . Hypercholesterolemia   . GERD (gastroesophageal reflux disease)     EGD - gastritis/mild duodenitis, previous positive H. pylori  . Tubular adenoma of colon   . Fibrocystic breast disease   . Degenerative joint disease   . Osteopenia   . History of depression   . History of skin cancer     facial  . Vitamin D deficiency   . Chicken pox   . Glaucoma   . Migraines   . Diverticulitis   . Anemia   . Diabetes mellitus   . Helicobacter pylori ab+   . Colitis   . Internal hemorrhoids   .  Arthritis   . IBS (irritable bowel syndrome)   . Stroke   . Heart murmur   . Diverticulosis     Past Surgical History  Procedure Laterality Date  . Breast biopsy Bilateral     x4  . Appendectomy    . Bilateral salpingoophorectomy  2004    complex ovarian cyst (left) - benign  . Ventral hernia repair  2005    Dr. Tamala Julian  . Abdominal hysterectomy  1973    secondary to bleeding  . Mandible fracture surgery      Allergies  Allergen Reactions  . Shrimp [Shellfish Allergy]   . Vicodin [Hydrocodone-Acetaminophen]     Family history and social history have been reviewed.  Review of Systems: Solid food dysphagia. No odynophagia. Denies heartburn  The remainder of the 10 point ROS is negative except as outlined in the H&P  Physical Exam: General Appearance Well developed, in no distress Eyes  Non icteric  HEENT  Non traumatic, normocephalic  Mouth No lesion, tongue papillated, no cheilosis Neck Supple without adenopathy, thyroid not enlarged, no carotid bruits, no JVD Lungs Clear to auscultation bilaterally COR Normal S1, normal S2, regular rhythm, no murmur, quiet precordium Abdomen soft nontender Rectal not done Extremities  No pedal edema Skin No lesions Neurological Alert and oriented x 3 Psychological Normal mood and affect  Assessment and Plan:   80 year old white female with the solid food dysphagia and  barium esophagram which has suggested extrinsic compression of the posterior  esophagus by cervical osteophytes. Her dysphagia is predominantly to solids suggesting stricture . We will proceed with upper endoscopy and possibly dilation. She will remain on Plavix unless her PCP Dr Nicki Reaper  approves holding it for 5 days.    Delfin Edis 01/24/2014

## 2014-01-24 NOTE — Patient Instructions (Addendum)
You have been scheduled for an endoscopy. Please follow written instructions given to you at your visit today. If you use inhalers (even only as needed), please bring them with you on the day of your procedure. Your physician has requested that you go to www.startemmi.com and enter the access code given to you at your visit today. This web site gives a general overview about your procedure. However, you should still follow specific instructions given to you by our office regarding your preparation for the procedure.  Dr Einar Pheasant

## 2014-02-01 ENCOUNTER — Other Ambulatory Visit: Payer: Self-pay | Admitting: *Deleted

## 2014-02-01 ENCOUNTER — Telehealth: Payer: Self-pay | Admitting: Cardiovascular Disease

## 2014-02-01 ENCOUNTER — Telehealth: Payer: Self-pay | Admitting: Internal Medicine

## 2014-02-01 MED ORDER — CLOPIDOGREL BISULFATE 75 MG PO TABS: 75.0000 mg | ORAL_TABLET | Freq: Every day | ORAL | Status: DC

## 2014-02-01 NOTE — Telephone Encounter (Signed)
Error

## 2014-02-01 NOTE — Telephone Encounter (Signed)
Pt needs Clopidogrel Bisulfate to be sent in, and she has new insurance.   1. Which medications need to be refilled? Plavix   2. Which pharmacy is medication to be sent to? [facility] on [address]   3. Do they need a 30 day or 90 day supply? 90 day   4. Would they like a call back once the medication has been sent to the pharmacy? Yes

## 2014-02-01 NOTE — Telephone Encounter (Signed)
Plavix Rx sent to [id number]

## 2014-02-04 ENCOUNTER — Telehealth: Payer: Self-pay | Admitting: *Deleted

## 2014-02-04 NOTE — Telephone Encounter (Signed)
01/24/2014 RE: Tamara [name] DOB: 12-03-1927 MRN: [id number]  Dear Dr Nicki Reaper:   We have scheduled the above patient for an endoscopy procedure. Our records show that she is on anticoagulation therapy.  Please advise as to whether the patient may come off her therapy of Plavix prior to the procedure, which is scheduled for 03/02/14. Please route your response to Quintin Alto, CMA.   Sincerely,  Dixon Boos

## 2014-02-04 NOTE — Telephone Encounter (Signed)
I am seeing her on 02/08/14.  Will discuss with her more at that visit.  Thanks.

## 2014-02-08 ENCOUNTER — Ambulatory Visit (INDEPENDENT_AMBULATORY_CARE_PROVIDER_SITE_OTHER): Payer: Medicare Other | Admitting: Internal Medicine

## 2014-02-08 ENCOUNTER — Encounter: Payer: Self-pay | Admitting: Internal Medicine

## 2014-02-08 VITALS — BP 110/70 | HR 76 | Temp 97.9°F | Ht 63.0 in | Wt 152.5 lb

## 2014-02-08 DIAGNOSIS — R0989 Other specified symptoms and signs involving the circulatory and respiratory systems: Secondary | ICD-10-CM

## 2014-02-08 DIAGNOSIS — Z1239 Encounter for other screening for malignant neoplasm of breast: Secondary | ICD-10-CM | POA: Diagnosis not present

## 2014-02-08 DIAGNOSIS — I639 Cerebral infarction, unspecified: Secondary | ICD-10-CM

## 2014-02-08 DIAGNOSIS — K219 Gastro-esophageal reflux disease without esophagitis: Secondary | ICD-10-CM

## 2014-02-08 DIAGNOSIS — Z1231 Encounter for screening mammogram for malignant neoplasm of breast: Secondary | ICD-10-CM | POA: Diagnosis not present

## 2014-02-08 DIAGNOSIS — F32A Depression, unspecified: Secondary | ICD-10-CM

## 2014-02-08 DIAGNOSIS — R1310 Dysphagia, unspecified: Secondary | ICD-10-CM

## 2014-02-08 DIAGNOSIS — F329 Major depressive disorder, single episode, unspecified: Secondary | ICD-10-CM | POA: Diagnosis not present

## 2014-02-08 DIAGNOSIS — R739 Hyperglycemia, unspecified: Secondary | ICD-10-CM

## 2014-02-08 DIAGNOSIS — E7800 Pure hypercholesterolemia, unspecified: Secondary | ICD-10-CM

## 2014-02-08 DIAGNOSIS — I159 Secondary hypertension, unspecified: Secondary | ICD-10-CM

## 2014-02-08 DIAGNOSIS — I6523 Occlusion and stenosis of bilateral carotid arteries: Secondary | ICD-10-CM

## 2014-02-08 MED ORDER — LOSARTAN POTASSIUM 50 MG PO TABS: 25.0000 mg | ORAL_TABLET | Freq: Every day | ORAL | Status: DC

## 2014-02-08 NOTE — Progress Notes (Signed)
Pre visit review using our clinic review tool, if applicable. No additional management support is needed unless otherwise documented below in the visit note. 

## 2014-02-13 ENCOUNTER — Encounter: Payer: Self-pay | Admitting: Internal Medicine

## 2014-02-13 NOTE — Progress Notes (Signed)
Subjective:    Patient ID: Tamara [name], female    DOB: May 22, 1927, 81 y.o.   MRN: [id number]  HPI 81 year old female with past history of hypercholesterolemia, reflux disease (previous H. Pylori), FCD, glaucoma and colitis previously maintained on Pentasa.  She comes in today for a scheduled follow up.  Was admitted 09/21/12 with right arm tingling and expressive aphasia.  MRI revealed an abnormal signal in the left high posterior parietal area c/w acute ischemia.  Carotid ultrasound revealed no hemodynamically significant stenosis (per report).  ECHO revealed normal LV function with mild mitral valve and tricuspid valve regurgitation.  Symptoms improved.  Neurology consulted.  They were also concerned regarding the possibility of paroxysmal afib.  Had requested an event monitor.  She saw cardiology.  Wore a 30 day event monitor.  No significant arrythmia.  Stays active.  Feels good.   Breathing stable.  Bowels stable.  Seeing Dr Bridgett Larsson.  Has Buspar now to take prn.  On Lunesta.  Helping her sleep.  Feels better.  Feels she is doing better.  Tapering her paxil.  Taking crestor.  Tolerating.  States she is taking her medication as directed.  She also reports still noticing some difficulty swallowing.  Recent UGI with barium swallow revealed mild deformity involving the posterior cervical esophagus secondary to cervical vertebral endplate osteophytes.  Exam otherwise normal.   Saw GI.  planning for EGD.  We discussed stopping plavix for the procedure.  We discussed possible risk or reoccurring stroke with stopping her antiplatelet therapy.  She understands the risk and is willing to stop to proceed with EGD.  Affecting her eating.  No vomiting.     Past Medical History  Diagnosis Date  . Hypertension   . Hypercholesterolemia   . GERD (gastroesophageal reflux disease)     EGD - gastritis/mild duodenitis, previous positive H. pylori  . Tubular adenoma of colon   . Fibrocystic breast disease   .  Degenerative joint disease   . Osteopenia   . History of depression   . History of skin cancer     facial  . Vitamin D deficiency   . Chicken pox   . Glaucoma   . Migraines   . Diverticulitis   . Anemia   . Diabetes mellitus   . Helicobacter pylori ab+   . Colitis   . Internal hemorrhoids   . Arthritis   . IBS (irritable bowel syndrome)   . Stroke   . Heart murmur   . Diverticulosis     Outpatient Encounter Prescriptions as of 02/08/2014  Medication Sig  . aspirin 81 MG tablet Take 81 mg by mouth daily.  [name] azelastine (ASTELIN) 0.1 % nasal spray   . clopidogrel (PLAVIX) 75 MG tablet Take 1 tablet (75 mg total) by mouth daily.  . eszopiclone (LUNESTA) 2 MG TABS tablet Take 2 mg by mouth at bedtime as needed for sleep. Take immediately before bedtime  . losartan (COZAAR) 50 MG tablet Take 0.5 tablets (25 mg total) by mouth daily.  [name] PARoxetine (PAXIL) 30 MG tablet Take 25 mg by mouth daily.   . rosuvastatin (CRESTOR) 10 MG tablet TAKE 1 TABLET EVERYDAY  . [DISCONTINUED] losartan (COZAAR) 50 MG tablet Take 25 mg by mouth daily.     Review of Systems Patient denies any headache.  No lightheadedness or dizziness.  No sinus or allergy problems.   No chest pain, tightness.  No increased shortness of breath, cough or congestion.  No nausea  or vomiting.   Dysphagia as outlined.   No abdominal pain or cramping.  No BRBPR or melana.  No urine change.  Tolerating the low dose aspirin and taking plavix.  Understands risk of stopping antiplatelet therapy.  Tolerating crestor.  Overall feels better.  Stress is better.       Objective:   Physical Exam  Filed Vitals:   02/08/14 1438  BP: 110/70  Pulse: 76  Temp: 97.9 F (36.6 C)   Blood pressure recheck:  16-18/48  81 year old female in no acute distress.   HEENT:  Nares- clear.  Oropharynx - without lesions. NECK:  Supple.  Nontender.  HEART:  Appears to be regular. LUNGS:  No crackles or wheezing audible.  Respirations even and  unlabored.  RADIAL PULSE:  Equal bilaterally.  ABDOMEN:  Soft, nontender.  Bowel sounds present and normal.  No audible abdominal bruit.     EXTREMITIES:  No increased edema present.  DP pulses palpable and equal bilaterally.            Assessment & Plan:  1. Breast cancer screening - MM DIGITAL SCREENING BILATERAL; Future  2. Carotid stenosis, bilateral Mild bilateral carotid stenosis seen on ultrasound in 2014 [facility]).  Recommend continue aggressive cholesterol management.    3. CVA (cerebral vascular accident) Has been on aspirin and plavix since CVA 08/2012.  Having dysphagia.  Planning for EGD.  Understands risk of stopping plavix.   - CBC with Differential; Future  4. Secondary hypertension, unspecified Blood pressure under reasonable control.  Follow.   - Basic metabolic panel; Future  5. Gastroesophageal reflux disease, esophagitis presence not specified See above.  Saw GI. Planning for EGD.    6. Dysphagia Having trouble swallowing.  Saw GI.  Planning for EGD.  Understands risk of stopping plavix.  Wants to proceed.   - TSH; Future  7. Hypercholesterolemia On crestor and tolerating.  Follow.   - Lipid panel; Future - Hepatic function panel; Future  8. Left carotid bruit Carotid ultrasound as outlined.  Follow.    9. Depression Seeing Dr Bridgett Larsson.  Stable.    10. Hyperglycemia Low carb diet and exercise.  Follow.   - Hemoglobin A1c; Future  11. ABDOMINAL PAIN.  Resolved.  Has a history of colitis.  Evaluated by GI.  Had a recent colonoscopy.   States everything checked out fine.  Follow.    12. HERNIA. Saw Dr Tamala Julian.  S/p repair.  Follow.    13. CARDIOVASCULAR.  Stress test 02/25/07 revealed normal LV function with no evidence of any ischemia.  Known murmur.  Continue risk factor modification.  Recent cva.  Concern regarding the possibility of paroxysmal afib.  Saw Dr Rockey Situ.  Event monitor revealed no significant arrythmia.  Of note, ECHO in the hospital revealed  normal heart function and no significant valve abnormality.  No thrombus.     Ness.  Left carotid bruit.  Previous carotid ultrasound revealed no significant stenosis.  On aspirin and plavix.  HEALTH MAINTENANCE.  Physical 05/06/13.     S/P hysterectomy.  Mammogram 01/30/13 birads I.     I spent 25 minutes with the patient and more than 50% of the time was spent in consultation regarding the above.

## 2014-02-15 NOTE — Telephone Encounter (Signed)
I am going to let her neurologist know about the plavix and confirm he has no issues with her stopping.  I will send you a letter after contacting him.  Thanks.

## 2014-02-15 NOTE — Telephone Encounter (Signed)
No ma'am. We never stop ASA. Thank you for responding so promptly!

## 2014-02-15 NOTE — Telephone Encounter (Signed)
Dr Nicki Reaper, I see where you and the patient discussed d/c'ing plavix prior to procedure on 03/02/14. However, I need your written consent that she is okay to come off of Plavix specifically 5 days prior to her procedure. Please advise.Marland KitchenMarland Kitchen

## 2014-02-15 NOTE — Telephone Encounter (Signed)
I will send you something in writing about our discussion.  Does she need to also stop aspirin.  Plavix was the only thing mentioned.   Htanks.    Dr Nicki Reaper

## 2014-02-16 ENCOUNTER — Telehealth: Payer: Self-pay | Admitting: Cardiovascular Disease

## 2014-02-16 ENCOUNTER — Other Ambulatory Visit: Payer: Self-pay | Admitting: *Deleted

## 2014-02-16 MED ORDER — CLOPIDOGREL BISULFATE 75 MG PO TABS: 75.0000 mg | ORAL_TABLET | Freq: Every day | ORAL | Status: DC

## 2014-02-16 NOTE — Telephone Encounter (Signed)
°  1. Which medications need to be refilled? Plavix  2. Which pharmacy is medication to be sent to? [facility] [place] for about a week and then please send the rest to [place]  3. Do they need a 30 day or 90 day supply? Usually a 90 day supply sent to [facility]   4. Would they like a call back once the medication has been sent to the pharmacy? No [facility] will call

## 2014-02-16 NOTE — Telephone Encounter (Signed)
Plavix #7 R#1 sent to [facility]. Plavix #90 R#3 sent to [facility] in [place].

## 2014-02-22 ENCOUNTER — Ambulatory Visit: Payer: Self-pay | Admitting: Internal Medicine

## 2014-02-22 DIAGNOSIS — Z1231 Encounter for screening mammogram for malignant neoplasm of breast: Secondary | ICD-10-CM | POA: Diagnosis not present

## 2014-02-22 LAB — HM MAMMOGRAPHY: HM MAMMO: NEGATIVE

## 2014-02-23 NOTE — Telephone Encounter (Signed)
Dr Nicki Reaper, Sorry to bug you again, but this patient's endoscopy is currently scheduled for 2/31/16 and we normally ask that patient's hold anticoagulant 5 days prior to procedure. Therefore, I will need an answer by 02/25/14 or the patient may have to be rescheduled. Thank you.

## 2014-02-25 NOTE — Telephone Encounter (Signed)
I have spoken to patient to advise that Dr Nicki Reaper has contacted [place] to advise (verbally) that she may hold plavix 5 days prior to her procedure. Patient states that Dr Nicki Reaper contacted her yesterday to advise her of this and she has made note of it.

## 2014-02-25 NOTE — Telephone Encounter (Signed)
We received a confirmation phone call from [place] with Dr. Manuella Ghazi office notifying us that it is okay for Tamara [name] to go off her plavix to do her procedure. (Dr. Nicki Reaper notified verbally)

## 2014-02-25 NOTE — Telephone Encounter (Signed)
I have already notified the pt to stop her coumadin starting 02/25/14.  Neurology ok'd as well.  She is continuing to take her aspirin.  Pt aware of risk of stopping plavix and agreeable.

## 2014-03-02 ENCOUNTER — Encounter: Payer: Self-pay | Admitting: Internal Medicine

## 2014-03-02 ENCOUNTER — Ambulatory Visit (AMBULATORY_SURGERY_CENTER): Payer: Medicare Other | Admitting: Internal Medicine

## 2014-03-02 VITALS — BP 159/81 | HR 70 | Temp 97.8°F | Resp 36 | Ht 63.0 in | Wt 152.0 lb

## 2014-03-02 DIAGNOSIS — I10 Essential (primary) hypertension: Secondary | ICD-10-CM | POA: Diagnosis not present

## 2014-03-02 DIAGNOSIS — K219 Gastro-esophageal reflux disease without esophagitis: Secondary | ICD-10-CM | POA: Diagnosis not present

## 2014-03-02 DIAGNOSIS — R1310 Dysphagia, unspecified: Secondary | ICD-10-CM

## 2014-03-02 DIAGNOSIS — Z8673 Personal history of transient ischemic attack (TIA), and cerebral infarction without residual deficits: Secondary | ICD-10-CM | POA: Diagnosis not present

## 2014-03-02 DIAGNOSIS — K222 Esophageal obstruction: Secondary | ICD-10-CM

## 2014-03-02 MED ORDER — SODIUM CHLORIDE 0.9 % IV SOLN: 500.0000 mL | INTRAVENOUS | Status: DC

## 2014-03-02 NOTE — Progress Notes (Signed)
Called to room to assist during endoscopic procedure.  Patient ID and intended procedure confirmed with present staff. Received instructions for my participation in the procedure from the performing physician.  

## 2014-03-02 NOTE — Op Note (Addendum)
[place]  [address]. [place], [id number]   ENDOSCOPY PROCEDURE REPORT  PATIENT: Tamara [name], Tamara [name]  MR#: [id number] BIRTHDATE: 07/01/1927 , 42  yrs. old GENDER: female ENDOSCOPIST: Lafayette Dragon, MD REFERRED BY:  Dr Einar Pheasant PROCEDURE DATE:  03/02/2014 PROCEDURE:  EGD, diagnostic and Savary dilation of esophagus ASA CLASS:     Class II INDICATIONS:  dysphagia and her endoscopy April 2010 showed gastritis and mother had esophageal cancer.  Patient has been on Plavix.  Barium esophagrams show cervical osteophytes causing esophageal indentation..  No definite stricture. MEDICATIONS: Monitored anesthesia care and Propofol 200 mg IV TOPICAL ANESTHETIC: none  DESCRIPTION OF PROCEDURE: After the risks benefits and alternatives of the procedure were thoroughly explained, informed consent was obtained.  The LB [id number] endoscope was introduced through the mouth and advanced to the second portion of the duodenum , Without limitations.  The instrument was slowly withdrawn as the mucosa was fully examined.    Esophagus: proximal, mid and distal esophageal mucosa appeared normal there was a mild benign-appearing distal esophageal stricture which allowed the endoscope to traverse into a small hiatal hernia. There was no evidence of acute esophagitis Stomach: gastric folds were unremarkable. There was scattered tiny erosions covered with coffee-ground material mostly in the gastric antrum. Gastric outlet was normal. Retroflexion of the endoscope revealed normal fundus and cardia Duodenum: duodenal bulb and descending duodenum was normal Guidewire was then placed in the gastric antrum and Savary dilator passed over the guidewire without fluoroscopic guidance. 14 mm, 15 mm and 16 mm dilators passed without difficulty. There was no blood on the dilator [         The scope was then withdrawn from the patient and the procedure completed.  COMPLICATIONS:  There were no immediate complications.  ENDOSCOPIC IMPRESSION: 1.Mild nonobstructing distal esophageal stricture 2. Passage of 14,15 and 16 mm Savary dilators 3. Mild erosive gastritis  RECOMMENDATIONS: Anti-reflux regimen to be follow  REPEAT EXAM: no  eSigned:  Lafayette Dragon, MD 03/02/2014 8:40 AM Revised: 03/02/2014 8:40 AM   CC:  PATIENT NAME:  Nekeya, Briski MR#: [id number]

## 2014-03-02 NOTE — Patient Instructions (Addendum)

## 2014-03-02 NOTE — Progress Notes (Signed)
  [facility] Post-op Note  Patient: Tamara [name]  Procedure(s) Performed: endoscopy with dilatation  Patient Location: [facility] - Recovery Area  Anesthesia Type: Deep Sedation/Propofol  Level of Consciousness: awake, oriented and patient cooperative  Airway and Oxygen Therapy: Patient Spontanous Breathing  Post-op Pain: none  Post-op Assessment:  Post-op Vital signs reviewed, Patient's Cardiovascular Status Stable, Respiratory Function Stable, Patent Airway, No signs of Nausea or vomiting and Pain level controlled  Post-op Vital Signs: Reviewed and stable  Complications: No apparent anesthesia complications  Yadira Hada E 8:25 AM

## 2014-03-03 ENCOUNTER — Telehealth: Payer: Self-pay | Admitting: *Deleted

## 2014-03-03 NOTE — Telephone Encounter (Signed)
  Follow up Call-  Call back number 03/02/2014 04/21/2012  Post procedure Call Back phone  # [phone]  Permission to leave phone message Yes Yes     Patient questions:  Do you have a fever, pain , or abdominal swelling? No. Pain Score  0 *  Have you tolerated food without any problems? Yes.    Have you been able to return to your normal activities? Yes.    Do you have any questions about your discharge instructions: Diet   No. Medications  No. Follow up visit  No.  Do you have questions or concerns about your Care? No.  Actions: * If pain score is 4 or above: No action needed, pain <4.

## 2014-04-08 ENCOUNTER — Other Ambulatory Visit (INDEPENDENT_AMBULATORY_CARE_PROVIDER_SITE_OTHER): Payer: Medicare Other

## 2014-04-08 DIAGNOSIS — R739 Hyperglycemia, unspecified: Secondary | ICD-10-CM | POA: Diagnosis not present

## 2014-04-08 DIAGNOSIS — E7800 Pure hypercholesterolemia, unspecified: Secondary | ICD-10-CM

## 2014-04-08 DIAGNOSIS — R1310 Dysphagia, unspecified: Secondary | ICD-10-CM | POA: Diagnosis not present

## 2014-04-08 DIAGNOSIS — I159 Secondary hypertension, unspecified: Secondary | ICD-10-CM

## 2014-04-08 DIAGNOSIS — I639 Cerebral infarction, unspecified: Secondary | ICD-10-CM

## 2014-04-08 LAB — HEPATIC FUNCTION PANEL
ALBUMIN: 4.2 g/dL (ref 3.5–5.2)
ALT: 9 U/L (ref 0–35)
AST: 16 U/L (ref 0–37)
Alkaline Phosphatase: 66 U/L (ref 39–117)
Bilirubin, Direct: 0.1 mg/dL (ref 0.0–0.3)
TOTAL PROTEIN: 7.1 g/dL (ref 6.0–8.3)
Total Bilirubin: 0.4 mg/dL (ref 0.2–1.2)

## 2014-04-08 LAB — CBC WITH DIFFERENTIAL/PLATELET
Basophils Absolute: 0 10*3/uL (ref 0.0–0.1)
Basophils Relative: 0.6 % (ref 0.0–3.0)
EOS PCT: 1.9 % (ref 0.0–5.0)
Eosinophils Absolute: 0.1 10*3/uL (ref 0.0–0.7)
HCT: 37.8 % (ref 36.0–46.0)
HEMOGLOBIN: 12.5 g/dL (ref 12.0–15.0)
Lymphocytes Relative: 27.4 % (ref 12.0–46.0)
Lymphs Abs: 1.5 10*3/uL (ref 0.7–4.0)
MCHC: 33 g/dL (ref 30.0–36.0)
MCV: 83.8 fl (ref 78.0–100.0)
Monocytes Absolute: 0.3 10*3/uL (ref 0.1–1.0)
Monocytes Relative: 6.3 % (ref 3.0–12.0)
NEUTROS ABS: 3.4 10*3/uL (ref 1.4–7.7)
NEUTROS PCT: 63.8 % (ref 43.0–77.0)
Platelets: 378 10*3/uL (ref 150.0–400.0)
RBC: 4.52 Mil/uL (ref 3.87–5.11)
RDW: 14.6 % (ref 11.5–15.5)
WBC: 5.4 10*3/uL (ref 4.0–10.5)

## 2014-04-08 LAB — BASIC METABOLIC PANEL
BUN: 12 mg/dL (ref 6–23)
CHLORIDE: 104 meq/L (ref 96–112)
CO2: 28 meq/L (ref 19–32)
Calcium: 9.6 mg/dL (ref 8.4–10.5)
Creatinine, Ser: 0.77 mg/dL (ref 0.40–1.20)
GFR: 75.87 mL/min (ref >60.00)
GLUCOSE: 96 mg/dL (ref 70–99)
Potassium: 4.3 mEq/L (ref 3.5–5.1)
Sodium: 139 mEq/L (ref 135–145)

## 2014-04-08 LAB — LIPID PANEL
Cholesterol: 210 mg/dL — ABNORMAL HIGH (ref 0–200)
HDL: 66.2 mg/dL (ref >39.00)
LDL CALC: 125 mg/dL — AB (ref 0–99)
NonHDL: 143.8
TRIGLYCERIDES: 92 mg/dL (ref 0.0–149.0)
Total CHOL/HDL Ratio: 3
VLDL: 18.4 mg/dL (ref 0.0–40.0)

## 2014-04-08 LAB — HEMOGLOBIN A1C: Hgb A1c MFr Bld: 6.2 % (ref 4.6–6.5)

## 2014-04-08 LAB — TSH: TSH: 3.21 u[IU]/mL (ref 0.35–4.50)

## 2014-04-12 ENCOUNTER — Ambulatory Visit (INDEPENDENT_AMBULATORY_CARE_PROVIDER_SITE_OTHER): Payer: Medicare Other | Admitting: Internal Medicine

## 2014-04-12 ENCOUNTER — Encounter: Payer: Self-pay | Admitting: Internal Medicine

## 2014-04-12 VITALS — BP 130/70 | HR 72 | Temp 98.6°F | Ht 63.0 in | Wt 150.5 lb

## 2014-04-12 DIAGNOSIS — K219 Gastro-esophageal reflux disease without esophagitis: Secondary | ICD-10-CM

## 2014-04-12 DIAGNOSIS — R739 Hyperglycemia, unspecified: Secondary | ICD-10-CM

## 2014-04-12 DIAGNOSIS — E7800 Pure hypercholesterolemia, unspecified: Secondary | ICD-10-CM

## 2014-04-12 DIAGNOSIS — I159 Secondary hypertension, unspecified: Secondary | ICD-10-CM

## 2014-04-12 DIAGNOSIS — Z0000 Encounter for general adult medical examination without abnormal findings: Secondary | ICD-10-CM | POA: Diagnosis not present

## 2014-04-12 DIAGNOSIS — J3489 Other specified disorders of nose and nasal sinuses: Secondary | ICD-10-CM

## 2014-04-12 DIAGNOSIS — F329 Major depressive disorder, single episode, unspecified: Secondary | ICD-10-CM | POA: Diagnosis not present

## 2014-04-12 DIAGNOSIS — F32A Depression, unspecified: Secondary | ICD-10-CM

## 2014-04-12 DIAGNOSIS — R1310 Dysphagia, unspecified: Secondary | ICD-10-CM | POA: Diagnosis not present

## 2014-04-12 DIAGNOSIS — I639 Cerebral infarction, unspecified: Secondary | ICD-10-CM

## 2014-04-12 MED ORDER — ROSUVASTATIN CALCIUM 20 MG PO TABS: 20.0000 mg | ORAL_TABLET | Freq: Every day | ORAL | Status: DC

## 2014-04-12 MED ORDER — ROSUVASTATIN CALCIUM 10 MG PO TABS: 10.0000 mg | ORAL_TABLET | Freq: Every day | ORAL | Status: DC

## 2014-04-12 MED ORDER — MUPIROCIN 2 % EX OINT: 1.0000 "application " | TOPICAL_OINTMENT | Freq: Two times a day (BID) | CUTANEOUS | Status: DC

## 2014-04-12 NOTE — Progress Notes (Signed)
Patient ID: Tamara [name], female   DOB: [birth year], 81 y.o.   MRN: [id number]   Subjective:    Patient ID: Tamara [name], female    DOB: 04-19-1927, 81 y.o.   MRN: [id number]  HPI  Patient here for a scheduled follow up.  Is s/p EGD.  Had mild erosive gastritis and non obstructing stricture.  Swallowing better now.  No acid reflux.  Her main complaint is that of irritation - right nares.  It is red.  States feels like the skin is splitting.  Some itching - eyes.  The nose irritation started 1-2 weeks ago.  No increased sinus congestion or chest congestion.  No increased cough.  Breathing stable.  Eating and drinking well.     Past Medical History  Diagnosis Date  . Hypertension   . Hypercholesterolemia   . GERD (gastroesophageal reflux disease)     EGD - gastritis/mild duodenitis, previous positive H. pylori  . Tubular adenoma of colon   . Fibrocystic breast disease   . Degenerative joint disease   . Osteopenia   . History of depression   . History of skin cancer     facial  . Vitamin D deficiency   . Chicken pox   . Glaucoma   . Migraines   . Diverticulitis   . Anemia   . Helicobacter pylori ab+   . Colitis   . Internal hemorrhoids   . Arthritis   . IBS (irritable bowel syndrome)   . Stroke   . Heart murmur   . Diverticulosis     Current Outpatient Prescriptions on File Prior to Visit  Medication Sig Dispense Refill  . aspirin 81 MG tablet Take 81 mg by mouth daily.    [name] azelastine (ASTELIN) 0.1 % nasal spray   0  . clopidogrel (PLAVIX) 75 MG tablet Take 1 tablet (75 mg total) by mouth daily. 90 tablet 3  . eszopiclone (LUNESTA) 2 MG TABS tablet Take 2 mg by mouth at bedtime as needed for sleep. Take immediately before bedtime    . losartan (COZAAR) 50 MG tablet Take 0.5 tablets (25 mg total) by mouth daily. 90 tablet 1  . PARoxetine (PAXIL) 30 MG tablet Take 25 mg by mouth daily.      No current facility-administered medications on file prior to visit.     Review of Systems  Constitutional: Negative for appetite change and unexpected weight change.  HENT: Negative for congestion and sinus pressure.        Nasal irritation as outlined.  Worsening.  Present for 1-2 weeks.    Eyes: Positive for itching. Negative for pain.  Respiratory: Negative for cough, chest tightness and shortness of breath.   Cardiovascular: Negative for chest pain, palpitations and leg swelling.  Gastrointestinal: Negative for nausea, vomiting, abdominal pain and diarrhea.  Skin: Negative for rash.       Redness - nares - right  Neurological: Negative for dizziness, light-headedness and headaches.       Objective:    Physical Exam  Constitutional: She appears well-developed and well-nourished. No distress.  HENT:  Mouth/Throat: Oropharynx is clear and moist.  Increased erythema - right nares  With surrounding erythema - just outside the nose.  Some crusting.   Neck: Neck supple. No thyromegaly present.  Cardiovascular: Normal rate and regular rhythm.   Pulmonary/Chest: Breath sounds normal. No respiratory distress. She has no wheezes.  Abdominal: Soft. Bowel sounds are normal. There is no tenderness.  Musculoskeletal: She exhibits no  edema or tenderness.  Lymphadenopathy:    She has no cervical adenopathy.    BP 130/70 mmHg  Pulse 72  Temp(Src) 98.6 F (37 C) (Oral)  Ht _0  (1.6 m)  Wt 150 lb 8 oz (68.266 kg)  BMI 26.67 kg/m2  SpO2 96%  LMP 01/17/1972 Wt Readings from Last 3 Encounters:  04/12/14 150 lb 8 oz (68.266 kg)  03/02/14 152 lb (68.947 kg)  02/08/14 152 lb 8 oz (69.174 kg)     Lab Results  Component Value Date   WBC 5.4 04/08/2014   HGB 12.5 04/08/2014   HCT 37.8 04/08/2014   PLT 378.0 04/08/2014   GLUCOSE 96 04/08/2014   CHOL 210* 04/08/2014   TRIG 92.0 04/08/2014   HDL 66.20 04/08/2014   LDLDIRECT 177.7 01/19/2013   LDLCALC 125* 04/08/2014   ALT 9 04/08/2014   AST 16 04/08/2014   NA 139 04/08/2014   K 4.3 04/08/2014    CL 104 04/08/2014   CREATININE 0.77 04/08/2014   BUN 12 04/08/2014   CO2 28 04/08/2014   TSH 3.21 04/08/2014   HGBA1C 6.2 04/08/2014    Dg Lumbar Spine 2-3 Views  06/02/2013   CLINICAL DATA:  back pain  EXAM: LUMBAR SPINE - 2-3 VIEW  COMPARISON:  None.  FINDINGS: There is no evidence of lumbar spine fracture. Alignment is normal. Disc space narrowing at L2-3, L3-4. Atherosclerotic calcifications within the aorta.  IMPRESSION: No acute osseous abnormalities. Degenerative disc disease changes at L2-3 and L3-4.   Electronically Signed   By: Margaree Mackintosh M.D.   On: 06/02/2013 13:22       Assessment & Plan:   Problem List Items Addressed This Visit    CVA (cerebral vascular accident)    Admitted with CVA 8/14.  On aspirin and plavix.  Doing well.  Follow.        Relevant Medications   rosuvastatin (CRESTOR) tablet   Depression    Doing well.  Follow.       Dysphagia    Better s/p EGD.  Follow.        GERD (gastroesophageal reflux disease)    Symptoms better s/p EGD.  EGD 03/02/14 with mild erosive gastritis and stricture.  Follow.        Health care maintenance    Physical 05/06/13.  Mammogram 02/22/14 - birads I.        Hypercholesterolemia    Low cholesterol diet and exercise.  On crestor.  Follow lipid panel and liver function tests.  04/08/14 - 125.  Increase to 57m q day.  Follow.       Relevant Medications   rosuvastatin (CRESTOR) tablet   Other Relevant Orders   Hepatic function panel   Lipid panel   Hypertension - Primary    Blood pressure doing well.  Same medication regimen.  Follow pressures.  Follow met b.       Relevant Medications   rosuvastatin (CRESTOR) tablet   Other Relevant Orders   Basic metabolic panel   Internal nasal lesion    Redness and crusting as outlined.  bactroban ointment as directed.  Notify me if persistent.  May need ENT evaluation.         Other Visit Diagnoses    Hyperglycemia        Relevant Orders    Hemoglobin A1c      I  spent 25 minutes with the patient and more than 50% of the time was spent in consultation regarding the above.  Kristle Wesch, MD   

## 2014-04-12 NOTE — Progress Notes (Signed)
Pre visit review using our clinic review tool, if applicable. No additional management support is needed unless otherwise documented below in the visit note. 

## 2014-04-17 ENCOUNTER — Encounter: Payer: Self-pay | Admitting: Internal Medicine

## 2014-04-17 DIAGNOSIS — Z0000 Encounter for general adult medical examination without abnormal findings: Secondary | ICD-10-CM | POA: Insufficient documentation

## 2014-04-17 NOTE — Assessment & Plan Note (Signed)
Physical 05/06/13.  Mammogram 02/22/14 - birads I.

## 2014-04-17 NOTE — Assessment & Plan Note (Signed)
Better s/p EGD.  Follow.

## 2014-04-17 NOTE — Assessment & Plan Note (Signed)
Redness and crusting as outlined.  bactroban ointment as directed.  Notify me if persistent.  May need ENT evaluation.

## 2014-04-17 NOTE — Assessment & Plan Note (Signed)
Low cholesterol diet and exercise.  On crestor.  Follow lipid panel and liver function tests.  04/08/14 - 125.  Increase to 20mg  q day.  Follow.

## 2014-04-17 NOTE — Assessment & Plan Note (Signed)
Admitted with CVA 8/14.  On aspirin and plavix.  Doing well.  Follow.

## 2014-04-17 NOTE — Assessment & Plan Note (Signed)
Doing well.  Follow.  

## 2014-04-17 NOTE — Assessment & Plan Note (Signed)
Symptoms better s/p EGD.  EGD 03/02/14 with mild erosive gastritis and stricture.  Follow.

## 2014-04-17 NOTE — Assessment & Plan Note (Signed)
Blood pressure doing well.  Same medication regimen.  Follow pressures.  Follow met b.

## 2014-05-12 DIAGNOSIS — F339 Major depressive disorder, recurrent, unspecified: Secondary | ICD-10-CM | POA: Diagnosis not present

## 2014-05-17 ENCOUNTER — Encounter: Payer: Self-pay | Admitting: Cardiovascular Disease

## 2014-05-17 ENCOUNTER — Ambulatory Visit (INDEPENDENT_AMBULATORY_CARE_PROVIDER_SITE_OTHER): Payer: Medicare Other | Admitting: Cardiovascular Disease

## 2014-05-17 VITALS — BP 150/64 | HR 76 | Ht 63.0 in | Wt 150.8 lb

## 2014-05-17 DIAGNOSIS — R011 Cardiac murmur, unspecified: Secondary | ICD-10-CM

## 2014-05-17 DIAGNOSIS — I4901 Ventricular fibrillation: Secondary | ICD-10-CM

## 2014-05-17 DIAGNOSIS — I6523 Occlusion and stenosis of bilateral carotid arteries: Secondary | ICD-10-CM

## 2014-05-17 DIAGNOSIS — F329 Major depressive disorder, single episode, unspecified: Secondary | ICD-10-CM | POA: Diagnosis not present

## 2014-05-17 DIAGNOSIS — I159 Secondary hypertension, unspecified: Secondary | ICD-10-CM | POA: Diagnosis not present

## 2014-05-17 DIAGNOSIS — I498 Other specified cardiac arrhythmias: Secondary | ICD-10-CM

## 2014-05-17 DIAGNOSIS — I639 Cerebral infarction, unspecified: Secondary | ICD-10-CM

## 2014-05-17 DIAGNOSIS — F32A Depression, unspecified: Secondary | ICD-10-CM

## 2014-05-17 DIAGNOSIS — E785 Hyperlipidemia, unspecified: Secondary | ICD-10-CM | POA: Insufficient documentation

## 2014-05-17 MED ORDER — ROSUVASTATIN CALCIUM 20 MG PO TABS: 20.0000 mg | ORAL_TABLET | Freq: Every day | ORAL | Status: DC

## 2014-05-17 NOTE — Assessment & Plan Note (Signed)
She is taking crestor every other day for financial reasons. She reports intolerance to lipitor and zocor. Samples provided.Crestor to go generic this fall.

## 2014-05-17 NOTE — Patient Instructions (Addendum)
You are doing well.  Please increase the losartan up to one a day (50 mg daily), for high blood pressure  Please call us if you have new issues that need to be addressed before your next appt.  Your physician wants you to follow-up in: 12 months.  You will receive a reminder letter in the mail two months in advance. If you don't receive a letter, please call our office to schedule the follow-up appointment.

## 2014-05-17 NOTE — Assessment & Plan Note (Signed)
Mild disease b/l. Continue statin.

## 2014-05-17 NOTE — Assessment & Plan Note (Signed)
Mood seems dramatically better on today's visit compared to prior office visit. Encouraged her to continue her daily walking

## 2014-05-17 NOTE — Assessment & Plan Note (Signed)
We have recommended that she increase her losartan up to 50 mg daily And continue to monitor her blood pressure at home

## 2014-05-17 NOTE — Assessment & Plan Note (Signed)
Recommended that she continue on her aspirin and Plavix No recent TIA or stroke symptoms

## 2014-05-17 NOTE — Assessment & Plan Note (Signed)
I suspect she has aortic valve sclerosis without significant stenosis. We'll continue to monitor the intensity of her murmur. Echocardiogram 2 years ago with no significant valve disease

## 2014-05-17 NOTE — Progress Notes (Signed)
Patient ID: Tamara [name], female    DOB: [birth year], 81 y.o.   MRN: [id number]  HPI Comments: Tamara [name] is a very pleasant 81 year old woman with hyperlipidemia, hypertension,  Previous Stroke August 2014 . Mild carotid plaque bilaterally seen Depression She presents today for follow-up of her blood pressure and history of stroke  In follow-up, she reports that she feels well. She is active, no regular exercise program that his walking daily with ADLs and chores. Goes shopping frequently. Denies having any complaints. No palpitations, no lightheadedness or dizziness, no chest pain or shortness of breath. She reports Crestor is expensive for her on her new insurance plan, $400 at the start of the year. She has decreased the dosing to every other day to save money. She continues to have problems with insomnia, has to "take something" She reports that her daughter checks her blood pressure periodically. Sometimes it is high, sometimes okay  EKG on today's visit shows normal sinus rhythm with rate 76 bpm, nonspecific ST abnormality anterolateral leads  Other past medical history  admitted to the hospital August 25 and discharged 09/22/2012. Diagnosis of embolic infarct in the left parietal area with right hand paresthesias and expressive aphasia.  no documented atrial fibrillation though significant concern given history of fluttering and palpitations.  30 day Holter did not show any significant arrhythmia  residual deficits have returned to normal.  On aspirin and Plavix with no further symptoms  Echocardiogram 09/22/2012 shows ejection fraction 55-60% mild mild MR and TR, normal right ventricular systolic pressures bubble study was not performed to look for PFO EKG in the hospital showed normal sinus rhythm with rate 61 beats per minute, essentially normal MRI of the brain showed abnormal signal in the left high posterior parietal region consistent with acute ischemia Carotid ultrasound  showed no hemodynamic significance. There is calcified mural plaque identified on the right and left, less than 50%   Allergies  Allergen Reactions  . Shrimp [Shellfish Allergy]   . Vicodin [Hydrocodone-Acetaminophen]     Outpatient Encounter Prescriptions as of 05/17/2014  Medication Sig  . aspirin 81 MG tablet Take 81 mg by mouth daily.  . clopidogrel (PLAVIX) 75 MG tablet Take 1 tablet (75 mg total) by mouth daily.  [name] losartan (COZAAR) 50 MG tablet Take 0.5 tablets (25 mg total) by mouth daily.  [name] PARoxetine (PAXIL) 30 MG tablet Take 25 mg by mouth daily.   . rosuvastatin (CRESTOR) 20 MG tablet Take 1 tablet (20 mg total) by mouth daily.  . [DISCONTINUED] azelastine (ASTELIN) 0.1 % nasal spray   . [DISCONTINUED] eszopiclone (LUNESTA) 2 MG TABS tablet Take 2 mg by mouth at bedtime as needed for sleep. Take immediately before bedtime  . [DISCONTINUED] mupirocin ointment (BACTROBAN) 2 % Place 1 application into the nose 2 (two) times daily. (Patient not taking: Reported on 05/17/2014)  . [DISCONTINUED] rosuvastatin (CRESTOR) 20 MG tablet Take 1 tablet (20 mg total) by mouth daily. (Patient not taking: Reported on 05/17/2014)    Past Medical History  Diagnosis Date  . Hypertension   . Hypercholesterolemia   . GERD (gastroesophageal reflux disease)     EGD - gastritis/mild duodenitis, previous positive H. pylori  . Tubular adenoma of colon   . Fibrocystic breast disease   . Degenerative joint disease   . Osteopenia   . History of depression   . History of skin cancer     facial  . Vitamin D deficiency   . Chicken pox   .  Glaucoma   . Migraines   . Diverticulitis   . Anemia   . Helicobacter pylori ab+   . Colitis   . Internal hemorrhoids   . Arthritis   . IBS (irritable bowel syndrome)   . Stroke   . Heart murmur   . Diverticulosis     Past Surgical History  Procedure Laterality Date  . Breast biopsy Bilateral     x4  . Appendectomy    . Bilateral salpingoophorectomy   2004    complex ovarian cyst (left) - benign  . Ventral hernia repair  2005    Dr. Tamala Julian  . Abdominal hysterectomy  1973    secondary to bleeding  . Mandible fracture surgery      Social History  reports that she has never smoked. She has never used smokeless tobacco. She reports that she does not drink alcohol or use illicit drugs.  Family History family history includes Cirrhosis in her brother; Colon cancer in her sister; Esophageal cancer in her mother; Heart disease in her father; Ovarian cancer in her sister; Parkinson's disease in her brother; Stomach cancer in her mother.     Review of Systems  Constitutional: Negative.   HENT: Negative.   Eyes: Negative.   Respiratory: Negative.   Cardiovascular: Negative.   Gastrointestinal: Negative.   Endocrine: Negative.   Musculoskeletal: Negative.   Skin: Negative.   Allergic/Immunologic: Negative.   Neurological: Negative.   Hematological: Negative.   Psychiatric/Behavioral: Negative.   All other systems reviewed and are negative.   BP 150/64 mmHg  Pulse 76  Ht 5\' 3"  (1.6 m)  Wt 150 lb 12 oz (68.38 kg)  BMI 26.71 kg/m2  LMP 01/17/1972  Physical Exam  Constitutional: She is oriented to person, place, and time. She appears well-developed and well-nourished.  HENT:  Head: Normocephalic.  Nose: Nose normal.  Mouth/Throat: Oropharynx is clear and moist.  Eyes: Conjunctivae are normal. Pupils are equal, round, and reactive to light.  Neck: Normal range of motion. Neck supple. No JVD present.  Cardiovascular: Normal rate, regular rhythm, S1 normal, S2 normal and intact distal pulses.  Exam reveals no gallop and no friction rub.   Murmur heard.  Systolic murmur is present with a grade of 2/6  Pulmonary/Chest: Effort normal and breath sounds normal. No respiratory distress. She has no wheezes. She has no rales. She exhibits no tenderness.  Abdominal: Soft. Bowel sounds are normal. She exhibits no distension. There is no  tenderness.  Musculoskeletal: Normal range of motion. She exhibits no edema or tenderness.  Lymphadenopathy:    She has no cervical adenopathy.  Neurological: She is alert and oriented to person, place, and time. Coordination normal.  Skin: Skin is warm and dry. No rash noted. No erythema.  Psychiatric: She has a normal mood and affect. Her behavior is normal. Judgment and thought content normal.    Assessment and Plan  Nursing note and vitals reviewed.

## 2014-05-20 NOTE — Op Note (Signed)
PATIENT NAME:  Tamara [name], Tamara [name] MR#:  [id number] DATE OF BIRTH:  1927/08/24  DATE OF PROCEDURE:  01/31/2012  PREOPERATIVE DIAGNOSIS: Ventral hernia.   POSTOPERATIVE DIAGNOSIS: Ventral hernia.   PROCEDURE: Ventral hernia repair.   SURGEON: Rochel Brome, MD  ANESTHESIA: General.   INDICATION: This 81 year old female recently had bulging in the right lower abdomen. This was the site where she had had a laparoscopic port inserted during previous surgery. She also has had a previous open appendectomy. The hernia was found at the port site creating a bulge of some 4 cm and surgery was recommended for definitive treatment.   DESCRIPTION OF PROCEDURE: The patient was placed on the operating table, in the supine position, under general anesthesia. The right lower abdomen was prepared with ChloraPrep and draped in a sterile manner.   Just below and lateral to the old McBurney incision, at the site of an old laparoscopic port site, an obliquely oriented incision was made approximately 5 cm in length and carried down through subcutaneous tissues. The Scarpa's fascia was incised, a number of small bleeding points were cauterized and hemostasis was subsequently intact. The external oblique aponeurosis was incised along the course of its fibers to expose a ventral hernia sac which was dissected free from surrounding structures. It was approximately 4 cm in length and it was inverted and separated from the fascial ring defect. The ring defect itself was approximately 10 mm and was closed with interrupted 0 Maxon figure-of-eight sutures. Next, the external oblique aponeurosis was closed over this with interrupted 4-0 Vicryl figure-of-eight sutures. The deep fascia surrounding the repair site was infiltrated with 0.5% Sensorcaine with epinephrine. Scarpa's fascia was closed      with 4-0 Vicryl. The skin was closed with running 5-0 Monocryl subcuticular suture and Dermabond. The patient tolerated surgery  satisfactorily and was then prepared for transfer to the recovery room.  ____________________________ Lenna Sciara. Rochel Brome, MD jws:sb D: 01/31/2012 13:11:38 ET T: 01/31/2012 14:09:07 ET JOB#: [id number]  cc: Loreli Dollar, MD, <Dictator> Loreli Dollar MD ELECTRONICALLY SIGNED 02/03/2012 19:31

## 2014-05-20 NOTE — Consult Note (Signed)
PATIENT NAME:  LLUVIA, GWYNNE MR#:  [id number] DATE OF BIRTH:  [birth year]  DATE OF CONSULTATION:  09/22/2012  REFERRING PHYSICIAN:   CONSULTING PHYSICIAN:  Corey Skains, MD  REASON FOR CONSULTATION: Hypertension, hyperlipidemia, with transient ischemic attack, concerning for cardiovascular disease.   CHIEF COMPLAINT: "I had some weakness."   HISTORY OF PRESENT ILLNESS: This is an 81 year old female with hypertension and hyperlipidemia, who has had new onset of right arm tingling, weakness, with some concerns of dizziness. She was seen in the Emergency Room at that time and had a full workup showing no evidence of carotid atherosclerosis significant and no evidence of stroke. She had resolution of her symptoms and was placed on antiplatelet medication management, stable at this time. The patient has had no rhythm disturbances and an EKG showing normal sinus rhythm, normal EKG, and no current evidence of elevation of troponin or myocardial infarction. She has now ambulated well without any further issues, and there are concerns of other cardiovascular risks, although no evidence of heart block.   REVIEW OF SYSTEMS: The remainder of review of systems negative for vision change, ringing in the ears, hearing loss, cough, congestion, heartburn, nausea, vomiting, diarrhea, bloody stools, stomach pain, extremity pain, leg weakness, cramps of the buttocks, known blood clots, headaches, blackouts, nosebleeds, congestion, trouble swallowing, frequent urination, urination at night, muscle weakness, numbness, anxiety, depression, skin lesions or skin rashes.   PAST MEDICAL HISTORY:  1. Hypertension.  2. Hyperlipidemia.   FAMILY HISTORY: No family members with early onset of cardiovascular disease or hypertension.   SOCIAL HISTORY: Currently denies alcohol or tobacco use.   ALLERGIES: As listed.   MEDICATIONS: As listed.   PHYSICAL EXAMINATION:  VITAL SIGNS: Blood pressure is 132/68 bilaterally,  heart rate 72 upright, reclining and regular. GENERAL: She is a well-appearing female in no acute distress.  HEAD, EYES, EARS, NOSE AND THROAT: No icterus, thyromegaly, ulcers, hemorrhage or xanthelasma.  CARDIOVASCULAR: Regular rate and rhythm with normal S1 and S2, without murmur, gallop or rub. PMI is normal size and placement. Carotid upstroke normal, without bruit. Jugular venous pressure is normal.  LUNGS: Have a few basilar crackles, with normal respirations.  ABDOMEN: Soft, nontender, without hepatosplenomegaly or masses. Abdominal aorta is normal size, without bruit.  EXTREMITIES: Show 2+ bilateral pulses in dorsal, pedal, radial and femoral arteries, without lower extremity edema, cyanosis, clubbing or ulcers.  NEUROLOGICAL: She is oriented to time, place and person, with normal mood and affect.   ASSESSMENT: An 81 year old female with hypertension, hyperlipidemia and a transient ischemic attack, on appropriate medications, resolved at this time, with no current evidence of rhythm disturbances or other causes from the cardiac standpoint of her current condition.   RECOMMENDATIONS:  1. Ambulate and follow for any further significant symptoms. 2. Okay for discharge to home.  3. Further diagnostic testing, including the possibility of echocardiogram for LV systolic dysfunction and valvular heart disease.  4. Possible outpatient stress test or monitor to assess for rhythm disturbances and/or atrial fibrillation causing above.   ____________________________ Corey Skains, MD bjk:OSi D: 09/23/2012 13:05:35 ET T: 09/23/2012 13:49:25 ET JOB#: [id number]  cc: Corey Skains, MD, <Dictator> Corey Skains MD ELECTRONICALLY SIGNED 10/07/2012 14:25

## 2014-05-20 NOTE — Consult Note (Signed)
Referring Physician:  Sainani, Vivek J :   Primary Care Physician:  Sainani, Vivek J : [facility], [address], [place], [place] [address], [phone]  Reason for Consult: Admit Date: 22-Sep-2012  Chief Complaint: R arm numbness  Reason for Consult: CVA   History of Present Illness: History of Present Illness:   81 yo RHD F presents with sudden onset of R arm numbness and tingling.  Pt was not a tPA candidate because she woke up with symptoms.  Nothing allievates or potentiates this.  She denies weakness, vision changes or problems with vision.  No headache  ROS:  General denies complaints   HEENT no complaints   Lungs no complaints   Cardiac no complaints   GI no complaints   GU no complaints   Musculoskeletal no complaints   Extremities no complaints   Skin no complaints   Neuro numbness/tingling   Endocrine no complaints   Psych anxiety   Past Medical/Surgical Hx:  Facial Skin CA:   Depression:   Migraines:   Left Ovarian Cyst:   Colon Polyps:   Duodenitis:   Gastritis:   H Pylori:   GERD:   Osteopenia:   Dejenerative Joint Disease:   Fibrocystic Breast Disease:   Hyperlipidemia:   Diverticulitis:   Hernia Repair:   Hernia Repair:   cyst removed from breast:   Appendectomy:   Hysterectomy - Total:   Past Medical/ Surgical Hx:  Past Medical History see above   Past Surgical History see above   Home Medications: Medication Instructions Last Modified Date/Time  paroxetine 20 mg oral tablet 1 tab(s) orally once a day in am 25-Aug-14 19:52  Ambien CR 6.25 mg oral tablet, extended release 1 tab(s) orally once a day (at bedtime) 25-Aug-14 19:52   Allergies:  Percocet: Swelling, Rash  Flagyl: Other  Vicodin: N/V/Diarrhea  Steroids: GI Distress  Shellfish: N/V/Diarrhea  Allergies:  Allergies see above   Social/Family History: Employment Status: retired  Lives With: children  Living Arrangements: house  Social  History: no tob, no Etoh, no illicits  Family History: strong family hx of strokes and CAD   Vital Signs: **Vital Signs.:   26-Aug-14 07:42  Vital Signs Type Routine  Temperature Temperature (F) 97.9  Celsius 36.6  Pulse Pulse 66  Systolic BP Systolic BP 116  Diastolic BP (mmHg) Diastolic BP (mmHg) 65  Mean BP 82  Pulse Ox % Pulse Ox % 96  Pulse Ox Activity Level  At rest  Oxygen Delivery Room Air/ 21 %   Physical Exam: General: overweight, NAD, resting comfortable  HEENT: normocephalic, sclera nonicteric, oropharynx clear  Neck: supple, no JVD, no bruits  Chest: CTA B, no wheezing, good movement  Cardiac: RRR, no murmurs, no edema, 2+ pulses  Extremities: varicose veins, no C/C; FROM   Neurologic Exam: Mental Status: alert and oriented x 3, normal speech and language, follows complex commands  Cranial Nerves: PERRLA, EOMI, nl VF, face symmetric, tongue midline, shoulder shrug equal  Motor Exam: 5/5 B normal, tone, no tremor  Deep Tendon Reflexes: 2+/4 B, plantars downgoing B, no Hoffman  Sensory Exam: pinprick, temperature, and vibration intact B  Coordination: FTN and HTS WNL, nl RAM, nl gait   Lab Results:  Thyroid:  26-Aug-14 04:13   Thyroid Stimulating Hormone  5.37 (0.45-4.50 (International Unit)  ----------------------- Pregnant patients have  different reference  ranges for TSH:  - - - - - - - - - -  Pregnant, first trimetser:  0.36 -   2.50 uIU/mL)  Hepatic:  25-Aug-14 12:09   Bilirubin, Total 0.3  Alkaline Phosphatase 84  SGPT (ALT) 15  SGOT (AST) 21  Total Protein, Serum 7.4  Albumin, Serum 3.6  Routine Chem:  26-Aug-14 04:13   Hemoglobin A1c ([facility]) 6.1 (The American Diabetes Association recommends that a primary goal of therapy should be <7% and that physicians should reevaluate the treatment regimen in patients with HbA1c values consistently >8%.)  Cholesterol, Serum  307  Triglycerides, Serum 86  HDL (INHOUSE) 59  VLDL Cholesterol Calculated 17   LDL Cholesterol Calculated  231 (Result(s) reported on 22 Sep 2012 at 05:07AM.)  Glucose, Serum 97  BUN 15  Creatinine (comp) 0.68  Sodium, Serum 139  Potassium, Serum 4.1  Chloride, Serum 107  CO2, Serum 27  Calcium (Total), Serum 9.0  Anion Gap  5  Osmolality (calc) 278  eGFR (African American) >60  eGFR (Non-African American) >60 (eGFR values <60mL/min/1.73 m2 may be an indication of chronic kidney disease (CKD). Calculated eGFR is useful in patients with stable renal function. The eGFR calculation will not be reliable in acutely ill patients when serum creatinine is changing rapidly. It is not useful in  patients on dialysis. The eGFR calculation may not be applicable to patients at the low and high extremes of body sizes, pregnant women, and vegetarians.)  Cardiac:  25-Aug-14 12:09   CK, Total 65  CPK-MB, Serum 0.7 (Result(s) reported on 21 Sep 2012 at 12:52PM.)  Troponin I < 0.02 (0.00-0.05 0.05 ng/mL or less: NEGATIVE  Repeat testing in 3-6 hrs  if clinically indicated. >0.05 ng/mL: POTENTIAL  MYOCARDIAL INJURY. Repeat  testing in 3-6 hrs if  clinically indicated. NOTE: An increase or decrease  of 30% or more on serial  testing suggests a  clinically important change)  Routine UA:  25-Aug-14 12:55   Color (UA) Straw  Clarity (UA) Clear  Glucose (UA) Negative  Bilirubin (UA) Negative  Ketones (UA) Negative  Specific Gravity (UA) 1.009  Blood (UA) Negative  pH (UA) 6.0  Protein (UA) Negative  Nitrite (UA) Negative  Leukocyte Esterase (UA) Negative (Result(s) reported on 21 Sep 2012 at 01:14PM.)  RBC (UA) 1 /HPF  WBC (UA) 1 /HPF  Bacteria (UA) NONE SEEN  Epithelial Cells (UA) NONE SEEN  Mucous (UA) PRESENT  Hyaline Cast (UA) 2 /LPF (Result(s) reported on 21 Sep 2012 at 01:14PM.)  Routine Hem:  26-Aug-14 04:13   WBC (CBC) 5.6  RBC (CBC) 4.21  Hemoglobin (CBC) 12.1  Hematocrit (CBC) 35.5  Platelet Count (CBC) 318  MCV 84  MCH 28.9  MCHC 34.2  RDW  14.3  Neutrophil % 50.8  Lymphocyte % 38.3  Monocyte % 8.4  Eosinophil % 1.6  Basophil % 0.9  Neutrophil # 2.9  Lymphocyte # 2.2  Monocyte # 0.5  Eosinophil # 0.1  Basophil # 0.0 (Result(s) reported on 22 Sep 2012 at 05:02AM.)   Radiology Results: US:    25-Aug-14 17:41, US Carotid Doppler Bilateral  US Carotid Doppler Bilateral   REASON FOR EXAM:    Acute CVA  COMMENTS:       PROCEDURE: US  - US CAROTID DOPPLER BILATERAL  - Sep 21 2012  5:41PM     RESULT:     Findings: Calcified mural plaque is identified within the right and left   internal carotid arteries demonstrating less than 50% visual stenosis.   Color flow and SPECTRAL waveform imaging is unremarkable within the right   and left carotid systems.      ICA/CCA ratios:    Right: 1.35  Left: 1.13    Antegrade flow is identified within the right and left vertebral arteries.    IMPRESSION:  No sonographic evidence of hemodynamically significant   stenosis within the right or left carotid systems.    Thank you for the opportunity to contribute to the care of your patient.         Verified By: Mikki Santee, M.D., MD  MRI:    25-Aug-14 14:46, MRI Brain Without Contrast  MRI Brain Without Contrast   REASON FOR EXAM:    numbness rue  COMMENTS:       PROCEDURE: MR  - MR BRAIN WO CONTRAST  - Sep 21 2012  2:46PM     RESULT: Multiplanar images were obtained through the brain without   administration of gadolinium.    The diffusion-weighted images exhibit multiple areas of abnormal   increased signal in the cortex of the high left parietal lobe   posteriorly. There is also increased signal in the gray-white junction in   the posterior parietal lobe on the left. There is no shift of the   midline. There is no abnormal signal on the right nor in the cerebellum.    The inversion recovery/FLAIR sequences reveal a moderate amount of     increased white matter signal in the periventricular regions and more    peripherally in both cerebral hemispheresconsistent with chronic small   vessel ischemic type change. There is minimal increased signal within the   pons as well. The T1 and T2 weighted spin echo images reveal the   ventricles to be normal in size and position. Cranial nerves VII and VIII   appear normal at the level of the cerebellopontine angles there is a   small amount of fluid in the left sphenoid sinus cell.    IMPRESSION:   1. There is abnormal signal in the left high posterior parietal   gray-white junction more peripheral cortex consistent acute ischemia.   There are no findings suspicious for acute intracranial hemorrhage.  2. There is no intracranial mass effect or hydrocephalus.  3. The cerebellum and brainstem exhibit no acute abnormalities.  4. There is increased whitematter signal in both cerebral hemispheres     and in the pons consistent with chronic small vessel ischemic change.     Dictation Site: 1        Verified By: DAVID A. Martinique, M.D., MD  CT:    25-Aug-14 12:34, CT Head Without Contrast  CT Head Without Contrast   REASON FOR EXAM:    numbness right arm  COMMENTS:       PROCEDURE: CT  - CT HEAD WITHOUT CONTRAST  - Sep 21 2012 12:34PM     RESULT: Axial noncontrast CT scanning was performed through the brain   with reconstructions at 5 mm intervals and slice thicknesses. Comparison   is made to a study of April 28, 2010.    The ventricles are normal in size and position. There is decreased   density in the deep white matter both cerebral hemispheres which appears   stable and is consistent with chronic small vessel ischemia. There are   stable punctate basal ganglia calcifications on the right. The cerebellum   and brainstem are normal in density. There is no evidence of an acute   intracranial hemorrhage nor of an evolving ischemic infarction.  At bone window settings the observed portions of the paranasal sinuses   and mastoid  air cells are clear.  There is no evidence of an acute skull   fracture.    IMPRESSION:   1. There are hypodensities in the deep white matter of both cerebral   hemispheres consistent with chronic small vessel ischemia. There is no   evidence of an acute ischemic infarction.  2. There is no acute intracranial hemorrhage.  3. There is no intracranial mass effect nor hydrocephalus.     Dictation Site: 1        Verified By: DAVID A. JORDAN, M.D., MD   Radiology Impression: Radiology Impression: MRI of brain personally reviewed by me and shows a small L parietal watershed acute infarct, there are mild old white matter changes as well   Impression/Recommendations: Recommendations:   labs reviewed by me notes reviewed by me US normal   Acute L parietal infarct-  etiology is most likely cardioembolic and we suspect paroxysmal atrial fibrillation in this pt because she notes palpatations;  symptoms from this stroke have mostly resolved Palpatations-  suspect paroxysmal atrial fibrillation Hyperlipidemia-  uncontrolled secondary to noncomplaince with medications echo pending but will not change managment needs 60-day event monitor to look for atrial fibrillation start ASA 81mg daily restart Pravachol or other statin with goal LDL < 100 BP goal should be < 130/80 as outpatient can go home today but should f/u with [facility] in 2-3 months  Electronic Signatures for Addendum Section:  Mcgaughey,  C (MD) (Signed Addendum 26-Aug-14 14:12)  echo normal EF and L atrial size.  still needs Event monitor x 60 days   Electronic Signatures: Pignato,  C (MD)  (Signed 26-Aug-14 11:10)  Authored: REFERRING PHYSICIAN, Primary Care Physician, Consult, History of Present Illness, Review of Systems, PAST MEDICAL/SURGICAL HISTORY, HOME MEDICATIONS, ALLERGIES, Social/Family History, NURSING VITAL SIGNS, Physical Exam-, LAB RESULTS, RADIOLOGY RESULTS, Recommendations   Last Updated: 26-Aug-14 14:12 by Guercio,  C (MD) 

## 2014-05-20 NOTE — Discharge Summary (Signed)
PATIENT NAME:  Tamara [name], Tamara [name] MR#:  [id number] DATE OF BIRTH:  [birth year]  DATE OF ADMISSION:  09/21/2012 DATE OF DISCHARGE:  09/22/2012  ADMISSION DIAGNOSIS:  Stroke.  DISCHARGE DIAGNOSES 1.  Stroke, acute, suspected embolic with infract in left parietal area with right hand paresthesias as well as expressive aphasia.  2.  Hyperlipidemia with LDL of 231.   3.  Hypertension.  4.  Questionable hypothyroidism versus sick euthyroid syndrome.  5.  Palpitation, questionable atrial fibrillation.  6.  History of anxiety/depression.   DISCHARGE CONDITION:  Stable.   DISCHARGE MEDICATIONS: The patient is to resume paroxetine 20 mg p.o. daily, Ambien CR 6.25 mg p.o. at bedtime.  New medications are atorvastatin 40 mg p.o. daily, aspirin 325 mg p.o. daily and Cozaar 25 p.o. once daily.   HOME OXYGEN: None.   DIET: Two grams salt, low fat, low cholesterol.  Diet consistency mechanical soft, advanced to regular as tolerated.   ACTIVITY LIMITATION: (Dictation Anomaly) as tolerated. Referral is made to outpatient speech therapy.   FOLLOWUP:  Appointment with Dr. Einar Pheasant in 2 days after discharge, Dr. Valora Corporal, neurologist at [facility], in 1 month after discharge as well as Dr. Nehemiah Massed in 1 to 2 weeks after discharge.   CONSULTANTS:  Dr. Valora Corporal, care management.    RADIOLOGIC STUDIES: Chest, portable, single view September 21, 2012, revealed no acute disease of the chest. CT scan of head without contrast September 21, 2012, revealed hypodensities in deep white matter in both cerebral hemispheres consistent with chronic small vessel ischemia, no evidence of acute ischemic infarction, no acute intracranial hemorrhage, no intracranial mass effect, no hydrocephalus according to radiologist. MRI of brain without contrast September 21, 2012, showed abnormal signal in the left high posterior parietal gray-white junction more peripheral cortex consistent with acute ischemia, no findings suspicious for acute  intracranial hemorrhage. There is no intracranial mass effect or hydrocephalus. Cerebellum and brain stem  exhibited no acute abnormalities. There was increased white matter signal in both cerebral hemispheres and in the pons consistent with chronic small vessel ischemic change according to radiologist. Ultrasound of carotid arteries September 21, 2012, revealed no sonographic evidence of hemodynamically significant stenosis within the right or left carotid systems, antegrade flow was identified in the right as well as left vertebral arteries. Echocardiogram on September 22, 2012, showed left ventricular ejection fraction by visual estimation 55% to 60%, normal global left ventricular systolic function, mild mitral valve regurgitation, mild tricuspid regurgitation.    HISTORY OF PRESENT ILLNESS: The patient is an 81 year old Caucasian female with past medical history significant for history of hypertension as well as hyperlipidemia, who presented to the hospital with complaints of right arm numbness as well as tingling and inability to speak, inability to express herself.  On arrival to the Emergency Room, the patient was severely  hypertensive with systolic blood pressure of 200/86, temperature was 98.2, pulse was 64 and O2 sats were 97% on room air. Physical exam was unremarkable. The patient had decreased sensation on the right hand as well as right forearm as compared to the left; however, no other sensory deficits were noted in lower extremities. The patient's lab data done on admission, September 21, 2012, showed normal BMP, normal liver enzymes, normal cardiac enzymes and normal CBC. Urinalysis was also unremarkable. EKG showed normal sinus rhythm at 61 beats per minute, normal axis, no acute ST-T changes were noted.   HOSPITAL COURSE:  The patient was admitted to the hospital for further evaluation. Consultation  with Dr. Valora Corporal was obtained. Neurology, Dr. Valora Corporal, saw the patient in consultation on  September 22, 2012. He felt that the patient had acute left parietal infarct, etiology of which he felt was cardioembolic and he suspected paroxysmal atrial fibrillation in this patient because of the patient's concern of palpitations. Dr. Valora Corporal also felt that patient's symptoms from the stroke have mostly resolved. In regards to palpitations, he suspected paroxysmal A. fib.  For hyperlipidemia, he felt it was uncontrolled secondary to noncompliance with medications.  He recommended to get 60-day event monitor to look for A. fib as well as start aspirin therapy and start Pravachol or any other statin with the goal LDL of less than 100. According to Dr. Valora Corporal, blood pressure goal should be less than 130/80 as outpatient. Recommended to discharge the patient home with followup with [facility] neurology in the next 2 to 3 months after discharge. The patient had risk factors for stroke evaluated. She had a lipid panel done which showed LDL of 231, total cholesterol of 307, triglycerides of 86, HDL 59. Hemoglobin A1c was found to be 6.1. The patient TSH was found to be elevated at 5.37, however, it was felt that the patient's TSH should be rechecked to make sure that she is not hypothyroid.  At this point, since she came with acute illness, we were concerned about possible  sick euthyroid and we made decision not to start her on Synthroid yet.  I would recommend to get repeated TSH as well as free T4 as well as free T3 to make sure the patient is hypothyroid.  In regards to hyperlipidemia, we started the patient on atorvastatin.  It is recommended to follow the patient's lipid panel as outpatient. In regards to hypertension, the patient is to continue low dose of Cozaar, however, the patient was advised to check her blood pressure readings closely and make sure that she reports her blood pressure readings to her primary care physician. In regards to palpitations, the patient was evaluated by Dr. Nehemiah Massed, who did  not feel the patient would benefit from 60-day monitor; however, he recommended to follow up with him as outpatient for further recommendations. For history of anxiety/depression, the patient is to continue her usual management.   The patient is being discharged in stable condition with the above-mentioned medications and followup.   VITAL SIGNS ON THE DAY OF DISCHARGE: Temperature 97.9, pulse was 66, respiratory rate was 18, blood pressure 116/65, saturation was 96% on room air at rest.   TIME SPENT: 40 minutes.    ____________________________ Theodoro Grist, MD rv:cs D: 09/22/2012 18:59:00 ET T: 09/22/2012 19:39:56 ET JOB#: [id number]  cc: Einar Pheasant, MD Mila Homer. Tamala Julian, MD Corey Skains, MD Theodoro Grist, MD, <Dictator>  Aziz Slape Ether Griffins MD ELECTRONICALLY SIGNED 10/17/2012 17:52

## 2014-05-20 NOTE — H&P (Signed)
PATIENT NAME:  Tamara [name], KARN MR#:  [id number] DATE OF BIRTH:  [birth year]  DATE OF ADMISSION:  09/21/2012  PRIMARY CARE PHYSICIAN: Dr. Einar Pheasant.   CHIEF COMPLAINT: Right arm numbness and tingling.   HISTORY OF PRESENT ILLNESS: This is an 81 year old female who presented to the hospital due to arm numbness and tingling that began around 8:00 last night. The patient went to sleep, but woke up this morning and still continued to have some right arm numbness and tingling. She apparently was out of some of her prescriptions. She usually gets it from mail-order. She was calling to place an order, although she was not dialing the right numbers. She recognized that she was not dialing the right. numbers She called her daughter, who attempted to ask her what is going on, and the patient could not get the right words out. The daughter then called EMS and she was brought to the hospital. The patient's disorientation has since then significantly improved. She also still has some right hand and forearm numbness, but it is also much improved since last night. Her MRI did confirm a left parietal acute CVA. Hospitalist services were contacted for further treatment and evaluation. The patient did say that she had a mild headache when she developed her right arm numbness last night, but no blurry vision, no nausea, no vomiting, no weakness and no other associated symptoms.   REVIEW OF SYSTEMS:    CONSTITUTIONAL: No documented fever. No weight gain. No weight loss.  EYES: No blurred or double vision.  EARS, NOSE, THROAT: No tinnitus. No postnasal drip. No redness of the oropharynx.  RESPIRATORY: No cough, no wheeze, no hemoptysis, no dyspnea.  CARDIOVASCULAR: No chest pain, no orthopnea, no palpitations, no syncope.  GASTROINTESTINAL: No nausea, no vomiting, no diarrhea, no abdominal pain, no melena, no hematochezia.  GENITOURINARY: No dysuria or hematuria.  ENDOCRINE: No polyuria or nocturia, heat or cold  intolerance.  HEMATOLOGIC: No anemia, no bruising, no bleeding.  INTEGUMENTARY: No rashes. No lesions.  MUSCULOSKELETAL: No arthritis, no swelling, no gout.  NEUROLOGIC: Positive numbness. No ataxia. No seizure-type activity.  PSYCHIATRIC: Positive anxiety. No insomnia. No ADD.   PAST MEDICAL HISTORY: Consistent with hypertension, hyperlipidemia, anxiety.   ALLERGIES: FLAGYL, PERCOCET, STEROIDS, VICODIN AND SHELLFISH.   SOCIAL HISTORY: No smoking. No alcohol abuse. No illicit drug abuse. Lives by herself.   FAMILY HISTORY: The patient's mother died from complications of esophageal cancer. Father died from complications of coronary artery disease.   CURRENT MEDICATIONS: Losartan 50 mg daily (although she has not been taking it), Ambien 6.25 mg extended-release daily, Paxil 20 mg daily, Pravachol 20 mg daily, tramadol 50 mg every 4 hours as needed for pain.   PHYSICAL EXAMINATION: VITAL SIGNS: Presently noted to be: Temperature is 98.2, pulse 64, respirations 18, blood pressure 200/86, sats 97% on room air.  GENERAL: She is a pleasant-appearing female in no apparent distress.  HEENT: Atraumatic, normocephalic. Extraocular muscles are intact. Pupils equal and reactive to light. Sclerae anicteric. No conjunctival injection. No pharyngeal erythema.  NECK: Supple. There is no jugular venous distention. No bruits. No lymphadenopathy. No thyromegaly.  HEART: Regular rate and rhythm. She does have a 2/6 systolic ejection murmur heard at the right sternal border. No rubs, no clicks.  LUNGS: Clear to auscultation bilaterally. No rales, no rhonchi, no wheezes.  ABDOMEN: Soft, flat, nontender, nondistended. Has good bowel sounds. No hepatosplenomegaly appreciated.  EXTREMITIES: No evidence of any cyanosis, clubbing or peripheral edema. Has +2 pedal  and radial pulses bilaterally.  NEUROLOGICAL: The patient is alert, awake, oriented x 3. No focal motor deficits appreciated bilaterally. She does have  decreased sensation on her right hand and right forearm as compared to her left, but no other sensory deficits of the lower extremities appreciated bilaterally.  SKIN: Moist and warm with no rashes.  LYMPHATIC: There is no cervical or axillary lymphadenopathy.   LABORATORY AND RADIOLOGICAL DATA: Serum glucose of 94, BUN 13, creatinine 0.6, sodium 137, potassium 3.8, chloride 105, bicarbonate 27. LFTs are within normal limits. Troponin less than 0.02. White cell count 6.2, hemoglobin 13.2, hematocrit 39.6, platelet count 353. Urinalysis is within normal limits.   The patient did have a CT of the head done without contrast, which showed no acute intracranial hemorrhage. No intracranial mass or hydrocephalus. No evidence of acute ischemic infarction. The patient also had a chest x-ray done, which showed no acute cardiopulmonary disease. The patient had an MRI of the brain done without contrast showing abnormal signal in the left high posterior parietal gray-white junction, more peripheral cortex, consistent with acute ischemia. No findings suspicious for acute intracranial hemorrhage. No mass effect or hydrocephalus. The brainstem and cerebellum exhibit no acute abnormalities.   ASSESSMENT AND PLAN: This is an 81 year old female with a history of hypertension, anxiety, hyperlipidemia, presented to the hospital due to right arm numbness and tingling and also feeling a bit disoriented, and MRI confirming an acute parietal cerebrovascular accident.  1.  Acute cerebrovascular accident:  This is likely the cause of the patient's paresthesias and disorientation, her clinical symptoms although have significantly improved since they began around 8:00 last night. The patient was not taking any antiplatelet agents prior to coming in the hospital; therefore, I will start the patient on aspirin for now. I will also restart the patient's statin. Will check a lipid profile. Will get a 2-dimensional echo and also a carotid  duplex, follow q.4 hour neuro checks. Also get a neurology consult. I discussed the case with Dr. Jayme Cloud who will see the patient tomorrow.  2.  Malignant hypertension: The patient apparently is noncompliant with her home medications. I will restart her losartan for now Place her on p.r.n. hydralazine. Her goal systolic blood pressure should be in the 170s. Will follow hemodynamics.   3.  Hyperlipidemia: I will resume her Pravachol. Check a lipid profile in the morning. 4.  Anxiety: Continue with her Paxil and continue with her Ambien at bedtime.   CODE STATUS: The patient is a full code.   TIME SPENT: 45 minutes.    ____________________________ Belia Heman. Verdell Carmine, MD vjs:jm D: 09/21/2012 16:08:53 ET T: 09/21/2012 17:19:37 ET JOB#: [id number]  cc: Belia Heman. Verdell Carmine, MD, <Dictator> Henreitta Leber MD ELECTRONICALLY SIGNED 10/10/2012 15:25

## 2014-07-19 ENCOUNTER — Telehealth: Payer: Self-pay | Admitting: Internal Medicine

## 2014-07-19 NOTE — Telephone Encounter (Signed)
Spoke with pt, advised of MDs message.  Pt verbalized understanding 

## 2014-07-19 NOTE — Telephone Encounter (Signed)
I spoke with pt, she states she is fine.  States she had the episodes yesterday and all symptoms have resolved.  Pt advised it symptoms return or worsen to go to ER or Urgent Care.  Pt verbalized understanding.

## 2014-07-19 NOTE — Telephone Encounter (Signed)
With her history of CVA, I feel she should be evaluated.  Let he know that I am out of the office.  She has seen Dr Manuella Ghazi with [facility] - for her CVA.  Would recommend her notify him of symptoms (or [place] notify) - he may want to see her or give other recommendations regarding treatment.

## 2014-07-19 NOTE — Telephone Encounter (Signed)
[facility]  Patient Name: Tamara [name]  DOB: 12-17-1927    Initial Comment Caller states mother has had 2 episodes of numbing sensation in her arm and headaches.    Nurse Assessment      Guidelines    Guideline Title Affirmed Question Affirmed Notes       Final Disposition User   FINAL ATTEMPT MADE - message left Wynetta Emery, RN, Baker Janus    Comments  Attempted to call Candice 3 times and once back to back for a total of 4 calls no answer left message if still needs to discuss mother's care please call back.

## 2014-08-08 ENCOUNTER — Other Ambulatory Visit (INDEPENDENT_AMBULATORY_CARE_PROVIDER_SITE_OTHER): Payer: Medicare Other

## 2014-08-08 DIAGNOSIS — R739 Hyperglycemia, unspecified: Secondary | ICD-10-CM

## 2014-08-08 DIAGNOSIS — I159 Secondary hypertension, unspecified: Secondary | ICD-10-CM

## 2014-08-08 DIAGNOSIS — E7800 Pure hypercholesterolemia, unspecified: Secondary | ICD-10-CM

## 2014-08-08 LAB — BASIC METABOLIC PANEL
BUN: 15 mg/dL (ref 6–23)
CO2: 27 mEq/L (ref 19–32)
Calcium: 9.7 mg/dL (ref 8.4–10.5)
Chloride: 105 mEq/L (ref 96–112)
Creatinine, Ser: 0.72 mg/dL (ref 0.40–1.20)
GFR: 81.91 mL/min (ref >60.00)
Glucose, Bld: 97 mg/dL (ref 70–99)
Potassium: 4.1 mEq/L (ref 3.5–5.1)
Sodium: 139 mEq/L (ref 135–145)

## 2014-08-08 LAB — LIPID PANEL
Cholesterol: 211 mg/dL — ABNORMAL HIGH (ref 0–200)
HDL: 66.2 mg/dL (ref >39.00)
LDL Cholesterol: 128 mg/dL — ABNORMAL HIGH (ref 0–99)
NonHDL: 144.8
Total CHOL/HDL Ratio: 3
Triglycerides: 85 mg/dL (ref 0.0–149.0)
VLDL: 17 mg/dL (ref 0.0–40.0)

## 2014-08-08 LAB — HEPATIC FUNCTION PANEL
ALBUMIN: 4 g/dL (ref 3.5–5.2)
ALK PHOS: 73 U/L (ref 39–117)
ALT: 23 U/L (ref 0–35)
AST: 32 U/L (ref 0–37)
Bilirubin, Direct: 0.1 mg/dL (ref 0.0–0.3)
Total Bilirubin: 0.4 mg/dL (ref 0.2–1.2)
Total Protein: 7.3 g/dL (ref 6.0–8.3)

## 2014-08-08 LAB — HEMOGLOBIN A1C: HEMOGLOBIN A1C: 6 % (ref 4.6–6.5)

## 2014-08-10 ENCOUNTER — Ambulatory Visit (INDEPENDENT_AMBULATORY_CARE_PROVIDER_SITE_OTHER): Payer: Medicare Other | Admitting: Internal Medicine

## 2014-08-10 ENCOUNTER — Encounter: Payer: Self-pay | Admitting: Internal Medicine

## 2014-08-10 VITALS — BP 120/70 | HR 69 | Temp 98.3°F | Ht 63.0 in | Wt 150.4 lb

## 2014-08-10 DIAGNOSIS — Z0000 Encounter for general adult medical examination without abnormal findings: Secondary | ICD-10-CM | POA: Diagnosis not present

## 2014-08-10 DIAGNOSIS — I6523 Occlusion and stenosis of bilateral carotid arteries: Secondary | ICD-10-CM | POA: Diagnosis not present

## 2014-08-10 DIAGNOSIS — I639 Cerebral infarction, unspecified: Secondary | ICD-10-CM

## 2014-08-10 DIAGNOSIS — R0989 Other specified symptoms and signs involving the circulatory and respiratory systems: Secondary | ICD-10-CM

## 2014-08-10 DIAGNOSIS — E7800 Pure hypercholesterolemia, unspecified: Secondary | ICD-10-CM

## 2014-08-10 DIAGNOSIS — F32A Depression, unspecified: Secondary | ICD-10-CM

## 2014-08-10 DIAGNOSIS — F329 Major depressive disorder, single episode, unspecified: Secondary | ICD-10-CM

## 2014-08-10 DIAGNOSIS — I159 Secondary hypertension, unspecified: Secondary | ICD-10-CM

## 2014-08-10 DIAGNOSIS — K219 Gastro-esophageal reflux disease without esophagitis: Secondary | ICD-10-CM | POA: Diagnosis not present

## 2014-08-10 DIAGNOSIS — R739 Hyperglycemia, unspecified: Secondary | ICD-10-CM

## 2014-08-10 MED ORDER — LOSARTAN POTASSIUM 50 MG PO TABS: 50.0000 mg | ORAL_TABLET | Freq: Every day | ORAL | Status: DC

## 2014-08-10 NOTE — Assessment & Plan Note (Signed)
Physical today 08/10/14.  mammogram 02/22/14 - Birads I.

## 2014-08-10 NOTE — Progress Notes (Signed)
Pre visit review using our clinic review tool, if applicable. No additional management support is needed unless otherwise documented below in the visit note. 

## 2014-08-10 NOTE — Progress Notes (Signed)
Patient ID: Tamara [name], female   DOB: 1927-09-02, 81 y.o.   MRN: [id number]   Subjective:    Patient ID: Tamara [name], female    DOB: 29-Jul-1927, 81 y.o.   MRN: [id number]  HPI  Patient here to follow up on her current medical issues as well as for a complete physical exam.  She just recently saw Dr Rockey Situ.  He gave her sam;ples of crestor.  She is almost out.  Not able to afford the name brand crestor.  Cholesterol is better.  She has had intolerance to other statin medications.  We discussed other options.  Dr Rockey Situ increased her losartan to 50mg  q day.  Blood pressure has been under reasonable control (per her report).  Stays active.  No cardiac symptoms with increased activity or exertion.  No sob.  Eating and drinking well.  Bowels stable.  Has a history of migraine headaches.  These have been present for years.  When occur, she notices a squiggly line and then will have numbness in her hand and arm and right side of her face.  May only occur 1-2x/year.  No change in symptoms.  No significant headache.  No reoccurring TIA or CVA symptoms.     Past Medical History  Diagnosis Date  . Hypertension   . Hypercholesterolemia   . GERD (gastroesophageal reflux disease)     EGD - gastritis/mild duodenitis, previous positive H. pylori  . Tubular adenoma of colon   . Fibrocystic breast disease   . Degenerative joint disease   . Osteopenia   . History of depression   . History of skin cancer     facial  . Vitamin D deficiency   . Chicken pox   . Glaucoma   . Migraines   . Diverticulitis   . Anemia   . Helicobacter pylori ab+   . Colitis   . Internal hemorrhoids   . Arthritis   . IBS (irritable bowel syndrome)   . Stroke   . Heart murmur   . Diverticulosis     Outpatient Encounter Prescriptions as of 08/10/2014  Medication Sig  . aspirin 81 MG tablet Take 81 mg by mouth daily.  . clopidogrel (PLAVIX) 75 MG tablet Take 1 tablet (75 mg total) by mouth daily.  [name] losartan  (COZAAR) 50 MG tablet Take 1 tablet (50 mg total) by mouth daily.  [name] PARoxetine (PAXIL) 30 MG tablet Take 25 mg by mouth daily.   . rosuvastatin (CRESTOR) 20 MG tablet Take 1 tablet (20 mg total) by mouth daily.  . temazepam (RESTORIL) 15 MG capsule Take 15 mg by mouth at bedtime.  . [DISCONTINUED] losartan (COZAAR) 50 MG tablet Take 0.5 tablets (25 mg total) by mouth daily.  . [DISCONTINUED] rosuvastatin (CRESTOR) 20 MG tablet Take 1 tablet (20 mg total) by mouth daily.   No facility-administered encounter medications on file as of 08/10/2014.    Review of Systems  Constitutional: Negative for appetite change and unexpected weight change.  HENT: Negative for congestion and sinus pressure.   Respiratory: Negative for cough, chest tightness and shortness of breath.   Cardiovascular: Negative for chest pain, palpitations and leg swelling.  Gastrointestinal: Negative for nausea, vomiting, abdominal pain and diarrhea.  Genitourinary: Negative for dysuria and difficulty urinating.  Musculoskeletal: Negative for back pain and joint swelling.  Skin: Negative for color change and rash.  Neurological: Negative for dizziness, light-headedness and headaches.  Psychiatric/Behavioral: Negative for dysphoric mood and agitation.  Objective:     Blood pressure recheck:  138-140/78  Physical Exam  Constitutional: She appears well-developed and well-nourished. No distress.  HENT:  Nose: Nose normal.  Mouth/Throat: Oropharynx is clear and moist.  Neck: Neck supple. No thyromegaly present.  Cardiovascular: Normal rate and regular rhythm.   Pulmonary/Chest: Breath sounds normal. No respiratory distress. She has no wheezes.  Abdominal: Soft. Bowel sounds are normal. There is no tenderness.  Musculoskeletal: She exhibits no edema or tenderness.  Lymphadenopathy:    She has no cervical adenopathy.  Skin: No rash noted. No erythema.  Psychiatric: She has a normal mood and affect. Her behavior is  normal.    BP 120/70 mmHg  Pulse 69  Temp(Src) 98.3 F (36.8 C) (Oral)  Ht 5\' 3"  (1.6 m)  Wt 150 lb 6 oz (68.21 kg)  BMI 26.64 kg/m2  SpO2 96%  LMP 01/17/1972 Wt Readings from Last 3 Encounters:  08/10/14 150 lb 6 oz (68.21 kg)  05/17/14 150 lb 12 oz (68.38 kg)  04/12/14 150 lb 8 oz (68.266 kg)     Lab Results  Component Value Date   WBC 5.4 04/08/2014   HGB 12.5 04/08/2014   HCT 37.8 04/08/2014   PLT 378.0 04/08/2014   GLUCOSE 97 08/08/2014   CHOL 211* 08/08/2014   TRIG 85.0 08/08/2014   HDL 66.20 08/08/2014   LDLDIRECT 177.7 01/19/2013   LDLCALC 128* 08/08/2014   ALT 23 08/08/2014   AST 32 08/08/2014   NA 139 08/08/2014   K 4.1 08/08/2014   CL 105 08/08/2014   CREATININE 0.72 08/08/2014   BUN 15 08/08/2014   CO2 27 08/08/2014   TSH 3.21 04/08/2014   HGBA1C 6.0 08/08/2014       Assessment & Plan:   Problem List Items Addressed This Visit    Carotid stenosis    Mild disease.  Continue antiplatelet therapy and statin medication.        Relevant Medications   losartan (COZAAR) 50 MG tablet   rosuvastatin (CRESTOR) 20 MG tablet   CVA (cerebral vascular accident)    Recommended that she continue aspirin and plavix.  No recent TIA or stroke symptoms.  Continue risk factor modification.        Relevant Medications   losartan (COZAAR) 50 MG tablet   rosuvastatin (CRESTOR) 20 MG tablet   Depression    Mood better.  Doing well on paxil.  Follow.       GERD (gastroesophageal reflux disease)    EGD 03/02/14 - mild erosive gastritis and stricture.  No upper symptoms now.  Follow.        Health care maintenance - Primary    Physical today 08/10/14.  mammogram 02/22/14 - Birads I.       Hypercholesterolemia    Low cholesterol diet and exercise.  On crestor.  LDL better.  Did not tolerate lipitor.  Pravastatin did not control pressure.  She is tolerating the crestor.  Unable to afford.  Will see if crestor has gone generic.  If not, will change to lovastatin.   She is in agreement.        Relevant Medications   losartan (COZAAR) 50 MG tablet   rosuvastatin (CRESTOR) 20 MG tablet   Other Relevant Orders   Lipid panel   Hepatic function panel   Hyperglycemia    Low carb diet.  Will follow.        Relevant Orders   Hemoglobin A1c   Hypertension    Blood pressure  under better control.  On losartan 50mg  q day now.  Follow.  Follow metabolic panel.        Relevant Medications   losartan (COZAAR) 50 MG tablet   rosuvastatin (CRESTOR) 20 MG tablet   Other Relevant Orders   Basic metabolic panel   Left carotid bruit    Recent carotid ultrasound in the hospital revealed no hemodynamically significant stenosis.  Continue daily aspirin and risk factor modification.          I spent 25 minutes with the patient and more than 50% of the time was spent in consultation regarding the above.     Einar Pheasant, MD

## 2014-08-11 ENCOUNTER — Encounter: Payer: Self-pay | Admitting: Internal Medicine

## 2014-08-11 DIAGNOSIS — R739 Hyperglycemia, unspecified: Secondary | ICD-10-CM | POA: Insufficient documentation

## 2014-08-11 MED ORDER — ROSUVASTATIN CALCIUM 20 MG PO TABS: 20.0000 mg | ORAL_TABLET | Freq: Every day | ORAL | Status: DC

## 2014-08-11 NOTE — Assessment & Plan Note (Signed)
Blood pressure under better control.  On losartan 50mg  q day now.  Follow.  Follow metabolic panel.

## 2014-08-11 NOTE — Assessment & Plan Note (Signed)
Recommended that she continue aspirin and plavix.  No recent TIA or stroke symptoms.  Continue risk factor modification.

## 2014-08-11 NOTE — Assessment & Plan Note (Signed)
Low carb diet.  Will follow.

## 2014-08-11 NOTE — Assessment & Plan Note (Signed)
Mild disease.  Continue antiplatelet therapy and statin medication.

## 2014-08-11 NOTE — Assessment & Plan Note (Signed)
Recent carotid ultrasound in the hospital revealed no hemodynamically significant stenosis.  Continue daily aspirin and risk factor modification.

## 2014-08-11 NOTE — Assessment & Plan Note (Signed)
EGD 03/02/14 - mild erosive gastritis and stricture.  No upper symptoms now.  Follow.

## 2014-08-11 NOTE — Assessment & Plan Note (Signed)
Mood better.  Doing well on paxil.  Follow.

## 2014-08-11 NOTE — Assessment & Plan Note (Signed)
Low cholesterol diet and exercise.  On crestor.  LDL better.  Did not tolerate lipitor.  Pravastatin did not control pressure.  She is tolerating the crestor.  Unable to afford.  Will see if crestor has gone generic.  If not, will change to lovastatin.  She is in agreement.

## 2014-08-15 ENCOUNTER — Other Ambulatory Visit: Payer: Medicare Other

## 2014-08-17 ENCOUNTER — Encounter: Payer: Medicare Other | Admitting: Internal Medicine

## 2014-11-09 ENCOUNTER — Other Ambulatory Visit: Payer: Medicare Other

## 2014-11-10 ENCOUNTER — Encounter: Payer: Self-pay | Admitting: Internal Medicine

## 2014-11-10 ENCOUNTER — Ambulatory Visit (INDEPENDENT_AMBULATORY_CARE_PROVIDER_SITE_OTHER): Payer: Medicare Other | Admitting: Internal Medicine

## 2014-11-10 VITALS — BP 150/73 | HR 65 | Temp 98.2°F | Resp 18 | Ht 63.0 in | Wt 149.2 lb

## 2014-11-10 DIAGNOSIS — F329 Major depressive disorder, single episode, unspecified: Secondary | ICD-10-CM

## 2014-11-10 DIAGNOSIS — K219 Gastro-esophageal reflux disease without esophagitis: Secondary | ICD-10-CM | POA: Diagnosis not present

## 2014-11-10 DIAGNOSIS — I639 Cerebral infarction, unspecified: Secondary | ICD-10-CM

## 2014-11-10 DIAGNOSIS — E785 Hyperlipidemia, unspecified: Secondary | ICD-10-CM

## 2014-11-10 DIAGNOSIS — F32A Depression, unspecified: Secondary | ICD-10-CM

## 2014-11-10 DIAGNOSIS — F339 Major depressive disorder, recurrent, unspecified: Secondary | ICD-10-CM | POA: Diagnosis not present

## 2014-11-10 DIAGNOSIS — M545 Low back pain, unspecified: Secondary | ICD-10-CM

## 2014-11-10 DIAGNOSIS — R739 Hyperglycemia, unspecified: Secondary | ICD-10-CM

## 2014-11-10 MED ORDER — LOSARTAN POTASSIUM 50 MG PO TABS: 50.0000 mg | ORAL_TABLET | Freq: Every day | ORAL | Status: DC

## 2014-11-10 MED ORDER — LOVASTATIN 10 MG PO TABS: 10.0000 mg | ORAL_TABLET | Freq: Every day | ORAL | Status: DC

## 2014-11-10 NOTE — Progress Notes (Signed)
Patient ID: Tamara [name], female   DOB: 18-May-1927, 81 y.o.   MRN: [id number]   Subjective:    Patient ID: Tamara [name], female    DOB: May 08, 1927, 81 y.o.   MRN: [id number]  HPI  Patient with past history of CVA, GERD, hypertension and hypercholesterolemia.  She comes in today to follow up on these issues.  She overall feels she is doing relatively well.  No cardiac symptoms with increased activity or exertion.  No sob.  No acid reflux.  Bowels doing well.  No abdominal pain or cramping.  Cannot afford crestor.  The generic is not any cheaper for her.  On no cholesterol medication now.  Some low back pain. - occasionally.  Notices when she works.  No radiation of pain.  Does not feel needs any further intervention at this time.     Past Medical History  Diagnosis Date  . Hypertension   . Hypercholesterolemia   . GERD (gastroesophageal reflux disease)     EGD - gastritis/mild duodenitis, previous positive H. pylori  . Tubular adenoma of colon   . Fibrocystic breast disease   . Degenerative joint disease   . Osteopenia   . History of depression   . History of skin cancer     facial  . Vitamin D deficiency   . Chicken pox   . Glaucoma   . Migraines   . Diverticulitis   . Anemia   . Helicobacter pylori ab+   . Colitis   . Internal hemorrhoids   . Arthritis   . IBS (irritable bowel syndrome)   . Stroke ([place])   . Heart murmur   . Diverticulosis    Past Surgical History  Procedure Laterality Date  . Breast biopsy Bilateral     x4  . Appendectomy    . Bilateral salpingoophorectomy  2004    complex ovarian cyst (left) - benign  . Ventral hernia repair  2005    Dr. Tamala Julian  . Abdominal hysterectomy  1973    secondary to bleeding  . Mandible fracture surgery     Family History  Problem Relation Age of Onset  . Esophageal cancer Mother   . Stomach cancer Mother   . Cirrhosis Brother   . Parkinson's disease Brother   . Ovarian cancer Sister   . Colon cancer Sister    half-sister  . Heart disease Father    Social History   Social History  . Marital Status: Widowed    Spouse Name: N/A  . Number of Children: 2  . Years of Education: N/A   Occupational History  . retired    Social History Main Topics  . Smoking status: Never Smoker   . Smokeless tobacco: Never Used  . Alcohol Use: No  . Drug Use: No  . Sexual Activity: Not Asked   Other Topics Concern  . None   Social History Narrative    Outpatient Encounter Prescriptions as of 11/10/2014  Medication Sig  . aspirin 81 MG tablet Take 81 mg by mouth daily.  . clopidogrel (PLAVIX) 75 MG tablet Take 1 tablet (75 mg total) by mouth daily.  [name] losartan (COZAAR) 50 MG tablet Take 1 tablet (50 mg total) by mouth daily.  [name] PARoxetine (PAXIL) 30 MG tablet Take 25 mg by mouth daily.   . temazepam (RESTORIL) 15 MG capsule Take 15 mg by mouth at bedtime.  . [DISCONTINUED] losartan (COZAAR) 50 MG tablet Take 1 tablet (50 mg total) by mouth daily.  . [  DISCONTINUED] rosuvastatin (CRESTOR) 20 MG tablet Take 1 tablet (20 mg total) by mouth daily.  [name] lovastatin (MEVACOR) 10 MG tablet Take 1 tablet (10 mg total) by mouth at bedtime.   No facility-administered encounter medications on file as of 11/10/2014.    Review of Systems  Constitutional: Negative for appetite change and unexpected weight change.  HENT: Negative for congestion and sinus pressure.   Respiratory: Negative for cough, chest tightness and shortness of breath.   Cardiovascular: Negative for chest pain, palpitations and leg swelling.  Gastrointestinal: Negative for nausea, vomiting, abdominal pain and diarrhea.  Genitourinary: Negative for dysuria and difficulty urinating.  Musculoskeletal: Positive for back pain. Negative for joint swelling.  Skin: Negative for color change and rash.  Neurological: Negative for dizziness, light-headedness and headaches.  Psychiatric/Behavioral: Negative for dysphoric mood and agitation.         Objective:     Blood pressure rechecked by me:  148/84  Physical Exam  Constitutional: She appears well-developed and well-nourished. No distress.  HENT:  Nose: Nose normal.  Mouth/Throat: Oropharynx is clear and moist.  Eyes: Conjunctivae are normal. Right eye exhibits no discharge. Left eye exhibits no discharge.  Neck: Neck supple. No thyromegaly present.  Cardiovascular: Normal rate and regular rhythm.   Pulmonary/Chest: Breath sounds normal. No respiratory distress. She has no wheezes.  Abdominal: Soft. Bowel sounds are normal. There is no tenderness.  Musculoskeletal: She exhibits no edema or tenderness.  Lymphadenopathy:    She has no cervical adenopathy.  Skin: No rash noted. No erythema.  Psychiatric: She has a normal mood and affect. Her behavior is normal.    BP 150/73 mmHg  Pulse 65  Temp(Src) 98.2 F (36.8 C) (Oral)  Resp 18  Ht $R'5\' 3"'xu$  (1.6 m)  Wt 149 lb 4 oz (67.699 kg)  BMI 26.44 kg/m2  SpO2 95%  LMP 01/17/1972 Wt Readings from Last 3 Encounters:  11/10/14 149 lb 4 oz (67.699 kg)  08/10/14 150 lb 6 oz (68.21 kg)  05/17/14 150 lb 12 oz (68.38 kg)     Lab Results  Component Value Date   WBC 5.4 04/08/2014   HGB 12.5 04/08/2014   HCT 37.8 04/08/2014   PLT 378.0 04/08/2014   GLUCOSE 97 08/08/2014   CHOL 211* 08/08/2014   TRIG 85.0 08/08/2014   HDL 66.20 08/08/2014   LDLDIRECT 177.7 01/19/2013   LDLCALC 128* 08/08/2014   ALT 23 08/08/2014   AST 32 08/08/2014   NA 139 08/08/2014   K 4.1 08/08/2014   CL 105 08/08/2014   CREATININE 0.72 08/08/2014   BUN 15 08/08/2014   CO2 27 08/08/2014   TSH 3.21 04/08/2014   HGBA1C 6.0 08/08/2014       Assessment & Plan:   Problem List Items Addressed This Visit    Back pain    Back pain intermittently as outlined.  No radiation of pain.  Tylenol prn.  Desires no further intervention.       CVA (cerebral vascular accident) ([place]) - Primary    Continue aspirin and plavix.  Continue risk factor  modification.  Follow.        Relevant Medications   lovastatin (MEVACOR) 10 MG tablet   losartan (COZAAR) 50 MG tablet   Depression    Doing well on paxil.        GERD (gastroesophageal reflux disease)    EGD 03/02/14 - mild erosive gastritis and stricture.  No upper GI symptoms.        Hyperglycemia  Low carb diet and exercise.  Follow met b and a1c.       Hyperlipidemia    Cannot take crestor for financial reasons.  The generic is still expensive.  Has had intolerance to multiple statins.  Will try lovastatin.  She has not tried this medication.        Relevant Medications   lovastatin (MEVACOR) 10 MG tablet   losartan (COZAAR) 50 MG tablet       Einar Pheasant, MD

## 2014-11-10 NOTE — Progress Notes (Signed)
Pre-visit discussion using our clinic review tool. No additional management support is needed unless otherwise documented below in the visit note.  

## 2014-11-12 ENCOUNTER — Encounter: Payer: Self-pay | Admitting: Internal Medicine

## 2014-11-12 NOTE — Assessment & Plan Note (Signed)
Cannot take crestor for financial reasons.  The generic is still expensive.  Has had intolerance to multiple statins.  Will try lovastatin.  She has not tried this medication.

## 2014-11-12 NOTE — Assessment & Plan Note (Signed)
Doing well on paxil.   

## 2014-11-12 NOTE — Assessment & Plan Note (Signed)
Back pain intermittently as outlined.  No radiation of pain.  Tylenol prn.  Desires no further intervention.

## 2014-11-12 NOTE — Assessment & Plan Note (Signed)
Low carb diet and exercise.  Follow met b and a1c.  

## 2014-11-12 NOTE — Assessment & Plan Note (Signed)
Continue aspirin and plavix.  Continue risk factor modification.  Follow.

## 2014-11-12 NOTE — Assessment & Plan Note (Signed)
EGD 03/02/14 - mild erosive gastritis and stricture.  No upper GI symptoms.

## 2014-12-26 ENCOUNTER — Other Ambulatory Visit (INDEPENDENT_AMBULATORY_CARE_PROVIDER_SITE_OTHER): Payer: Medicare Other

## 2014-12-26 DIAGNOSIS — R739 Hyperglycemia, unspecified: Secondary | ICD-10-CM | POA: Diagnosis not present

## 2014-12-26 DIAGNOSIS — I159 Secondary hypertension, unspecified: Secondary | ICD-10-CM

## 2014-12-26 DIAGNOSIS — E7800 Pure hypercholesterolemia, unspecified: Secondary | ICD-10-CM

## 2014-12-26 LAB — LIPID PANEL
Cholesterol: 260 mg/dL — ABNORMAL HIGH (ref 0–200)
HDL: 60.8 mg/dL (ref >39.00)
LDL Cholesterol: 181 mg/dL — ABNORMAL HIGH (ref 0–99)
NonHDL: 199.18
Total CHOL/HDL Ratio: 4
Triglycerides: 93 mg/dL (ref 0.0–149.0)
VLDL: 18.6 mg/dL (ref 0.0–40.0)

## 2014-12-26 LAB — BASIC METABOLIC PANEL
BUN: 12 mg/dL (ref 6–23)
CHLORIDE: 103 meq/L (ref 96–112)
CO2: 29 mEq/L (ref 19–32)
Calcium: 9.6 mg/dL (ref 8.4–10.5)
Creatinine, Ser: 0.71 mg/dL (ref 0.40–1.20)
GFR: 83.17 mL/min (ref >60.00)
GLUCOSE: 89 mg/dL (ref 70–99)
POTASSIUM: 4.7 meq/L (ref 3.5–5.1)
Sodium: 139 mEq/L (ref 135–145)

## 2014-12-26 LAB — HEPATIC FUNCTION PANEL
ALK PHOS: 71 U/L (ref 39–117)
ALT: 8 U/L (ref 0–35)
AST: 16 U/L (ref 0–37)
Albumin: 3.9 g/dL (ref 3.5–5.2)
BILIRUBIN DIRECT: 0.1 mg/dL (ref 0.0–0.3)
BILIRUBIN TOTAL: 0.5 mg/dL (ref 0.2–1.2)
Total Protein: 6.5 g/dL (ref 6.0–8.3)

## 2014-12-26 LAB — HEMOGLOBIN A1C: Hgb A1c MFr Bld: 5.9 % (ref 4.6–6.5)

## 2014-12-28 ENCOUNTER — Other Ambulatory Visit: Payer: Self-pay | Admitting: *Deleted

## 2014-12-28 MED ORDER — LOVASTATIN 20 MG PO TABS: 20.0000 mg | ORAL_TABLET | Freq: Every day | ORAL | Status: DC

## 2015-02-13 ENCOUNTER — Ambulatory Visit (INDEPENDENT_AMBULATORY_CARE_PROVIDER_SITE_OTHER)
Admission: RE | Admit: 2015-02-13 | Discharge: 2015-02-13 | Disposition: A | Discharge status: Home / Self Care | Payer: Medicare Other | Source: Ambulatory Visit | Attending: Internal Medicine | Admitting: Internal Medicine

## 2015-02-13 ENCOUNTER — Ambulatory Visit (INDEPENDENT_AMBULATORY_CARE_PROVIDER_SITE_OTHER): Payer: Medicare Other | Admitting: Internal Medicine

## 2015-02-13 ENCOUNTER — Encounter: Payer: Self-pay | Admitting: Internal Medicine

## 2015-02-13 VITALS — BP 136/60 | HR 69 | Temp 97.9°F | Resp 18 | Ht 63.0 in | Wt 149.0 lb

## 2015-02-13 DIAGNOSIS — M25561 Pain in right knee: Secondary | ICD-10-CM | POA: Insufficient documentation

## 2015-02-13 DIAGNOSIS — I159 Secondary hypertension, unspecified: Secondary | ICD-10-CM | POA: Diagnosis not present

## 2015-02-13 DIAGNOSIS — F329 Major depressive disorder, single episode, unspecified: Secondary | ICD-10-CM

## 2015-02-13 DIAGNOSIS — R739 Hyperglycemia, unspecified: Secondary | ICD-10-CM

## 2015-02-13 DIAGNOSIS — R0989 Other specified symptoms and signs involving the circulatory and respiratory systems: Secondary | ICD-10-CM

## 2015-02-13 DIAGNOSIS — I6523 Occlusion and stenosis of bilateral carotid arteries: Secondary | ICD-10-CM

## 2015-02-13 DIAGNOSIS — Z1239 Encounter for other screening for malignant neoplasm of breast: Secondary | ICD-10-CM | POA: Diagnosis not present

## 2015-02-13 DIAGNOSIS — I639 Cerebral infarction, unspecified: Secondary | ICD-10-CM | POA: Diagnosis not present

## 2015-02-13 DIAGNOSIS — Z1231 Encounter for screening mammogram for malignant neoplasm of breast: Secondary | ICD-10-CM | POA: Diagnosis not present

## 2015-02-13 DIAGNOSIS — E785 Hyperlipidemia, unspecified: Secondary | ICD-10-CM

## 2015-02-13 DIAGNOSIS — K219 Gastro-esophageal reflux disease without esophagitis: Secondary | ICD-10-CM

## 2015-02-13 DIAGNOSIS — F32A Depression, unspecified: Secondary | ICD-10-CM

## 2015-02-13 MED ORDER — LOVASTATIN 20 MG PO TABS: 20.0000 mg | ORAL_TABLET | Freq: Every day | ORAL | Status: DC

## 2015-02-13 MED ORDER — PAROXETINE HCL 30 MG PO TABS: 25.0000 mg | ORAL_TABLET | Freq: Every day | ORAL | Status: DC

## 2015-02-13 MED ORDER — TEMAZEPAM 15 MG PO CAPS: 15.0000 mg | ORAL_CAPSULE | Freq: Every day | ORAL | Status: DC

## 2015-02-13 NOTE — Progress Notes (Signed)
Pre-visit discussion using our clinic review tool. No additional management support is needed unless otherwise documented below in the visit note.  

## 2015-02-13 NOTE — Progress Notes (Signed)
Patient ID: Tamara [name], female   DOB: 1927-05-17, 82 y.o.   MRN: [id number]   Subjective:    Patient ID: Tamara [name], female    DOB: [birth year], 82 y.o.   MRN: [id number]  HPI  Patient with past history of CVA, carotid stenosis, hypertension, GERD and hypercholesterolemia.  She comes in today to follow up on these issues.  She states overall she is doing well.  Feels good.  She did fall a few months ago.  Tripped over a cord as she was blowing leaves.  [place] forward on both knees.  She has continued to have pain in the right knee - medial aspect.  Will radiate from the knee.  No increased swelling or redness.  No significant pain with ambulating.  She does report pain with rotation of her knee.  No chest pain or tightness.  Back is doing better.  No sob.  No abdominal pain or cramping.  Bowels doing well.     Past Medical History  Diagnosis Date  . Hypertension   . Hypercholesterolemia   . GERD (gastroesophageal reflux disease)     EGD - gastritis/mild duodenitis, previous positive H. pylori  . Tubular adenoma of colon   . Fibrocystic breast disease   . Degenerative joint disease   . Osteopenia   . History of depression   . History of skin cancer     facial  . Vitamin D deficiency   . Chicken pox   . Glaucoma   . Migraines   . Diverticulitis   . Anemia   . Helicobacter pylori ab+   . Colitis   . Internal hemorrhoids   . Arthritis   . IBS (irritable bowel syndrome)   . Stroke ([place])   . Heart murmur   . Diverticulosis    Past Surgical History  Procedure Laterality Date  . Breast biopsy Bilateral     x4  . Appendectomy    . Bilateral salpingoophorectomy  2004    complex ovarian cyst (left) - benign  . Ventral hernia repair  2005    Dr. Tamala Julian  . Abdominal hysterectomy  1973    secondary to bleeding  . Mandible fracture surgery     Family History  Problem Relation Age of Onset  . Esophageal cancer Mother   . Stomach cancer Mother   . Cirrhosis Brother   .  Parkinson's disease Brother   . Ovarian cancer Sister   . Colon cancer Sister     half-sister  . Heart disease Father    Social History   Social History  . Marital Status: Widowed    Spouse Name: N/A  . Number of Children: 2  . Years of Education: N/A   Occupational History  . retired    Social History Main Topics  . Smoking status: Never Smoker   . Smokeless tobacco: Never Used  . Alcohol Use: No  . Drug Use: No  . Sexual Activity: Not Asked   Other Topics Concern  . None   Social History Narrative    Outpatient Encounter Prescriptions as of 02/13/2015  Medication Sig  . aspirin 81 MG tablet Take 81 mg by mouth daily.  . clopidogrel (PLAVIX) 75 MG tablet Take 1 tablet (75 mg total) by mouth daily.  [name] losartan (COZAAR) 50 MG tablet Take 1 tablet (50 mg total) by mouth daily.  [name] lovastatin (MEVACOR) 20 MG tablet Take 1 tablet (20 mg total) by mouth at bedtime.  [name] PARoxetine (PAXIL) 30  MG tablet Take 1 tablet (30 mg total) by mouth daily.  . temazepam (RESTORIL) 15 MG capsule Take 1 capsule (15 mg total) by mouth at bedtime.  . [DISCONTINUED] lovastatin (MEVACOR) 20 MG tablet Take 1 tablet (20 mg total) by mouth at bedtime.  . [DISCONTINUED] PARoxetine (PAXIL) 30 MG tablet Take 25 mg by mouth daily.   . [DISCONTINUED] temazepam (RESTORIL) 15 MG capsule Take 15 mg by mouth at bedtime.   No facility-administered encounter medications on file as of 02/13/2015.    Review of Systems  Constitutional: Negative for appetite change and unexpected weight change.  HENT: Negative for congestion and sinus pressure.   Eyes: Negative for discharge and redness.  Respiratory: Negative for cough, chest tightness and shortness of breath.   Cardiovascular: Negative for chest pain, palpitations and leg swelling.  Gastrointestinal: Negative for nausea, vomiting, abdominal pain and diarrhea.  Genitourinary: Negative for dysuria and difficulty urinating.  Musculoskeletal: Negative for back  pain.       Right knee pain - persistent.    Skin: Negative for color change and rash.  Neurological: Negative for dizziness, light-headedness and headaches.  Psychiatric/Behavioral: Negative for dysphoric mood and agitation.       Objective:     Blood pressure rechecked by me:  134/68  Physical Exam  Constitutional: She appears well-developed and well-nourished. No distress.  HENT:  Nose: Nose normal.  Mouth/Throat: Oropharynx is clear and moist.  Eyes: Conjunctivae are normal. Right eye exhibits no discharge. Left eye exhibits no discharge.  Neck: Neck supple. No thyromegaly present.  Cardiovascular: Normal rate and regular rhythm.   Pulmonary/Chest: Breath sounds normal. No respiratory distress. She has no wheezes.  Abdominal: Soft. Bowel sounds are normal. There is no tenderness.  Musculoskeletal: She exhibits no edema or tenderness.  Increased pain to palpation over the medial aspect of the right knee.  Increased pain with rotation at the right knee.  Especially with inversion and eversion of lower leg.  No significant pain with walking.    Lymphadenopathy:    She has no cervical adenopathy.  Skin: No rash noted. No erythema.  Psychiatric: She has a normal mood and affect. Thought content normal.    BP 136/60 mmHg  Pulse 69  Temp(Src) 97.9 F (36.6 C) (Oral)  Resp 18  Ht '5\' 3"'$  (1.6 m)  Wt 149 lb (67.586 kg)  BMI 26.40 kg/m2  SpO2 95%  LMP 01/17/1972 Wt Readings from Last 3 Encounters:  02/13/15 149 lb (67.586 kg)  11/10/14 149 lb 4 oz (67.699 kg)  08/10/14 150 lb 6 oz (68.21 kg)     Lab Results  Component Value Date   WBC 5.4 04/08/2014   HGB 12.5 04/08/2014   HCT 37.8 04/08/2014   PLT 378.0 04/08/2014   GLUCOSE 89 12/26/2014   CHOL 260* 12/26/2014   TRIG 93.0 12/26/2014   HDL 60.80 12/26/2014   LDLDIRECT 177.7 01/19/2013   LDLCALC 181* 12/26/2014   ALT 8 12/26/2014   AST 16 12/26/2014   NA 139 12/26/2014   K 4.7 12/26/2014   CL 103 12/26/2014    CREATININE 0.71 12/26/2014   BUN 12 12/26/2014   CO2 29 12/26/2014   TSH 3.21 04/08/2014   HGBA1C 5.9 12/26/2014       Assessment & Plan:   Problem List Items Addressed This Visit    Carotid stenosis    Continue antiplatelet therapy.        Relevant Medications   lovastatin (MEVACOR) 20 MG tablet  CVA (cerebral vascular accident) ([place])    Continue aspirin and plavix.  Continue risk factor modification.        Relevant Medications   lovastatin (MEVACOR) 20 MG tablet   Depression    Doing well on paxil.        Relevant Medications   PARoxetine (PAXIL) 30 MG tablet   Other Relevant Orders   TSH   GERD (gastroesophageal reflux disease)    No upper GI symptoms.  EGD 03/02/14 - mild erosive gastritis and stricture.       Hyperglycemia    Low carb diet and exercise.  Follow met b and a1c.       Relevant Orders   Basic metabolic panel   Hemoglobin A1c   Hyperlipidemia    Low cholesterol diet and exercise.  On lovastatin.  Follow lipid panel and liver function tests.        Relevant Medications   lovastatin (MEVACOR) 20 MG tablet   Other Relevant Orders   Lipid panel   Hepatic function panel   Hypertension - Primary    Blood pressure under good control.  Continue same medication regimen.  Follow pressures.  Follow metabolic panel.        Relevant Medications   lovastatin (MEVACOR) 20 MG tablet   Other Relevant Orders   CBC with Differential/Platelet   Left carotid bruit    Had carotid ultrasound in hospital.  No hemodynamically significant stenosis.  On aspirin and plavix.  Follow.        Right knee pain    Persistent s/p fall.  Check xray.        Relevant Orders   DG Knee 1-2 Views Right (Completed)    Other Visit Diagnoses    Screening breast examination        Relevant Orders    MM DIGITAL SCREENING BILATERAL        Einar Pheasant, MD

## 2015-02-15 ENCOUNTER — Telehealth: Payer: Self-pay | Admitting: Internal Medicine

## 2015-02-15 NOTE — Telephone Encounter (Signed)
Notified pt's daughter of Tamara [name]'s xray results, her daughter verbalized understanding and stated that she would call back if her mom wanted to got to ortho./tvw

## 2015-02-15 NOTE — Telephone Encounter (Signed)
Pt daughter called to get xray results done on 02/13/2015 at [place] [facility]. Call [place] daughter @ [phone]. Thank You!

## 2015-02-19 ENCOUNTER — Encounter: Payer: Self-pay | Admitting: Internal Medicine

## 2015-02-19 NOTE — Assessment & Plan Note (Signed)
Doing well on paxil.   

## 2015-02-19 NOTE — Assessment & Plan Note (Signed)
Continue antiplatelet therapy. °  °

## 2015-02-19 NOTE — Assessment & Plan Note (Signed)
Low carb diet and exercise.  Follow met b and a1c.  

## 2015-02-19 NOTE — Assessment & Plan Note (Signed)
Persistent s/p fall.  Check xray.

## 2015-02-19 NOTE — Assessment & Plan Note (Addendum)
Continue aspirin and plavix.  Continue risk factor modification.

## 2015-02-19 NOTE — Assessment & Plan Note (Signed)
Low cholesterol diet and exercise.  On lovastatin.  Follow lipid panel and liver function tests.   

## 2015-02-19 NOTE — Assessment & Plan Note (Signed)
Had carotid ultrasound in hospital.  No hemodynamically significant stenosis.  On aspirin and plavix.  Follow.

## 2015-02-19 NOTE — Assessment & Plan Note (Signed)
No upper GI symptoms.  EGD 03/02/14 - mild erosive gastritis and stricture.

## 2015-02-19 NOTE — Assessment & Plan Note (Signed)
Blood pressure under good control.  Continue same medication regimen.  Follow pressures.  Follow metabolic panel.   

## 2015-02-22 ENCOUNTER — Telehealth: Payer: Self-pay | Admitting: *Deleted

## 2015-02-22 NOTE — Telephone Encounter (Signed)
Patient has requested a medication refill for temazepam  Pharmacy [facility] on [address]

## 2015-02-23 NOTE — Telephone Encounter (Signed)
Pt was confused about where her Rx was and I stated that it would have been with her d/c papers from that visit. Pt stated that she was able to find it and is taking to the pharmacy.

## 2015-02-23 NOTE — Telephone Encounter (Signed)
Pt was seen in office on 02/13/15. It shows that Rx was printed during her visit. Pt should have a hard copy of this medication.

## 2015-02-23 NOTE — Telephone Encounter (Signed)
Has this RX been faxed , saw it was pending 02/13/15. Please advise

## 2015-02-24 ENCOUNTER — Ambulatory Visit
Admission: RE | Admit: 2015-02-24 | Discharge: 2015-02-24 | Disposition: A | Discharge status: Home / Self Care | Payer: Medicare Other | Source: Ambulatory Visit | Attending: Internal Medicine | Admitting: Internal Medicine

## 2015-02-24 ENCOUNTER — Other Ambulatory Visit: Payer: Self-pay | Admitting: Internal Medicine

## 2015-02-24 DIAGNOSIS — Z1239 Encounter for other screening for malignant neoplasm of breast: Secondary | ICD-10-CM

## 2015-02-24 DIAGNOSIS — Z1231 Encounter for screening mammogram for malignant neoplasm of breast: Secondary | ICD-10-CM

## 2015-04-06 ENCOUNTER — Encounter: Payer: Self-pay | Admitting: Nurse Practitioner

## 2015-04-06 ENCOUNTER — Ambulatory Visit (INDEPENDENT_AMBULATORY_CARE_PROVIDER_SITE_OTHER): Payer: Medicare Other | Admitting: Nurse Practitioner

## 2015-04-06 VITALS — BP 108/64 | HR 76 | Temp 97.8°F | Resp 14 | Ht 63.0 in | Wt 148.4 lb

## 2015-04-06 DIAGNOSIS — R197 Diarrhea, unspecified: Secondary | ICD-10-CM | POA: Diagnosis not present

## 2015-04-06 DIAGNOSIS — I6523 Occlusion and stenosis of bilateral carotid arteries: Secondary | ICD-10-CM | POA: Diagnosis not present

## 2015-04-06 NOTE — Patient Instructions (Addendum)
Continue imodium 1 at a time   [employer]  Bananas, rice, applesauce, toast soups ect...  Bring back your sample tomorrow.

## 2015-04-06 NOTE — Progress Notes (Signed)
Patient ID: Tamara [name], female    DOB: 1928/01/08  Age: 82 y.o. MRN: [id number]  CC: Diarrhea   HPI Tamara [name] presents for diarrhea x 7 days.   1) Last 2 months switched from cream to milk in coffee (only food change known). Felt better this weekend, but was worsening over the beginning of this week.  Yellow in color, gold  Odor- foul  Frequency in the last 24 hours- 10 times  Amount- large amounts Denies food, mucous, or blood in stool   Treatment to date:  Imodium- yesterday 3 o'clock took 2- helpful  No recent abx History Tamara [name] has a past medical history of Hypertension; Hypercholesterolemia; GERD (gastroesophageal reflux disease); Tubular adenoma of colon; Fibrocystic breast disease; Degenerative joint disease; Osteopenia; History of depression; History of skin cancer; Vitamin D deficiency; Chicken pox; Glaucoma; Migraines; Diverticulitis; Anemia; Helicobacter pylori ab+; Colitis; Internal hemorrhoids; Arthritis; IBS (irritable bowel syndrome); Stroke [facility]); Heart murmur; and Diverticulosis.   She has past surgical history that includes Appendectomy; Bilateral salpingoophorectomy (2004); Ventral hernia repair (2005); Abdominal hysterectomy (1973); Mandible fracture surgery; and Breast biopsy (Bilateral).   Her family history includes Cirrhosis in her brother; Colon cancer in her sister; Esophageal cancer in her mother; Heart disease in her father; Ovarian cancer in her sister; Parkinson's disease in her brother; Stomach cancer in her mother.She reports that she has never smoked. She has never used smokeless tobacco. She reports that she does not drink alcohol or use illicit drugs.  Outpatient Prescriptions Prior to Visit  Medication Sig Dispense Refill  . aspirin 81 MG tablet Take 81 mg by mouth daily.    . clopidogrel (PLAVIX) 75 MG tablet Take 1 tablet (75 mg total) by mouth daily. 90 tablet 3  . losartan (COZAAR) 50 MG tablet Take 1 tablet (50 mg total) by mouth daily. 90  tablet 1  . lovastatin (MEVACOR) 20 MG tablet Take 1 tablet (20 mg total) by mouth at bedtime. 30 tablet 2  . PARoxetine (PAXIL) 30 MG tablet Take 1 tablet (30 mg total) by mouth daily. 30 tablet 2  . temazepam (RESTORIL) 15 MG capsule Take 1 capsule (15 mg total) by mouth at bedtime. 30 capsule 1   No facility-administered medications prior to visit.    ROS Review of Systems  Constitutional: Negative for fever, chills, diaphoresis and fatigue.  Respiratory: Negative for chest tightness, shortness of breath and wheezing.   Cardiovascular: Negative for chest pain, palpitations and leg swelling.  Gastrointestinal: Positive for diarrhea. Negative for nausea, vomiting and rectal pain.  Skin: Negative for rash.  Neurological: Negative for dizziness, numbness and headaches.    Objective:  BP 108/64 mmHg  Pulse 76  Temp(Src) 97.8 F (36.6 C) (Oral)  Resp 14  Ht 5\' 3"  (1.6 m)  Wt 148 lb 6.4 oz (67.314 kg)  BMI 26.29 kg/m2  SpO2 96%  LMP 01/17/1972  Physical Exam  Constitutional: She is oriented to person, place, and time. She appears well-developed and well-nourished. No distress.  HENT:  Head: Normocephalic and atraumatic.  Right Ear: External ear normal.  Left Ear: External ear normal.  Eyes: Right eye exhibits no discharge. Left eye exhibits no discharge. No scleral icterus.  Cardiovascular: Normal rate and regular rhythm.   Pulmonary/Chest: Effort normal and breath sounds normal. No respiratory distress. She has no wheezes. She has no rales. She exhibits no tenderness.  Abdominal: Soft. Bowel sounds are normal. She exhibits no distension and no mass. There is no tenderness. There is  no rebound and no guarding.  Neurological: She is alert and oriented to person, place, and time.  Skin: Skin is warm and dry. No rash noted. She is not diaphoretic.  Psychiatric: She has a normal mood and affect. Her behavior is normal. Judgment and thought content normal.   Assessment & Plan:    Tamara [name] was seen today for diarrhea.  Diagnoses and all orders for this visit:  Diarrhea, unspecified type -     Stool Culture   I am having [name] maintain her aspirin, clopidogrel, losartan, lovastatin, PARoxetine, and temazepam.  No orders of the defined types were placed in this encounter.     Follow-up: Return if symptoms worsen or fail to improve.

## 2015-04-10 ENCOUNTER — Other Ambulatory Visit: Payer: Self-pay | Admitting: *Deleted

## 2015-04-10 DIAGNOSIS — R197 Diarrhea, unspecified: Secondary | ICD-10-CM | POA: Diagnosis not present

## 2015-04-14 LAB — STOOL CULTURE

## 2015-04-15 DIAGNOSIS — R197 Diarrhea, unspecified: Secondary | ICD-10-CM | POA: Insufficient documentation

## 2015-04-15 NOTE — Assessment & Plan Note (Signed)
New onset Unknown etiology likely not infectious Patient is willing to give stool culture advised that the results will not be back for some time Advised one Imodium at a time and brat diet Also discussed using probiotics Follow closely with PCP

## 2015-04-21 ENCOUNTER — Other Ambulatory Visit (INDEPENDENT_AMBULATORY_CARE_PROVIDER_SITE_OTHER): Payer: Medicare Other

## 2015-04-21 DIAGNOSIS — F329 Major depressive disorder, single episode, unspecified: Secondary | ICD-10-CM | POA: Diagnosis not present

## 2015-04-21 DIAGNOSIS — E785 Hyperlipidemia, unspecified: Secondary | ICD-10-CM | POA: Diagnosis not present

## 2015-04-21 DIAGNOSIS — I159 Secondary hypertension, unspecified: Secondary | ICD-10-CM | POA: Diagnosis not present

## 2015-04-21 DIAGNOSIS — R739 Hyperglycemia, unspecified: Secondary | ICD-10-CM | POA: Diagnosis not present

## 2015-04-21 DIAGNOSIS — F32A Depression, unspecified: Secondary | ICD-10-CM

## 2015-04-21 LAB — CBC WITH DIFFERENTIAL/PLATELET
BASOS ABS: 0 10*3/uL (ref 0.0–0.1)
Basophils Relative: 0.6 % (ref 0.0–3.0)
EOS ABS: 0.1 10*3/uL (ref 0.0–0.7)
Eosinophils Relative: 1.2 % (ref 0.0–5.0)
HCT: 38 % (ref 36.0–46.0)
HEMOGLOBIN: 12.5 g/dL (ref 12.0–15.0)
LYMPHS ABS: 1.5 10*3/uL (ref 0.7–4.0)
Lymphocytes Relative: 29.8 % (ref 12.0–46.0)
MCHC: 33 g/dL (ref 30.0–36.0)
MCV: 84.9 fl (ref 78.0–100.0)
MONO ABS: 0.3 10*3/uL (ref 0.1–1.0)
Monocytes Relative: 5.7 % (ref 3.0–12.0)
NEUTROS PCT: 62.7 % (ref 43.0–77.0)
Neutro Abs: 3.2 10*3/uL (ref 1.4–7.7)
Platelets: 431 10*3/uL — ABNORMAL HIGH (ref 150.0–400.0)
RBC: 4.48 Mil/uL (ref 3.87–5.11)
RDW: 14.5 % (ref 11.5–15.5)
WBC: 5.2 10*3/uL (ref 4.0–10.5)

## 2015-04-21 LAB — BASIC METABOLIC PANEL
BUN: 14 mg/dL (ref 6–23)
CHLORIDE: 103 meq/L (ref 96–112)
CO2: 30 mEq/L (ref 19–32)
Calcium: 9.6 mg/dL (ref 8.4–10.5)
Creatinine, Ser: 0.79 mg/dL (ref 0.40–1.20)
GFR: 73.47 mL/min (ref >60.00)
GLUCOSE: 102 mg/dL — AB (ref 70–99)
POTASSIUM: 4.3 meq/L (ref 3.5–5.1)
Sodium: 139 mEq/L (ref 135–145)

## 2015-04-21 LAB — LIPID PANEL
CHOL/HDL RATIO: 4
CHOLESTEROL: 258 mg/dL — AB (ref 0–200)
HDL: 63.2 mg/dL (ref >39.00)
LDL CALC: 173 mg/dL — AB (ref 0–99)
NonHDL: 194.57
Triglycerides: 108 mg/dL (ref 0.0–149.0)
VLDL: 21.6 mg/dL (ref 0.0–40.0)

## 2015-04-21 LAB — HEPATIC FUNCTION PANEL
ALT: 8 U/L (ref 0–35)
AST: 15 U/L (ref 0–37)
Albumin: 4.1 g/dL (ref 3.5–5.2)
Alkaline Phosphatase: 66 U/L (ref 39–117)
BILIRUBIN DIRECT: 0.1 mg/dL (ref 0.0–0.3)
BILIRUBIN TOTAL: 0.4 mg/dL (ref 0.2–1.2)
Total Protein: 7.3 g/dL (ref 6.0–8.3)

## 2015-04-21 LAB — HEMOGLOBIN A1C: Hgb A1c MFr Bld: 6.1 % (ref 4.6–6.5)

## 2015-04-21 LAB — TSH: TSH: 3.69 u[IU]/mL (ref 0.35–4.50)

## 2015-04-25 ENCOUNTER — Encounter: Payer: Self-pay | Admitting: Internal Medicine

## 2015-04-25 ENCOUNTER — Ambulatory Visit (INDEPENDENT_AMBULATORY_CARE_PROVIDER_SITE_OTHER): Payer: Medicare Other | Admitting: Internal Medicine

## 2015-04-25 VITALS — BP 120/64 | HR 74 | Temp 98.0°F | Resp 18 | Ht 63.0 in | Wt 145.2 lb

## 2015-04-25 DIAGNOSIS — I639 Cerebral infarction, unspecified: Secondary | ICD-10-CM

## 2015-04-25 DIAGNOSIS — F329 Major depressive disorder, single episode, unspecified: Secondary | ICD-10-CM

## 2015-04-25 DIAGNOSIS — E785 Hyperlipidemia, unspecified: Secondary | ICD-10-CM

## 2015-04-25 DIAGNOSIS — I159 Secondary hypertension, unspecified: Secondary | ICD-10-CM

## 2015-04-25 DIAGNOSIS — R197 Diarrhea, unspecified: Secondary | ICD-10-CM

## 2015-04-25 DIAGNOSIS — F32A Depression, unspecified: Secondary | ICD-10-CM

## 2015-04-25 DIAGNOSIS — I6523 Occlusion and stenosis of bilateral carotid arteries: Secondary | ICD-10-CM | POA: Diagnosis not present

## 2015-04-25 DIAGNOSIS — R110 Nausea: Secondary | ICD-10-CM

## 2015-04-25 DIAGNOSIS — R739 Hyperglycemia, unspecified: Secondary | ICD-10-CM

## 2015-04-25 DIAGNOSIS — R1084 Generalized abdominal pain: Secondary | ICD-10-CM

## 2015-04-25 DIAGNOSIS — R109 Unspecified abdominal pain: Secondary | ICD-10-CM | POA: Insufficient documentation

## 2015-04-25 MED ORDER — TEMAZEPAM 15 MG PO CAPS: 15.0000 mg | ORAL_CAPSULE | Freq: Every day | ORAL | Status: DC

## 2015-04-25 MED ORDER — LOVASTATIN 40 MG PO TABS: 40.0000 mg | ORAL_TABLET | Freq: Every day | ORAL | Status: DC

## 2015-04-25 MED ORDER — LOSARTAN POTASSIUM 50 MG PO TABS: 50.0000 mg | ORAL_TABLET | Freq: Every day | ORAL | Status: DC

## 2015-04-25 NOTE — Progress Notes (Signed)
Patient ID: Tamara [name], female   DOB: May 22, 1927, 82 y.o.   MRN: [id number]   Subjective:    Patient ID: Tamara [name], female    DOB: 1927/05/25, 82 y.o.   MRN: [id number]  HPI  Patient here for a scheduled follow up.  She is accompanied by her daughter.  History obtained from both of them.  She was seen on 04/06/15 with diarrhea.  See that note for details.  She continues to have loose stool.  Is some better now.  Previously took immodium.  Became constipated temporarily.  Off immodium now.  No vomiting.  Some nausea.  She is eating.  Reports some increased abdominal swelling and gas.  Some lower abdominal discomfort - right side.  Breathing stable.  No chest pain.  No blood in the stool.     Past Medical History  Diagnosis Date  . Hypertension   . Hypercholesterolemia   . GERD (gastroesophageal reflux disease)     EGD - gastritis/mild duodenitis, previous positive H. pylori  . Tubular adenoma of colon   . Fibrocystic breast disease   . Degenerative joint disease   . Osteopenia   . History of depression   . History of skin cancer     facial  . Vitamin D deficiency   . Chicken pox   . Glaucoma   . Migraines   . Diverticulitis   . Anemia   . Helicobacter pylori ab+   . Colitis   . Internal hemorrhoids   . Arthritis   . IBS (irritable bowel syndrome)   . Stroke ([place])   . Heart murmur   . Diverticulosis    Past Surgical History  Procedure Laterality Date  . Appendectomy    . Bilateral salpingoophorectomy  2004    complex ovarian cyst (left) - benign  . Ventral hernia repair  2005    Dr. Tamala Julian  . Abdominal hysterectomy  1973    secondary to bleeding  . Mandible fracture surgery    . Breast biopsy Bilateral     x4   Family History  Problem Relation Age of Onset  . Esophageal cancer Mother   . Stomach cancer Mother   . Cirrhosis Brother   . Parkinson's disease Brother   . Ovarian cancer Sister   . Colon cancer Sister     half-sister  . Heart disease Father      Social History   Social History  . Marital Status: Widowed    Spouse Name: N/A  . Number of Children: 2  . Years of Education: N/A   Occupational History  . retired    Social History Main Topics  . Smoking status: Never Smoker   . Smokeless tobacco: Never Used  . Alcohol Use: No  . Drug Use: No  . Sexual Activity: Not Asked   Other Topics Concern  . None   Social History Narrative    Outpatient Encounter Prescriptions as of 04/25/2015  Medication Sig  . aspirin 81 MG tablet Take 81 mg by mouth daily.  . clopidogrel (PLAVIX) 75 MG tablet Take 1 tablet (75 mg total) by mouth daily.  [name] losartan (COZAAR) 50 MG tablet Take 1 tablet (50 mg total) by mouth daily.  [name] PARoxetine (PAXIL) 30 MG tablet Take 1 tablet (30 mg total) by mouth daily.  . temazepam (RESTORIL) 15 MG capsule Take 1 capsule (15 mg total) by mouth at bedtime.  . [DISCONTINUED] losartan (COZAAR) 50 MG tablet Take 1 tablet (50 mg total)  by mouth daily.  . [DISCONTINUED] lovastatin (MEVACOR) 20 MG tablet Take 1 tablet (20 mg total) by mouth at bedtime.  . [DISCONTINUED] temazepam (RESTORIL) 15 MG capsule Take 1 capsule (15 mg total) by mouth at bedtime.  . lovastatin (MEVACOR) 40 MG tablet Take 1 tablet (40 mg total) by mouth at bedtime.   No facility-administered encounter medications on file as of 04/25/2015.    Review of Systems  Constitutional: Negative for fever.       Eating and drinking.   HENT: Negative for congestion and sinus pressure.   Respiratory: Negative for cough, chest tightness and shortness of breath.   Cardiovascular: Negative for chest pain, palpitations and leg swelling.  Gastrointestinal: Positive for nausea, abdominal pain (right side. ) and diarrhea. Negative for vomiting.  Genitourinary: Negative for dysuria and difficulty urinating.  Musculoskeletal: Negative for myalgias and joint swelling.  Skin: Negative for color change and rash.  Neurological: Negative for dizziness,  light-headedness and headaches.  Psychiatric/Behavioral: Negative for dysphoric mood and agitation.       Objective:    Physical Exam  Constitutional: She appears well-developed and well-nourished. No distress.  HENT:  Nose: Nose normal.  Mouth/Throat: Oropharynx is clear and moist.  Neck: Neck supple. No thyromegaly present.  Cardiovascular: Normal rate and regular rhythm.   Pulmonary/Chest: Breath sounds normal. No respiratory distress. She has no wheezes.  Abdominal: Soft. Bowel sounds are normal.  Right side tenderness to palpation.    Musculoskeletal: She exhibits no edema or tenderness.  Lymphadenopathy:    She has no cervical adenopathy.  Skin: No rash noted. No erythema.  Psychiatric: She has a normal mood and affect. Her behavior is normal.    BP 120/64 mmHg  Pulse 74  Temp(Src) 98 F (36.7 C) (Oral)  Resp 18  Ht '5\' 3"'$  (1.6 m)  Wt 145 lb 4 oz (65.885 kg)  BMI 25.74 kg/m2  SpO2 96%  LMP 01/17/1972 Wt Readings from Last 3 Encounters:  04/27/15 145 lb (65.772 kg)  04/25/15 145 lb 4 oz (65.885 kg)  04/06/15 148 lb 6.4 oz (67.314 kg)     Lab Results  Component Value Date   WBC 9.4 04/27/2015   HGB 11.9* 04/27/2015   HCT 35.5 04/27/2015   PLT 334 04/27/2015   GLUCOSE 96 04/27/2015   CHOL 258* 04/21/2015   TRIG 108.0 04/21/2015   HDL 63.20 04/21/2015   LDLDIRECT 177.7 01/19/2013   LDLCALC 173* 04/21/2015   ALT 9* 04/27/2015   AST 19 04/27/2015   NA 139 04/27/2015   K 4.1 04/27/2015   CL 107 04/27/2015   CREATININE 0.67 04/27/2015   BUN 15 04/27/2015   CO2 24 04/27/2015   TSH 3.69 04/21/2015   HGBA1C 6.1 04/21/2015    Mm Screening Breast Tomo Bilateral  02/28/2015  CLINICAL DATA:  Screening. EXAM: DIGITAL SCREENING BILATERAL MAMMOGRAM WITH CAD COMPARISON:  Previous exam(s). ACR Breast Density Category b: There are scattered areas of fibroglandular density. FINDINGS: There are no findings suspicious for malignancy. Images were processed with CAD.  IMPRESSION: No mammographic evidence of malignancy. A result letter of this screening mammogram will be mailed directly to the patient. RECOMMENDATION: Screening mammogram in one year. (Code:SM-B-01Y) BI-RADS CATEGORY  1: Negative. Electronically Signed   By: Skipper Cliche M.D.   On: 02/24/2015 16:06       Assessment & Plan:   Problem List Items Addressed This Visit    Abdominal pain    Right side abdominal pain as outlined.  Probiotic.  Check CT.  Further w/up pending results.        Relevant Orders   CT Abdomen Pelvis W Contrast (Completed)   CVA (cerebral vascular accident) ([place])    On aspirin and plavix.  No reoccurring symptoms or problems.  Follow.        Relevant Medications   losartan (COZAAR) 50 MG tablet   lovastatin (MEVACOR) 40 MG tablet   Depression    On paxil.  Stable.        Diarrhea    Persistent.  Avoid immodium.  Continue probiotic.  Is some better.  With her history and with the persistent right side pain, will obtain CT scan abdomen and pelvis.  Further w/up pending results.  May need referral to GI.       Relevant Orders   CT Abdomen Pelvis W Contrast (Completed)   Hyperglycemia    Low carb diet and exercise.  Follow met b and a1c.        Hyperlipidemia    Discussed cholesterol results.  Discussed importance of taking her medication on a regular basis.  Increase lovastatin to '40mg'$  q day.  Follow lipid panel and liver function tests.        Relevant Medications   losartan (COZAAR) 50 MG tablet   lovastatin (MEVACOR) 40 MG tablet   Hypertension    Blood pressure as outlined.  Continue same medication regimen.  Follow pressures.  Follow metabolic panel.       Relevant Medications   losartan (COZAAR) 50 MG tablet   lovastatin (MEVACOR) 40 MG tablet    Other Visit Diagnoses    Nausea without vomiting    -  Primary    Relevant Orders    CT Abdomen Pelvis W Contrast (Completed)        Einar Pheasant, MD

## 2015-04-25 NOTE — Progress Notes (Signed)
Pre-visit discussion using our clinic review tool. No additional management support is needed unless otherwise documented below in the visit note.  

## 2015-04-26 ENCOUNTER — Ambulatory Visit
Admission: RE | Admit: 2015-04-26 | Discharge: 2015-04-26 | Disposition: A | Discharge status: Home / Self Care | Payer: Medicare Other | Source: Ambulatory Visit | Attending: Internal Medicine | Admitting: Internal Medicine

## 2015-04-26 DIAGNOSIS — R110 Nausea: Secondary | ICD-10-CM | POA: Insufficient documentation

## 2015-04-26 DIAGNOSIS — R1084 Generalized abdominal pain: Secondary | ICD-10-CM | POA: Diagnosis not present

## 2015-04-26 DIAGNOSIS — R197 Diarrhea, unspecified: Secondary | ICD-10-CM | POA: Diagnosis not present

## 2015-04-26 DIAGNOSIS — K5730 Diverticulosis of large intestine without perforation or abscess without bleeding: Secondary | ICD-10-CM | POA: Diagnosis not present

## 2015-04-26 MED ORDER — IOPAMIDOL (ISOVUE-300) INJECTION 61%
100.0000 mL | Freq: Once | INTRAVENOUS | Status: AC | PRN
  Administered 2015-04-26: 100 mL via INTRAVENOUS

## 2015-04-27 ENCOUNTER — Emergency Department
Admission: EM | Admit: 2015-04-27 | Discharge: 2015-04-28 | Disposition: A | Discharge status: Home / Self Care | Payer: Medicare Other | Attending: Emergency Medicine | Admitting: Emergency Medicine

## 2015-04-27 ENCOUNTER — Encounter: Payer: Self-pay | Admitting: Emergency Medicine

## 2015-04-27 ENCOUNTER — Emergency Department: Payer: Medicare Other

## 2015-04-27 DIAGNOSIS — R1030 Lower abdominal pain, unspecified: Secondary | ICD-10-CM | POA: Insufficient documentation

## 2015-04-27 DIAGNOSIS — R197 Diarrhea, unspecified: Secondary | ICD-10-CM | POA: Insufficient documentation

## 2015-04-27 DIAGNOSIS — I10 Essential (primary) hypertension: Secondary | ICD-10-CM | POA: Insufficient documentation

## 2015-04-27 DIAGNOSIS — R1084 Generalized abdominal pain: Secondary | ICD-10-CM | POA: Diagnosis not present

## 2015-04-27 DIAGNOSIS — Z7982 Long term (current) use of aspirin: Secondary | ICD-10-CM | POA: Insufficient documentation

## 2015-04-27 DIAGNOSIS — R140 Abdominal distension (gaseous): Secondary | ICD-10-CM | POA: Insufficient documentation

## 2015-04-27 DIAGNOSIS — Z79899 Other long term (current) drug therapy: Secondary | ICD-10-CM | POA: Insufficient documentation

## 2015-04-27 DIAGNOSIS — R100 Acute abdomen: Secondary | ICD-10-CM | POA: Diagnosis not present

## 2015-04-27 DIAGNOSIS — Z7902 Long term (current) use of antithrombotics/antiplatelets: Secondary | ICD-10-CM | POA: Diagnosis not present

## 2015-04-27 LAB — URINALYSIS COMPLETE WITH MICROSCOPIC (ARMC ONLY)
BACTERIA UA: NONE SEEN
Bilirubin Urine: NEGATIVE
Glucose, UA: NEGATIVE mg/dL
HGB URINE DIPSTICK: NEGATIVE
Ketones, ur: NEGATIVE mg/dL
LEUKOCYTES UA: NEGATIVE
NITRITE: NEGATIVE
PH: 8 (ref 5.0–8.0)
PROTEIN: NEGATIVE mg/dL
SPECIFIC GRAVITY, URINE: 1.004 — AB (ref 1.005–1.030)

## 2015-04-27 LAB — CBC WITH DIFFERENTIAL/PLATELET
BASOS ABS: 0.1 10*3/uL (ref 0–0.1)
Eosinophils Absolute: 0.1 10*3/uL (ref 0–0.7)
Eosinophils Relative: 1 %
HEMATOCRIT: 35.5 % (ref 35.0–47.0)
Hemoglobin: 11.9 g/dL — ABNORMAL LOW (ref 12.0–16.0)
Lymphocytes Relative: 28 %
Lymphs Abs: 2.7 10*3/uL (ref 1.0–3.6)
MCH: 28.5 pg (ref 26.0–34.0)
MCHC: 33.5 g/dL (ref 32.0–36.0)
MCV: 85.3 fL (ref 80.0–100.0)
MONO ABS: 0.7 10*3/uL (ref 0.2–0.9)
Monocytes Relative: 7 %
NEUTROS ABS: 5.9 10*3/uL (ref 1.4–6.5)
Platelets: 334 10*3/uL (ref 150–440)
RBC: 4.17 MIL/uL (ref 3.80–5.20)
RDW: 14.1 % (ref 11.5–14.5)
WBC: 9.4 10*3/uL (ref 3.6–11.0)

## 2015-04-27 LAB — COMPREHENSIVE METABOLIC PANEL
ALBUMIN: 3.7 g/dL (ref 3.5–5.0)
ALT: 9 U/L — ABNORMAL LOW (ref 14–54)
AST: 19 U/L (ref 15–41)
Alkaline Phosphatase: 68 U/L (ref 38–126)
Anion gap: 8 (ref 5–15)
BILIRUBIN TOTAL: 0.3 mg/dL (ref 0.3–1.2)
BUN: 15 mg/dL (ref 6–20)
CHLORIDE: 107 mmol/L (ref 101–111)
CO2: 24 mmol/L (ref 22–32)
Calcium: 10.1 mg/dL (ref 8.9–10.3)
Creatinine, Ser: 0.67 mg/dL (ref 0.44–1.00)
GFR calc Af Amer: >60 mL/min (ref >60)
GFR calc non Af Amer: >60 mL/min (ref >60)
GLUCOSE: 96 mg/dL (ref 65–99)
POTASSIUM: 4.1 mmol/L (ref 3.5–5.1)
SODIUM: 139 mmol/L (ref 135–145)
TOTAL PROTEIN: 6.6 g/dL (ref 6.5–8.1)

## 2015-04-27 LAB — LIPASE, BLOOD: Lipase: 21 U/L (ref 11–51)

## 2015-04-27 MED ORDER — ONDANSETRON HCL 4 MG/2ML IJ SOLN
4.0000 mg | Freq: Once | INTRAMUSCULAR | Status: AC
  Administered 2015-04-27: 4 mg via INTRAVENOUS
  Filled 2015-04-27: qty 2

## 2015-04-27 MED ORDER — CIPROFLOXACIN HCL 500 MG PO TABS
500.0000 mg | ORAL_TABLET | Freq: Once | ORAL | Status: AC
  Administered 2015-04-27: 500 mg via ORAL
  Filled 2015-04-27: qty 1

## 2015-04-27 MED ORDER — METRONIDAZOLE 500 MG PO TABS
500.0000 mg | ORAL_TABLET | Freq: Once | ORAL | Status: AC
  Administered 2015-04-27: 500 mg via ORAL
  Filled 2015-04-27: qty 1

## 2015-04-27 MED ORDER — CIPROFLOXACIN HCL 500 MG PO TABS: 500.0000 mg | ORAL_TABLET | Freq: Two times a day (BID) | ORAL | Status: DC

## 2015-04-27 MED ORDER — MORPHINE SULFATE (PF) 4 MG/ML IV SOLN
4.0000 mg | Freq: Once | INTRAVENOUS | Status: AC
  Administered 2015-04-27: 4 mg via INTRAVENOUS
  Filled 2015-04-27: qty 1

## 2015-04-27 MED ORDER — METRONIDAZOLE 500 MG PO TABS: 500.0000 mg | ORAL_TABLET | Freq: Three times a day (TID) | ORAL | Status: DC

## 2015-04-27 MED ORDER — DICYCLOMINE HCL 20 MG PO TABS: 20.0000 mg | ORAL_TABLET | Freq: Three times a day (TID) | ORAL | Status: DC | PRN

## 2015-04-27 MED ORDER — DICYCLOMINE HCL 10 MG PO CAPS
10.0000 mg | ORAL_CAPSULE | Freq: Once | ORAL | Status: AC
  Administered 2015-04-27: 10 mg via ORAL
  Filled 2015-04-27: qty 1

## 2015-04-27 NOTE — ED Provider Notes (Signed)
[facility] Emergency Department Provider Note   ____________________________________________  Time seen: ~2130  I have reviewed the triage vital signs and the nursing notes.   HISTORY  Chief Complaint Abdominal Pain   History limited by: Not Limited   HPI Tamara [name] is a 82 y.o. female who presents to the emergency department today because of concerns for abdominal pain. Is located primarily in the lower abdomen. It started about 3 weeks ago. Has been constant. It is progressively gotten worse. It is a little bit better if she is able to pass gas. She also had associated diarrhea. She has seen her primary care doctor for this and had a CT scan performed yesterday which she states was normal. She states she also had blood work done last week which did not show any obvious etiology of the pain. Patient denies any fevers.    Past Medical History  Diagnosis Date  . Hypertension   . Hypercholesterolemia   . GERD (gastroesophageal reflux disease)     EGD - gastritis/mild duodenitis, previous positive H. pylori  . Tubular adenoma of colon   . Fibrocystic breast disease   . Degenerative joint disease   . Osteopenia   . History of depression   . History of skin cancer     facial  . Vitamin D deficiency   . Chicken pox   . Glaucoma   . Migraines   . Diverticulitis   . Anemia   . Helicobacter pylori ab+   . Colitis   . Internal hemorrhoids   . Arthritis   . IBS (irritable bowel syndrome)   . Stroke ([place])   . Heart murmur   . Diverticulosis     Patient Active Problem List   Diagnosis Date Noted  . Abdominal pain 04/25/2015  . Diarrhea 04/15/2015  . Right knee pain 02/13/2015  . Hyperglycemia 08/11/2014  . Murmur, heart 05/17/2014  . Hyperlipidemia 05/17/2014  . Health care maintenance 04/17/2014  . Carotid stenosis 11/15/2013  . Dysphagia 10/28/2013  . Back pain 06/06/2013  . Internal nasal lesion 06/06/2013  . Fatigue 05/09/2013  .  Fluttering heart ([place]) 10/12/2012  . CVA (cerebral vascular accident) ([place]) 09/28/2012  . Left carotid bruit 01/17/2012  . Osteopenia 01/17/2012  . GERD (gastroesophageal reflux disease) 01/17/2012  . Depression 01/17/2012  . Hypertension 12/18/2011  . Hypercholesterolemia 12/18/2011    Past Surgical History  Procedure Laterality Date  . Appendectomy    . Bilateral salpingoophorectomy  2004    complex ovarian cyst (left) - benign  . Ventral hernia repair  2005    Dr. Tamala Julian  . Abdominal hysterectomy  1973    secondary to bleeding  . Mandible fracture surgery    . Breast biopsy Bilateral     x4    Current Outpatient Rx  Name  Route  Sig  Dispense  Refill  . aspirin 81 MG tablet   Oral   Take 81 mg by mouth daily.         . clopidogrel (PLAVIX) 75 MG tablet   Oral   Take 1 tablet (75 mg total) by mouth daily.   90 tablet   3   . losartan (COZAAR) 50 MG tablet   Oral   Take 1 tablet (50 mg total) by mouth daily.   90 tablet   1   . lovastatin (MEVACOR) 40 MG tablet   Oral   Take 1 tablet (40 mg total) by mouth at bedtime.   Comal  tablet   1   . PARoxetine (PAXIL) 30 MG tablet   Oral   Take 1 tablet (30 mg total) by mouth daily.   30 tablet   2   . temazepam (RESTORIL) 15 MG capsule   Oral   Take 1 capsule (15 mg total) by mouth at bedtime.   90 capsule   0     Allergies Prednisone; Shrimp; and Vicodin  Family History  Problem Relation Age of Onset  . Esophageal cancer Mother   . Stomach cancer Mother   . Cirrhosis Brother   . Parkinson's disease Brother   . Ovarian cancer Sister   . Colon cancer Sister     half-sister  . Heart disease Father     Social History Social History  Substance Use Topics  . Smoking status: Never Smoker   . Smokeless tobacco: Never Used  . Alcohol Use: No    Review of Systems  Constitutional: Negative for fever. Cardiovascular: Negative for chest pain. Respiratory: Negative for shortness of  breath. Gastrointestinal: Positive for abdominal pain, distention and diarrhea. Neurological: Negative for headaches, focal weakness or numbness.  10-point ROS otherwise negative.  ____________________________________________   PHYSICAL EXAM:  VITAL SIGNS: ED Triage Vitals  Enc Vitals Group     BP 04/27/15 2129 193/91 mmHg     Pulse Rate 04/27/15 2129 81     Resp 04/27/15 2129 21     Temp 04/27/15 2129 98.1 F (36.7 C)     Temp Source 04/27/15 2129 Oral     SpO2 04/27/15 2129 97 %     Weight 04/27/15 2129 145 lb (65.772 kg)     Height 04/27/15 2129 5\' 4"  (1.626 m)     Head Cir --      Peak Flow --      Pain Score 04/27/15 2129 9   Constitutional: Awake, alert, oriented. Appears uncomfortable.  Eyes: Conjunctivae are normal. PERRL. Normal extraocular movements. ENT   Head: Normocephalic and atraumatic.   Nose: No congestion/rhinnorhea.   Mouth/Throat: Mucous membranes are moist.   Neck: No stridor. Hematological/Lymphatic/Immunilogical: No cervical lymphadenopathy. Cardiovascular: Normal rate, regular rhythm.  No murmurs, rubs, or gallops. Respiratory: Normal respiratory effort without tachypnea nor retractions. Breath sounds are clear and equal bilaterally. No wheezes/rales/rhonchi. Gastrointestinal: Soft. Slightly distended. Tender to palpation.  Genitourinary: Deferred Musculoskeletal: Normal range of motion in all extremities. No joint effusions.  No lower extremity tenderness nor edema. Neurologic:  Normal speech and language. No gross focal neurologic deficits are appreciated.  Skin:  Skin is warm, dry and intact. No rash noted. Psychiatric: Mood and affect are normal. Speech and behavior are normal. Patient exhibits appropriate insight and judgment.  ____________________________________________    LABS (pertinent positives/negatives)  Labs Reviewed  CBC WITH DIFFERENTIAL/PLATELET - Abnormal; Notable for the following:    Hemoglobin 11.9 (*)    All  other components within normal limits  COMPREHENSIVE METABOLIC PANEL - Abnormal; Notable for the following:    ALT 9 (*)    All other components within normal limits  URINALYSIS COMPLETEWITH MICROSCOPIC ([facility] ONLY) - Abnormal; Notable for the following:    Color, Urine STRAW (*)    APPearance CLEAR (*)    Specific Gravity, Urine 1.004 (*)    Squamous Epithelial / LPF 0-5 (*)    All other components within normal limits  LIPASE, BLOOD     ____________________________________________   EKG  None  ____________________________________________    RADIOLOGY  Acute abd IMPRESSION: 1. Contrast noted filling  the colon. Unremarkable bowel gas pattern; no free intra-abdominal air seen. 2. Mild elevation the right hemidiaphragm. Mild peribronchial thickening noted. Lungs otherwise grossly clear.  ____________________________________________   PROCEDURES  Procedure(s) performed: None  Critical Care performed: No  ____________________________________________   INITIAL IMPRESSION / ASSESSMENT AND PLAN / ED COURSE  Pertinent labs & imaging results that were available during my care of the patient were reviewed by me and considered in my medical decision making (see chart for details).  Patient presented to the emergency department today because of concerns for continued lower abdominal pain. Patient had CT scan done yesterday without any concerning findings. Blood work and x-ray done today again without any overly concerning findings. However given that patient does have diverticular disease will treat for possible diverticulitis.  ____________________________________________   FINAL CLINICAL IMPRESSION(S) / ED DIAGNOSES  Final diagnoses:  Lower abdominal pain     Nance Pear, MD 04/29/15 [id number]

## 2015-04-27 NOTE — Discharge Instructions (Signed)
Please seek medical attention for any high fevers, chest pain, shortness of breath, change in behavior, persistent vomiting, bloody stool or any other new or concerning symptoms. ° ° °Abdominal Pain, Adult °Many things can cause belly (abdominal) pain. Most times, the belly pain is not dangerous. Many cases of belly pain can be watched and treated at home. °HOME CARE  °· Do not take medicines that help you go poop (laxatives) unless told to by your doctor. °· Only take medicine as told by your doctor. °· Eat or drink as told by your doctor. Your doctor will tell you if you should be on a special diet. °GET HELP IF: °· You do not know what is causing your belly pain. °· You have belly pain while you are sick to your stomach (nauseous) or have runny poop (diarrhea). °· You have pain while you pee or poop. °· Your belly pain wakes you up at night. °· You have belly pain that gets worse or better when you eat. °· You have belly pain that gets worse when you eat fatty foods. °· You have a fever. °GET HELP RIGHT AWAY IF:  °· The pain does not go away within 2 hours. °· You keep throwing up (vomiting). °· The pain changes and is only in the right or left part of the belly. °· You have bloody or tarry looking poop. °MAKE SURE YOU:  °· Understand these instructions. °· Will watch your condition. °· Will get help right away if you are not doing well or get worse. °  °This information is not intended to replace advice given to you by your health care provider. Make sure you discuss any questions you have with your health care provider. °  °Document Released: 07/03/2007 Document Revised: 02/04/2014 Document Reviewed: 09/23/2012 °Elsevier Interactive Patient Education ©2016 [employer] ° °

## 2015-04-27 NOTE — ED Notes (Signed)
Pt arrived via EMS from home. Pt has had lower abd pain for the last several weeks, was seen by her PCP on Tuesday for a workup and things looked ok. Today abd pain got much worse. Pt presents with bloated lower abd that is very tight. Pt denies N/V/D, but reports that she had diarrhea all throughout the beginning of the month. That stopped approximately one week ago.

## 2015-04-28 ENCOUNTER — Other Ambulatory Visit: Payer: Self-pay | Admitting: Internal Medicine

## 2015-04-28 DIAGNOSIS — R1030 Lower abdominal pain, unspecified: Secondary | ICD-10-CM | POA: Diagnosis not present

## 2015-04-28 NOTE — Progress Notes (Signed)
Order placed for GI referral.   

## 2015-04-28 NOTE — ED Notes (Signed)
Discharge instructions reviewed with patient. Patient verbalized understanding. Patient ambulated to lobby without difficulty.   

## 2015-05-01 ENCOUNTER — Telehealth: Payer: Self-pay | Admitting: Internal Medicine

## 2015-05-01 ENCOUNTER — Encounter: Payer: Self-pay | Admitting: Internal Medicine

## 2015-05-01 NOTE — Assessment & Plan Note (Signed)
Discussed cholesterol results.  Discussed importance of taking her medication on a regular basis.  Increase lovastatin to 40mg  q day.  Follow lipid panel and liver function tests.

## 2015-05-01 NOTE — Assessment & Plan Note (Signed)
On paxil.  Stable.  

## 2015-05-01 NOTE — Telephone Encounter (Signed)
Left a VM for Daughter to give me a call back.

## 2015-05-01 NOTE — Telephone Encounter (Signed)
Pt daughter Hal Hope called regarding her mom taking the dicyclomine (BENTYL) 20 MG tablet daughter states it made her stomach have spasms and vomiting. Also metroNIDAZOLE (FLAGYL) 500 MG tablet and ciprofloxacin (CIPRO) 500 MG tablet made pt sick for two days. Candace says she will need something else not as strong. Call daughter @ [phone]. Thank you!

## 2015-05-01 NOTE — Telephone Encounter (Signed)
I would avoid narcotic medication because that could affect the bowel.  Tylenol can be used to avoid alleve upsetting the stomach.  If needs to be seen earlier or acute issues, let us know.

## 2015-05-01 NOTE — Telephone Encounter (Signed)
Reviewed CT scan.  No evidence of diverticulitis or other acute finding.  Would remain off abx for now.  Take probiotic.  It appears that symptoms are improving since off medication.  Melissa has contacted them with earlier appt with Dr Allen Norris.  Does she feel she needs to be seen earlier?  Bland foods.  Stay hydrated.  Remain off medication.  Call with update.

## 2015-05-01 NOTE — Telephone Encounter (Signed)
Spoke with Patients daughter she will advise her to not use the aleve and try tylenol.  They will call if needed.

## 2015-05-01 NOTE — Telephone Encounter (Signed)
Spoke with Candace the daughter.  Patient was taken to the ED on Thursday due to increased Abdominal pain.  ED doctor put patient on the three above medication as per the daughter he thought he saw some diverticuli spots on the CT scan.  Patient took all three for approximately 2 days and symptoms got worse.  She started having severe upset stomach on Saturday and pain increased.  Daughter advised patient to stop all the medications.  Since stopping on Sunday still having intermittent abdominal pain from right to left lower quadrants, it is sharp in nature when it occurs.  She has a appointment with GI but it is not until next month.  Please advise?

## 2015-05-01 NOTE — Assessment & Plan Note (Signed)
Persistent.  Avoid immodium.  Continue probiotic.  Is some better.  With her history and with the persistent right side pain, will obtain CT scan abdomen and pelvis.  Further w/up pending results.  May need referral to GI.

## 2015-05-01 NOTE — Telephone Encounter (Signed)
Got a earlier appt with the GI doctors on April 19th, and was very Patent attorney.  Aleve has been helping the pain currently  Every 4-6 hours.   Pain is definetly not as bad as it was, can she continue the aleve or would you suggest something else?

## 2015-05-01 NOTE — Assessment & Plan Note (Signed)
Blood pressure as outlined.  Continue same medication regimen.  Follow pressures.  Follow metabolic panel.  

## 2015-05-01 NOTE — Assessment & Plan Note (Signed)
On aspirin and plavix.  No reoccurring symptoms or problems.  Follow.

## 2015-05-01 NOTE — Assessment & Plan Note (Signed)
Low carb diet and exercise.  Follow met b and a1c.   

## 2015-05-01 NOTE — Assessment & Plan Note (Signed)
Right side abdominal pain as outlined.  Probiotic.  Check CT.  Further w/up pending results.

## 2015-05-02 ENCOUNTER — Encounter (HOSPITAL_COMMUNITY): Payer: Self-pay | Admitting: Emergency Medicine

## 2015-05-02 ENCOUNTER — Emergency Department
Admission: EM | Admit: 2015-05-02 | Discharge: 2015-05-02 | Disposition: A | Discharge status: Home / Self Care | Payer: Medicare Other | Attending: Emergency Medicine | Admitting: Emergency Medicine

## 2015-05-02 ENCOUNTER — Emergency Department (HOSPITAL_COMMUNITY)
Admission: EM | Admit: 2015-05-02 | Discharge: 2015-05-02 | Disposition: A | Discharge status: Home / Self Care | Payer: Medicare Other | Attending: Emergency Medicine | Admitting: Emergency Medicine

## 2015-05-02 DIAGNOSIS — R102 Pelvic and perineal pain: Secondary | ICD-10-CM | POA: Insufficient documentation

## 2015-05-02 DIAGNOSIS — Z86018 Personal history of other benign neoplasm: Secondary | ICD-10-CM | POA: Diagnosis not present

## 2015-05-02 DIAGNOSIS — E7800 Pure hypercholesterolemia, unspecified: Secondary | ICD-10-CM | POA: Diagnosis not present

## 2015-05-02 DIAGNOSIS — I10 Essential (primary) hypertension: Secondary | ICD-10-CM | POA: Diagnosis not present

## 2015-05-02 DIAGNOSIS — Z85828 Personal history of other malignant neoplasm of skin: Secondary | ICD-10-CM | POA: Insufficient documentation

## 2015-05-02 DIAGNOSIS — Z79899 Other long term (current) drug therapy: Secondary | ICD-10-CM | POA: Insufficient documentation

## 2015-05-02 DIAGNOSIS — R1084 Generalized abdominal pain: Secondary | ICD-10-CM

## 2015-05-02 DIAGNOSIS — F329 Major depressive disorder, single episode, unspecified: Secondary | ICD-10-CM | POA: Insufficient documentation

## 2015-05-02 DIAGNOSIS — R011 Cardiac murmur, unspecified: Secondary | ICD-10-CM | POA: Insufficient documentation

## 2015-05-02 DIAGNOSIS — Z8673 Personal history of transient ischemic attack (TIA), and cerebral infarction without residual deficits: Secondary | ICD-10-CM | POA: Insufficient documentation

## 2015-05-02 DIAGNOSIS — Z7902 Long term (current) use of antithrombotics/antiplatelets: Secondary | ICD-10-CM | POA: Diagnosis not present

## 2015-05-02 DIAGNOSIS — M1990 Unspecified osteoarthritis, unspecified site: Secondary | ICD-10-CM | POA: Insufficient documentation

## 2015-05-02 DIAGNOSIS — Z9049 Acquired absence of other specified parts of digestive tract: Secondary | ICD-10-CM | POA: Insufficient documentation

## 2015-05-02 DIAGNOSIS — Z90710 Acquired absence of both cervix and uterus: Secondary | ICD-10-CM | POA: Insufficient documentation

## 2015-05-02 DIAGNOSIS — R1031 Right lower quadrant pain: Secondary | ICD-10-CM | POA: Insufficient documentation

## 2015-05-02 DIAGNOSIS — Z8719 Personal history of other diseases of the digestive system: Secondary | ICD-10-CM | POA: Insufficient documentation

## 2015-05-02 DIAGNOSIS — Z7982 Long term (current) use of aspirin: Secondary | ICD-10-CM | POA: Diagnosis not present

## 2015-05-02 DIAGNOSIS — R109 Unspecified abdominal pain: Secondary | ICD-10-CM | POA: Diagnosis present

## 2015-05-02 DIAGNOSIS — Z8669 Personal history of other diseases of the nervous system and sense organs: Secondary | ICD-10-CM | POA: Diagnosis not present

## 2015-05-02 DIAGNOSIS — R1032 Left lower quadrant pain: Secondary | ICD-10-CM | POA: Insufficient documentation

## 2015-05-02 DIAGNOSIS — Z8619 Personal history of other infectious and parasitic diseases: Secondary | ICD-10-CM | POA: Insufficient documentation

## 2015-05-02 DIAGNOSIS — Z862 Personal history of diseases of the blood and blood-forming organs and certain disorders involving the immune mechanism: Secondary | ICD-10-CM | POA: Diagnosis not present

## 2015-05-02 DIAGNOSIS — R1030 Lower abdominal pain, unspecified: Secondary | ICD-10-CM

## 2015-05-02 LAB — COMPREHENSIVE METABOLIC PANEL
ALT: 10 U/L — AB (ref 14–54)
ANION GAP: 11 (ref 5–15)
AST: 18 U/L (ref 15–41)
Albumin: 3.2 g/dL — ABNORMAL LOW (ref 3.5–5.0)
Alkaline Phosphatase: 54 U/L (ref 38–126)
BUN: 12 mg/dL (ref 6–20)
CHLORIDE: 106 mmol/L (ref 101–111)
CO2: 24 mmol/L (ref 22–32)
CREATININE: 0.85 mg/dL (ref 0.44–1.00)
Calcium: 9 mg/dL (ref 8.9–10.3)
GFR calc non Af Amer: >60 mL/min (ref >60)
Glucose, Bld: 94 mg/dL (ref 65–99)
POTASSIUM: 3.7 mmol/L (ref 3.5–5.1)
SODIUM: 141 mmol/L (ref 135–145)
Total Bilirubin: 0.3 mg/dL (ref 0.3–1.2)
Total Protein: 5.8 g/dL — ABNORMAL LOW (ref 6.5–8.1)

## 2015-05-02 LAB — CBC WITH DIFFERENTIAL/PLATELET
Basophils Absolute: 0 10*3/uL (ref 0.0–0.1)
Basophils Relative: 0 %
EOS ABS: 0.1 10*3/uL (ref 0.0–0.7)
EOS PCT: 1 %
HCT: 33 % — ABNORMAL LOW (ref 36.0–46.0)
Hemoglobin: 11 g/dL — ABNORMAL LOW (ref 12.0–15.0)
LYMPHS ABS: 1.4 10*3/uL (ref 0.7–4.0)
Lymphocytes Relative: 26 %
MCH: 28.8 pg (ref 26.0–34.0)
MCHC: 33.3 g/dL (ref 30.0–36.0)
MCV: 86.4 fL (ref 78.0–100.0)
Monocytes Absolute: 0.5 10*3/uL (ref 0.1–1.0)
Monocytes Relative: 9 %
Neutro Abs: 3.5 10*3/uL (ref 1.7–7.7)
Neutrophils Relative %: 64 %
PLATELETS: 338 10*3/uL (ref 150–400)
RBC: 3.82 MIL/uL — AB (ref 3.87–5.11)
RDW: 14.1 % (ref 11.5–15.5)
WBC: 5.5 10*3/uL (ref 4.0–10.5)

## 2015-05-02 LAB — LIPASE, BLOOD: Lipase: 28 U/L (ref 11–51)

## 2015-05-02 LAB — I-STAT CG4 LACTIC ACID, ED: LACTIC ACID, VENOUS: 0.61 mmol/L (ref 0.5–2.0)

## 2015-05-02 MED ORDER — OXYCODONE-ACETAMINOPHEN 5-325 MG PO TABS: 0.5000 | ORAL_TABLET | ORAL | Status: DC | PRN

## 2015-05-02 MED ORDER — IBUPROFEN 400 MG PO TABS
200.0000 mg | ORAL_TABLET | Freq: Once | ORAL | Status: AC
  Administered 2015-05-02: 200 mg via ORAL
  Filled 2015-05-02: qty 1

## 2015-05-02 MED ORDER — ONDANSETRON HCL 4 MG PO TABS
4.0000 mg | ORAL_TABLET | Freq: Once | ORAL | Status: AC
  Administered 2015-05-02: 4 mg via ORAL
  Filled 2015-05-02: qty 1

## 2015-05-02 MED ORDER — ONDANSETRON HCL 4 MG/2ML IJ SOLN
4.0000 mg | Freq: Once | INTRAMUSCULAR | Status: AC
  Administered 2015-05-02: 4 mg via INTRAVENOUS
  Filled 2015-05-02: qty 2

## 2015-05-02 MED ORDER — MORPHINE SULFATE (PF) 2 MG/ML IV SOLN
2.0000 mg | Freq: Once | INTRAVENOUS | Status: AC
Start: 2015-05-02 — End: 2015-05-02
  Administered 2015-05-02: 2 mg via INTRAVENOUS
  Filled 2015-05-02: qty 1

## 2015-05-02 MED ORDER — SODIUM CHLORIDE 0.9 % IV SOLN
Freq: Once | INTRAVENOUS | Status: AC
  Administered 2015-05-02: 1000 mL via INTRAVENOUS

## 2015-05-02 MED ORDER — ONDANSETRON HCL 4 MG PO TABS: 4.0000 mg | ORAL_TABLET | Freq: Four times a day (QID) | ORAL | Status: DC

## 2015-05-02 NOTE — ED Notes (Signed)
Went to assess pt and her daughter arrived and stated that she wanted her transferred to [facility] prior to [place] doing any work up - I reported this to Dr Marcelene Butte and to Modena Nunnery RN charge nurse - Pt was taken to the daughters car via wheelchair after administration of medications ordered by Dr Marcelene Butte

## 2015-05-02 NOTE — Telephone Encounter (Signed)
Patient called [facility] last evening and was advised to go to ED as her abdominal pain increased last night and Tylenol and Aleve weren't helping.  Daughter called this am, attempted to call her back to confirm symptoms this am, but left a VM.  Can we attempt to move up appt with GI?  Per below patient was given Percocet for pain in ED.

## 2015-05-02 NOTE — ED Notes (Signed)
Attempted disimpaction, no hard stool noted. Patient was able to pass gas freely.

## 2015-05-02 NOTE — Telephone Encounter (Signed)
Duplicate message.  See other message.

## 2015-05-02 NOTE — ED Notes (Signed)
Patient comes via POV from [facility] for abdominal pain, reports having xray done there. Previously had CT scan done at [facility] imaging as outpatient that didn't show anything. Reports abdominal pain on left and right side, across the bottom. Patient has been able to pass stools, with intermittent difficulty. Reports mild nausea, no vomiting.

## 2015-05-02 NOTE — Discharge Instructions (Signed)
Abdominal Pain, Adult °Many things can cause abdominal pain. Usually, abdominal pain is not caused by a disease and will improve without treatment. It can often be observed and treated at home. Your health care provider will do a physical exam and possibly order blood tests and X-rays to help determine the seriousness of your pain. However, in many cases, more time must pass before a clear cause of the pain can be found. Before that point, your health care provider may not know if you need more testing or further treatment. °HOME CARE INSTRUCTIONS °Monitor your abdominal pain for any changes. The following actions may help to alleviate any discomfort you are experiencing: °· Only take over-the-counter or prescription medicines as directed by your health care provider. °· Do not take laxatives unless directed to do so by your health care provider. °· Try a clear liquid diet (broth, tea, or water) as directed by your health care provider. Slowly move to a bland diet as tolerated. °SEEK MEDICAL CARE IF: °· You have unexplained abdominal pain. °· You have abdominal pain associated with nausea or diarrhea. °· You have pain when you urinate or have a bowel movement. °· You experience abdominal pain that wakes you in the night. °· You have abdominal pain that is worsened or improved by eating food. °· You have abdominal pain that is worsened with eating fatty foods. °· You have a fever. °SEEK IMMEDIATE MEDICAL CARE IF: °· Your pain does not go away within 2 hours. °· You keep throwing up (vomiting). °· Your pain is felt only in portions of the abdomen, such as the right side or the left lower portion of the abdomen. °· You pass bloody or black tarry stools. °MAKE SURE YOU: °· Understand these instructions. °· Will watch your condition. °· Will get help right away if you are not doing well or get worse. °  °This information is not intended to replace advice given to you by your health care provider. Make sure you discuss  any questions you have with your health care provider. °  °Document Released: 10/24/2004 Document Revised: 10/05/2014 Document Reviewed: 09/23/2012 °Elsevier Interactive Patient Education ©2016 [employer] ° ° °Please return immediately if condition worsens. Please contact her primary physician or the physician you were given for referral. If you have any specialist physicians involved in her treatment and plan please also contact them. Thank you for using [place] [facility]. ° °

## 2015-05-02 NOTE — Telephone Encounter (Signed)
Pt called back returning your call. Thank you! °

## 2015-05-02 NOTE — Telephone Encounter (Signed)
I reviewed the phone notes.  It appears she is doing better with the current pain medication.  I tried to call Dr Dorothey Baseman office today.  They are closed and will reopen tomorrow.  We called yesterday and they moved the appt up to the day in April they were given.  I can try tomorrow to call and get an earlier appt.  Let me know if needs anything more

## 2015-05-02 NOTE — Telephone Encounter (Signed)
Spoke with the daughter, she was on her way back to her mom's house.  Yesterday evening pain got so severe that she called our after hours and they advised her to take her mom to the ER at [facility].  She took her to [facility] thinking they could transport her there and said they politely told her that she would need to take her there. So they went and were evaluated there and released.  Her mom is much better now that she is on higher pain medications.  They reviewed the scans and no changes there either.  Patient was discharged with Percocet and zofran as needed.  As a FYI daughter stated that prolonged Tylenol per the patient causes a rash with the patient.   Please advise.

## 2015-05-02 NOTE — ED Provider Notes (Signed)
CSN: [id number]     Arrival date & time 05/02/15  0402 History   First MD Initiated Contact with Patient 05/02/15 0440     Chief Complaint  Patient presents with  . Abdominal Pain     (Consider location/radiation/quality/duration/timing/severity/associated sxs/prior Treatment) HPI Comments: Patient presents to the ER for evaluation of abdominal pain. Patient has been experiencing abdominal pain for a month. She has been seen by her primary doctor as well as [facility]. Patient has had plain film x-rays, CAT scans, labs performed. CAT scan showed diverticulosis without diverticulitis, but she was treated with Cipro, Flagyl and Bentyl. Up until this time, all workups have been negative, no abnormalities have been identified. Patient was seen again at [facility], left the ER there to be brought to the ER by her family by private vehicle.  Patient is complaining of dull aching pain in the bilateral pelvic region with intermittent episodes of sharp stabbing pains that are quite severe. She has not had a nausea, vomiting, diarrhea. She has been having normal bowel movements.  Patient is a 82 y.o. female presenting with abdominal pain.  Abdominal Pain   Past Medical History  Diagnosis Date  . Hypertension   . Hypercholesterolemia   . GERD (gastroesophageal reflux disease)     EGD - gastritis/mild duodenitis, previous positive H. pylori  . Tubular adenoma of colon   . Fibrocystic breast disease   . Degenerative joint disease   . Osteopenia   . History of depression   . History of skin cancer     facial  . Vitamin D deficiency   . Chicken pox   . Glaucoma   . Migraines   . Diverticulitis   . Anemia   . Helicobacter pylori ab+   . Colitis   . Internal hemorrhoids   . Arthritis   . IBS (irritable bowel syndrome)   . Stroke ([place])   . Heart murmur   . Diverticulosis    Past Surgical History  Procedure Laterality Date  . Appendectomy    .  Bilateral salpingoophorectomy  2004    complex ovarian cyst (left) - benign  . Ventral hernia repair  2005    Dr. Tamala Julian  . Abdominal hysterectomy  1973    secondary to bleeding  . Mandible fracture surgery    . Breast biopsy Bilateral     x4   Family History  Problem Relation Age of Onset  . Esophageal cancer Mother   . Stomach cancer Mother   . Cirrhosis Brother   . Parkinson's disease Brother   . Ovarian cancer Sister   . Colon cancer Sister     half-sister  . Heart disease Father    Social History  Substance Use Topics  . Smoking status: Never Smoker   . Smokeless tobacco: Never Used  . Alcohol Use: No   OB History    No data available     Review of Systems  Gastrointestinal: Positive for abdominal pain.  All other systems reviewed and are negative.     Allergies  Flagyl; Other; Prednisone; Shrimp; and Vicodin  Home Medications   Prior to Admission medications   Medication Sig Start Date End Date Taking? Authorizing Provider  aspirin 81 MG tablet Take 81 mg by mouth daily.   Yes Historical Provider, MD  clopidogrel (PLAVIX) 75 MG tablet Take 1 tablet (75 mg total) by mouth daily. 02/16/14  Yes Minna Merritts, MD  losartan (COZAAR) 50 MG tablet Take  1 tablet (50 mg total) by mouth daily. 04/25/15  Yes Einar Pheasant, MD  lovastatin (MEVACOR) 40 MG tablet Take 1 tablet (40 mg total) by mouth at bedtime. 04/25/15  Yes Einar Pheasant, MD  PARoxetine (PAXIL) 30 MG tablet Take 1 tablet (30 mg total) by mouth daily. 02/13/15  Yes Einar Pheasant, MD  Probiotic Product (PROBIOTIC PO) Take 1 capsule by mouth daily.   Yes Historical Provider, MD  temazepam (RESTORIL) 15 MG capsule Take 1 capsule (15 mg total) by mouth at bedtime. 04/25/15  Yes Einar Pheasant, MD   BP 155/71 mmHg  Pulse 67  Temp(Src) 98.3 F (36.8 C) (Oral)  Resp 20  Ht 5\' 3"  (1.6 m)  Wt 145 lb (65.772 kg)  BMI 25.69 kg/m2  SpO2 97%  LMP 01/17/1972 Physical Exam  Constitutional: She is oriented to  person, place, and time. She appears well-developed and well-nourished. No distress.  HENT:  Head: Normocephalic and atraumatic.  Right Ear: Hearing normal.  Left Ear: Hearing normal.  Nose: Nose normal.  Mouth/Throat: Oropharynx is clear and moist and mucous membranes are normal.  Eyes: Conjunctivae and EOM are normal. Pupils are equal, round, and reactive to light.  Neck: Normal range of motion. Neck supple.  Cardiovascular: Regular rhythm, S1 normal and S2 normal.  Exam reveals no gallop and no friction rub.   No murmur heard. Pulmonary/Chest: Effort normal and breath sounds normal. No respiratory distress. She exhibits no tenderness.  Abdominal: Soft. Normal appearance and bowel sounds are normal. There is no hepatosplenomegaly. There is tenderness in the right lower quadrant, suprapubic area and left lower quadrant. There is no rebound, no guarding, no tenderness at McBurney's point and negative Murphy's sign. No hernia.  Musculoskeletal: Normal range of motion.  Neurological: She is alert and oriented to person, place, and time. She has normal strength. No cranial nerve deficit or sensory deficit. Coordination normal. GCS eye subscore is 4. GCS verbal subscore is 5. GCS motor subscore is 6.  Skin: Skin is warm, dry and intact. No rash noted. No cyanosis.  Psychiatric: She has a normal mood and affect. Her speech is normal and behavior is normal. Thought content normal.  Nursing note and vitals reviewed.   ED Course  Procedures (including critical care time) Labs Review Labs Reviewed  CBC WITH DIFFERENTIAL/PLATELET - Abnormal; Notable for the following:    RBC 3.82 (*)    Hemoglobin 11.0 (*)    HCT 33.0 (*)    All other components within normal limits  COMPREHENSIVE METABOLIC PANEL - Abnormal; Notable for the following:    Total Protein 5.8 (*)    Albumin 3.2 (*)    ALT 10 (*)    All other components within normal limits  LIPASE, BLOOD  URINALYSIS, ROUTINE W REFLEX MICROSCOPIC  (NOT AT [facility])  I-STAT CG4 LACTIC ACID, ED    Imaging Review No results found. I have personally reviewed and evaluated these images and lab results as part of my medical decision-making.   EKG Interpretation None      MDM   Final diagnoses:  None  Abdominal pain  Patient seen in the ER today for abdominal pain. Pain is been ongoing for a month. She has been worked up and outside [place] and by primary doctor. She has had a very thorough examination. Images from CT scan, x-ray were reviewed. Previous labs were reviewed. All of her labs were repeated today and there are no acute abnormalities noted. No sign of infection. I do not  see any reason for repeat imaging. Patient feeling much better after morphine. Family is frustrated because she has not been provided analgesia. I do not see any reason why she cannot be given analgesia to be used at home. She has established a follow-up appoint with gastroenterology.    Orpah Greek, MD 05/02/15 [phone]

## 2015-05-02 NOTE — ED Notes (Signed)
Arrived via ems for c/o mid to lower abd pain - dx last week with diverticulitis

## 2015-05-02 NOTE — Discharge Instructions (Signed)

## 2015-05-02 NOTE — ED Notes (Signed)
Pt assisted to bedside commode by this RN and pt's daughter.

## 2015-05-02 NOTE — Telephone Encounter (Signed)
Pt daughter called and stated that her mom has gotten worse. They just left the ED this morning. And the aleve and tylenol was not working at all. Pt was given percocet. Candace states that the appt with Dr Allen Norris needs to be sooner if possible. Call daughter @ [phone]. Thank you!

## 2015-05-02 NOTE — ED Provider Notes (Signed)
Time Seen: Approximately 0 3:30  I have reviewed the triage notes  Chief Complaint: Abdominal Pain   History of Present Illness: Tamara [name] is a 82 y.o. female *who has a history of recurrent abdominal pain. Patient's been seen and evaluated here recently on March 30. She's also had a recent CAT scan performed by her primary physician. EMS was notified and the family requested to go to [facility] because that's where her gastroenterologist are located. The EMS could not transport them the [facility] in the family did not know that and the daughter was present and the patient wished to go to [facility]. In the ladies concerns prior to my evaluation. The patient to family requests [place] not to do any test here in a wish to be discharged and proceeded to [facility]. We offered pain relief and they elected to have some Zofran which he's tolerated well in the past along with ibuprofen. Patient does not seem to be in any significant distress her abdominal pain as described in both lateral areas of her abdomen. Had some nausea with dry heaves but no actual emesis. Patient has been having bowel movements which has not been any melena or hematochezia. As per the patient and family's wishes we did not perform any laboratory work or radiologic evaluation Past Medical History  Diagnosis Date  . Hypertension   . Hypercholesterolemia   . GERD (gastroesophageal reflux disease)     EGD - gastritis/mild duodenitis, previous positive H. pylori  . Tubular adenoma of colon   . Fibrocystic breast disease   . Degenerative joint disease   . Osteopenia   . History of depression   . History of skin cancer     facial  . Vitamin D deficiency   . Chicken pox   . Glaucoma   . Migraines   . Diverticulitis   . Anemia   . Helicobacter pylori ab+   . Colitis   . Internal hemorrhoids   . Arthritis   . IBS (irritable bowel syndrome)   . Stroke ([place])   . Heart murmur   . Diverticulosis      Patient Active Problem List   Diagnosis Date Noted  . Abdominal pain 04/25/2015  . Diarrhea 04/15/2015  . Right knee pain 02/13/2015  . Hyperglycemia 08/11/2014  . Murmur, heart 05/17/2014  . Hyperlipidemia 05/17/2014  . Health care maintenance 04/17/2014  . Carotid stenosis 11/15/2013  . Dysphagia 10/28/2013  . Back pain 06/06/2013  . Internal nasal lesion 06/06/2013  . Fatigue 05/09/2013  . Fluttering heart ([place]) 10/12/2012  . CVA (cerebral vascular accident) ([place]) 09/28/2012  . Left carotid bruit 01/17/2012  . Osteopenia 01/17/2012  . GERD (gastroesophageal reflux disease) 01/17/2012  . Depression 01/17/2012  . Hypertension 12/18/2011  . Hypercholesterolemia 12/18/2011    Past Surgical History  Procedure Laterality Date  . Appendectomy    . Bilateral salpingoophorectomy  2004    complex ovarian cyst (left) - benign  . Ventral hernia repair  2005    Dr. Tamala Julian  . Abdominal hysterectomy  1973    secondary to bleeding  . Mandible fracture surgery    . Breast biopsy Bilateral     x4    Past Surgical History  Procedure Laterality Date  . Appendectomy    . Bilateral salpingoophorectomy  2004    complex ovarian cyst (left) - benign  . Ventral hernia repair  2005    Dr. Tamala Julian  . Abdominal hysterectomy  1973  secondary to bleeding  . Mandible fracture surgery    . Breast biopsy Bilateral     x4    Current Outpatient Rx  Name  Route  Sig  Dispense  Refill  . aspirin 81 MG tablet   Oral   Take 81 mg by mouth daily.         . ciprofloxacin (CIPRO) 500 MG tablet   Oral   Take 1 tablet (500 mg total) by mouth 2 (two) times daily.   20 tablet   0   . clopidogrel (PLAVIX) 75 MG tablet   Oral   Take 1 tablet (75 mg total) by mouth daily.   90 tablet   3   . dicyclomine (BENTYL) 20 MG tablet   Oral   Take 1 tablet (20 mg total) by mouth 3 (three) times daily as needed (abdominal pain).   30 tablet   0   . losartan (COZAAR) 50 MG tablet   Oral    Take 1 tablet (50 mg total) by mouth daily.   90 tablet   1   . lovastatin (MEVACOR) 40 MG tablet   Oral   Take 1 tablet (40 mg total) by mouth at bedtime.   90 tablet   1   . metroNIDAZOLE (FLAGYL) 500 MG tablet   Oral   Take 1 tablet (500 mg total) by mouth 3 (three) times daily.   30 tablet   0   . PARoxetine (PAXIL) 30 MG tablet   Oral   Take 1 tablet (30 mg total) by mouth daily.   30 tablet   2   . Probiotic Product (PROBIOTIC PO)   Oral   Take 1 capsule by mouth daily.         . temazepam (RESTORIL) 15 MG capsule   Oral   Take 1 capsule (15 mg total) by mouth at bedtime.   90 capsule   0     Allergies:  Prednisone; Shrimp; and Vicodin  Family History: Family History  Problem Relation Age of Onset  . Esophageal cancer Mother   . Stomach cancer Mother   . Cirrhosis Brother   . Parkinson's disease Brother   . Ovarian cancer Sister   . Colon cancer Sister     half-sister  . Heart disease Father     Social History: Social History  Substance Use Topics  . Smoking status: Never Smoker   . Smokeless tobacco: Never Used  . Alcohol Use: No     Review of Systems:   10 point review of systems was performed and was otherwise negative:  Constitutional: No fever Eyes: No visual disturbances ENT: No sore throat, ear pain Cardiac: No chest pain Respiratory: No shortness of breath, wheezing, or stridor Abdomen: Abdominal pain is somewhat diffuse and points to both middle quadrant regions, no current vomiting vomiting, dry heaves at home No diarrhea Endocrine: No weight loss, No night sweats Extremities: No peripheral edema, cyanosis Skin: No rashes, easy bruising Neurologic: No focal weakness, trouble with speech or swollowing Urologic: No dysuria, Hematuria, or urinary frequency   Physical Exam:  ED Triage Vitals  Enc Vitals Group     BP --      Pulse --      Resp --      Temp --      Temp src --      SpO2 --      Weight --      Height --  Head Cir --      Peak Flow --      Pain Score --      Pain Loc --      Pain Edu? --      Excl. in [place]? --     General: Awake , Alert , and Oriented times 3; GCS 15 Head: Normal cephalic , atraumatic Eyes: Pupils equal , round, reactive to light Nose/Throat: No nasal drainage, patent upper airway without erythema or exudate.  Neck: Supple, Full range of motion, No anterior adenopathy or palpable thyroid masses Lungs: Clear to ascultation without wheezes , rhonchi, or rales Heart: Regular rate, regular rhythm without murmurs , gallops , or rubs Abdomen: Tender across both medial regions of the abdomen without any rebound guarding or rigidity. Bowel sounds are positive and somewhat hyperactive in all 4 quadrants. There is no obvious abdominal distention. No palpable masses      Extremities: 2 plus symmetric pulses. No edema, clubbing or cyanosis Neurologic: normal ambulation, Motor symmetric without deficits, sensory intact Skin: warm, dry, no rashes     ED Course:  Laboratory work and treatment here at [facility] was offered and the patient and her daughter declined at this point. The daughter plans on driving directly to [facility]. We will assist him with any wishes of that we can offer here from our emergency department. She was given ibuprofen and unfortunately has a Tylenol allergy   Assessment: Acute unspecified exacerbation of chronic abdominal pain  Final Clinical Impression:   Final diagnoses:  Generalized abdominal pain     Plan: * Outpatient management as outlined above           Daymon Larsen, MD 05/02/15 [phone]

## 2015-05-03 NOTE — Telephone Encounter (Signed)
Spoke to Dr Dorothey Baseman office today.  They will call pt this am and see about earlier work in appt.  Pt notified.  Decreased pain today. Still with loose stool.

## 2015-05-09 ENCOUNTER — Other Ambulatory Visit: Payer: Self-pay

## 2015-05-09 DIAGNOSIS — N6019 Diffuse cystic mastopathy of unspecified breast: Secondary | ICD-10-CM | POA: Insufficient documentation

## 2015-05-09 DIAGNOSIS — Z86010 Personal history of colonic polyps: Secondary | ICD-10-CM | POA: Insufficient documentation

## 2015-05-09 DIAGNOSIS — M1990 Unspecified osteoarthritis, unspecified site: Secondary | ICD-10-CM | POA: Insufficient documentation

## 2015-05-09 DIAGNOSIS — G43909 Migraine, unspecified, not intractable, without status migrainosus: Secondary | ICD-10-CM | POA: Insufficient documentation

## 2015-05-10 ENCOUNTER — Encounter: Payer: Self-pay | Admitting: Gastroenterology

## 2015-05-10 ENCOUNTER — Ambulatory Visit (INDEPENDENT_AMBULATORY_CARE_PROVIDER_SITE_OTHER): Payer: Medicare Other | Admitting: Gastroenterology

## 2015-05-10 VITALS — BP 173/72 | HR 76 | Temp 98.1°F | Ht 63.0 in | Wt 144.0 lb

## 2015-05-10 DIAGNOSIS — G8929 Other chronic pain: Secondary | ICD-10-CM | POA: Diagnosis not present

## 2015-05-10 DIAGNOSIS — R1032 Left lower quadrant pain: Secondary | ICD-10-CM

## 2015-05-10 DIAGNOSIS — R1031 Right lower quadrant pain: Secondary | ICD-10-CM | POA: Diagnosis not present

## 2015-05-10 NOTE — Progress Notes (Signed)
Gastroenterology Consultation  Referring Provider:     Einar Pheasant, MD Primary Care Physician:  Einar Pheasant, MD Primary Gastroenterologist:  Dr. Allen Norris     Reason for Consultation:     Abdominal pain        HPI:   Tamara [name] is a 82 y.o. y/o female referred for consultation & management of Abdominal pain by Dr. Einar Pheasant, MD.  This patient comes today with a history of having multiple colonoscopies in the past. The patient's original colonoscopy approximate 7 years ago showed her to possibly have chronic colitis. Subsequent colonoscopies with biopsies did not show any signs of ulcerative colitis. The patient had a note of a sigmoid diverticulosis with a stricture. The patient states that she had been sick recently with severe abdominal pain both in the right lower and left lower abdomen. The patient also reports that she was having profuse diarrhea. The patient started to take a probiotic with a laxative in the morning and yogurt and she states that her symptoms have completely resolved. She is now feeling well except for some mild lower abdominal pain with palpation. She states she walks every day and has been staying active. She is now moving her bowels every day. The patient states that when she was having the pain after she would move her bowels she felt much better. There is no report of any unexplained weight loss fevers chills nausea or vomiting. The patient had a CT scan in Rx that showed colonic diverticulosis without any evidence of diverticulitis or any other acute findings.  Past Medical History  Diagnosis Date  . Hypertension   . Hypercholesterolemia   . GERD (gastroesophageal reflux disease)     EGD - gastritis/mild duodenitis, previous positive H. pylori  . Tubular adenoma of colon   . Fibrocystic breast disease   . Degenerative joint disease   . Osteopenia   . History of depression   . History of skin cancer     facial  . Vitamin D deficiency   . Chicken  pox   . Glaucoma   . Migraines   . Diverticulitis   . Anemia   . Helicobacter pylori ab+   . Colitis   . Internal hemorrhoids   . Arthritis   . IBS (irritable bowel syndrome)   . Stroke ([place])   . Heart murmur   . Diverticulosis     Past Surgical History  Procedure Laterality Date  . Appendectomy    . Bilateral salpingoophorectomy  2004    complex ovarian cyst (left) - benign  . Ventral hernia repair  2005    Dr. Tamala Julian  . Abdominal hysterectomy  1973    secondary to bleeding  . Mandible fracture surgery    . Breast biopsy Bilateral     x4    Prior to Admission medications   Medication Sig Start Date End Date Taking? Authorizing Provider  aspirin 81 MG tablet Take 81 mg by mouth daily.   Yes Historical Provider, MD  clopidogrel (PLAVIX) 75 MG tablet Take 1 tablet (75 mg total) by mouth daily. 02/16/14  Yes Minna Merritts, MD  losartan (COZAAR) 50 MG tablet Take 1 tablet (50 mg total) by mouth daily. 04/25/15  Yes Einar Pheasant, MD  lovastatin (MEVACOR) 40 MG tablet Take 1 tablet (40 mg total) by mouth at bedtime. 04/25/15  Yes Einar Pheasant, MD  PARoxetine (PAXIL) 30 MG tablet Take 1 tablet (30 mg total) by mouth daily. 02/13/15  Yes  Einar Pheasant, MD  Probiotic Product (PROBIOTIC PO) Take 1 capsule by mouth daily.   Yes Historical Provider, MD  temazepam (RESTORIL) 15 MG capsule Take 1 capsule (15 mg total) by mouth at bedtime. 04/25/15  Yes Einar Pheasant, MD  dicyclomine (BENTYL) 20 MG tablet Reported on 05/10/2015 04/28/15   Historical Provider, MD  ondansetron (ZOFRAN) 4 MG tablet Take 1 tablet (4 mg total) by mouth every 6 (six) hours. Patient not taking: Reported on 05/10/2015 05/02/15   Orpah Greek, MD  oxyCODONE-acetaminophen (PERCOCET) 5-325 MG tablet Take 0.5 tablets by mouth every 4 (four) hours as needed. Patient not taking: Reported on 05/10/2015 05/02/15   Orpah Greek, MD    Family History  Problem Relation Age of Onset  . Esophageal cancer  Mother   . Stomach cancer Mother   . Cirrhosis Brother   . Parkinson's disease Brother   . Ovarian cancer Sister   . Colon cancer Sister     half-sister  . Heart disease Father      Social History  Substance Use Topics  . Smoking status: Never Smoker   . Smokeless tobacco: Never Used  . Alcohol Use: No    Allergies as of 05/10/2015 - Review Complete 05/10/2015  Allergen Reaction Noted  . Flagyl [metronidazole]  05/02/2015  . Other  05/02/2015  . Prednisone Other (See Comments) 04/27/2015  . Shrimp [shellfish allergy]  10/12/2012  . Vicodin [hydrocodone-acetaminophen] Nausea And Vomiting 12/18/2011    Review of Systems:    All systems reviewed and negative except where noted in HPI.   Physical Exam:  BP 173/72 mmHg  Pulse 76  Temp(Src) 98.1 F (36.7 C) (Oral)  Ht 5\' 3"  (1.6 m)  Wt 144 lb (65.318 kg)  BMI 25.51 kg/m2  LMP 01/17/1972 Patient's last menstrual period was 01/17/1972. Psych:  Alert and cooperative. Normal mood and affect. General:   Alert,  Well-developed, well-nourished, pleasant and cooperative in NAD Head:  Normocephalic and atraumatic. Eyes:  Sclera clear, no icterus.   Conjunctiva pink. Ears:  Normal auditory acuity. Nose:  No deformity, discharge, or lesions. Mouth:  No deformity or lesions,oropharynx pink & moist. Neck:  Supple; no masses or thyromegaly. Lungs:  Respirations even and unlabored.  Clear throughout to auscultation.   No wheezes, crackles, or rhonchi. No acute distress. Heart:  Regular rate and rhythm; no murmurs, clicks, rubs, or gallops. Abdomen:  Normal bowel sounds.  No bruits.  Soft, mild tenderness on the right and left lower quadrant of the abdomen and non-distended without masses, hepatosplenomegaly or hernias noted.  No guarding or rebound tenderness.  Negative Carnett sign.   Rectal:  Deferred.  Msk:  Symmetrical without gross deformities.  Good, equal movement & strength bilaterally. Pulses:  Normal pulses  noted. Extremities:  No clubbing or edema.  No cyanosis. Neurologic:  Alert and oriented x3;  grossly normal neurologically. Skin:  Intact without significant lesions or rashes.  No jaundice. Lymph Nodes:  No significant cervical adenopathy. Psych:  Alert and cooperative. Normal mood and affect.  Imaging Studies: Ct Abdomen Pelvis W Contrast  04/26/2015  CLINICAL DATA:  Abdominal pain, bloating, and diarrhea for 1 month. Intermittent nausea. 5 pound weight loss during past month. Previous appendectomy. EXAM: CT ABDOMEN AND PELVIS WITH CONTRAST TECHNIQUE: Multidetector CT imaging of the abdomen and pelvis was performed using the standard protocol following bolus administration of intravenous contrast. CONTRAST:  124mL ISOVUE-300 IOPAMIDOL (ISOVUE-300) INJECTION 61% COMPARISON:  03/30/2008 FINDINGS: Lower chest:  No acute findings.  Hepatobiliary: No masses or other significant abnormality. Gallbladder is unremarkable. Pancreas: No mass, inflammatory changes, or other significant abnormality. Spleen: Within normal limits in size and appearance. Adrenals/Urinary Tract: No masses identified. No evidence of hydronephrosis. Stomach/Bowel: No evidence of obstruction, inflammatory process, or abnormal fluid collections. Diverticulosis is again seen involving the descending and proximal sigmoid colon, however there is no evidence of diverticulitis. Vascular/Lymphatic: No pathologically enlarged lymph nodes. No evidence of abdominal aortic aneurysm. Aortic atherosclerotic plaque noted. Reproductive: Prior hysterectomy noted. Adnexal regions are unremarkable in appearance. Other: None. Musculoskeletal:  No suspicious bone lesions identified. IMPRESSION: Colonic diverticulosis. No radiographic evidence of diverticulitis or other acute findings. Electronically Signed   By: Earle Gell M.D.   On: 04/26/2015 12:27   Dg Abd Acute W/chest  04/27/2015  CLINICAL DATA:  Subacute onset of generalized abdominal pain and  diarrhea. Abdominal distention. Initial encounter. EXAM: DG ABDOMEN ACUTE W/ 1V CHEST COMPARISON:  CT of the abdomen and pelvis performed 04/26/2015, and chest radiograph performed 09/21/2012 FINDINGS: The lungs are well-aerated and clear. There is mild elevation of the right hemidiaphragm. Mild peribronchial thickening is noted. There is no evidence of focal opacification, pleural effusion or pneumothorax. The cardiomediastinal silhouette is within normal limits. The visualized bowel gas pattern is unremarkable. Scattered contrast is noted within the colon; there is no evidence of small bowel dilatation to suggest obstruction. No free intra-abdominal air is identified on the provided upright view. No acute osseous abnormalities are seen; the sacroiliac joints are unremarkable in appearance. IMPRESSION: 1. Contrast noted filling the colon. Unremarkable bowel gas pattern; no free intra-abdominal air seen. 2. Mild elevation the right hemidiaphragm. Mild peribronchial thickening noted. Lungs otherwise grossly clear. Electronically Signed   By: Garald Balding M.D.   On: 04/27/2015 22:23    Assessment and Plan:   Tamara [name] is a 82 y.o. y/o female who comes in today with abdominal pain that has resolved. The patient states she has been doing well for the last week or so. The patient attributes this to moving her bowels more frequently. The patient has had multiple colonoscopies over the years without any polyps or lesions found except for sigmoid stricture and diverticulosis. I do not think the patient needs a repeat colonoscopy at this time. The patient has been instructed to continue what she is doing and she has also been instructed that if she does not continue to improve or her symptoms returned that she should contact me. The patient and her daughter have been explained the plan and agree with it.   Note: This dictation was prepared with Dragon dictation along with smaller phrase technology. Any  transcriptional errors that result from this process are unintentional.

## 2015-05-15 ENCOUNTER — Ambulatory Visit (INDEPENDENT_AMBULATORY_CARE_PROVIDER_SITE_OTHER): Payer: Medicare Other | Admitting: Cardiovascular Disease

## 2015-05-15 ENCOUNTER — Encounter: Payer: Self-pay | Admitting: Cardiovascular Disease

## 2015-05-15 VITALS — BP 140/70 | HR 71 | Ht 64.0 in | Wt 142.2 lb

## 2015-05-15 DIAGNOSIS — I2510 Atherosclerotic heart disease of native coronary artery without angina pectoris: Secondary | ICD-10-CM | POA: Diagnosis not present

## 2015-05-15 DIAGNOSIS — R011 Cardiac murmur, unspecified: Secondary | ICD-10-CM | POA: Diagnosis not present

## 2015-05-15 DIAGNOSIS — I6523 Occlusion and stenosis of bilateral carotid arteries: Secondary | ICD-10-CM | POA: Diagnosis not present

## 2015-05-15 DIAGNOSIS — I159 Secondary hypertension, unspecified: Secondary | ICD-10-CM | POA: Diagnosis not present

## 2015-05-15 DIAGNOSIS — E7800 Pure hypercholesterolemia, unspecified: Secondary | ICD-10-CM

## 2015-05-15 DIAGNOSIS — I700 Atherosclerosis of aorta: Secondary | ICD-10-CM

## 2015-05-15 MED ORDER — ROSUVASTATIN CALCIUM 40 MG PO TABS: 40.0000 mg | ORAL_TABLET | Freq: Every day | ORAL | Status: DC

## 2015-05-15 MED ORDER — CLOPIDOGREL BISULFATE 75 MG PO TABS: 75.0000 mg | ORAL_TABLET | Freq: Every day | ORAL | Status: DC

## 2015-05-15 NOTE — Assessment & Plan Note (Signed)
Previously unable to do afford Crestor We did go to www.goodrx.com, printed a coupon for her to take to [facility] in [place] Crestor will be $15 per month for 40 mg pill Suggested she stop the lovastatin Suggested repeat lab work in 2 or 3 months, if cholesterol continues to run high, we could start zetia (which will be generic at that time)

## 2015-05-15 NOTE — Assessment & Plan Note (Signed)
Seen on CT scan, notably in the LAD, RCA Left circumflex not well visualized She denies any symptoms concerning for angina

## 2015-05-15 NOTE — Patient Instructions (Addendum)
You are doing well.  Please stop the lovastatin Start crestor 40 mg once a day  If cholesterol continues to run high, We will add zetia in July  Please call us if you have new issues that need to be addressed before your next appt.  Your physician wants you to follow-up in: 6 months.  You will receive a reminder letter in the mail two months in advance. If you don't receive a letter, please call our office to schedule the follow-up appointment.

## 2015-05-15 NOTE — Assessment & Plan Note (Signed)
Blood pressure is well controlled on today's visit. No changes made to the medications. 

## 2015-05-15 NOTE — Progress Notes (Signed)
Patient ID: Tamara [name], female    DOB: 1927-12-11, 82 y.o.   MRN: [id number]  HPI Comments: Tamara [name] is a very pleasant 82 year old woman with hyperlipidemia, hypertension,  Previous Stroke August 2014 . Mild carotid plaque bilaterally seen Depression She presents today for follow-up of her blood pressure and history of stroke   in follow-up, she reports that she has had a difficult time secondary to GI issues She had diverticulitis, went to the emergency room twice, was given Cipro and Flagyl Family reports that she had a reaction to the Flagyl Now feels back to normal Active, no regular exercise program  CT scan of the abdomen done from the emergency room reviewed with her details, images pulled up in the office. This showed mild to moderate diffuse aorta and coronary plaquing/atherosclerosis.  She is tolerating lovastatin, dose recently increased up to 40 mg daily for total cholesterol 260.  EKG on today's visit shows normal sinus rhythm with rate 71 bpm, no significant ST or T-wave changes  Other past medical history  admitted to the hospital August 25 and discharged 09/22/2012. Diagnosis of embolic infarct in the left parietal area with right hand paresthesias and expressive aphasia.  no documented atrial fibrillation though significant concern given history of fluttering and palpitations.  30 day Holter did not show any significant arrhythmia  residual deficits have returned to normal.  On aspirin and Plavix with no further symptoms  Echocardiogram 09/22/2012 shows ejection fraction 55-60% mild mild MR and TR, normal right ventricular systolic pressures bubble study was not performed to look for PFO EKG in the hospital showed normal sinus rhythm with rate 61 beats per minute, essentially normal MRI of the brain showed abnormal signal in the left high posterior parietal region consistent with acute ischemia Carotid ultrasound showed no hemodynamic significance. There is  calcified mural plaque identified on the right and left, less than 50%   Allergies  Allergen Reactions  . Flagyl [Metronidazole]     Throat burning   . Other     Steroids "all steroids causes burning in esophagus and stomach"  . Prednisone Other (See Comments)    inflammed esophagus  . Shrimp [Shellfish Allergy]     "sick on stomach"  . Vicodin [Hydrocodone-Acetaminophen] Nausea And Vomiting    Outpatient Encounter Prescriptions as of 05/15/2015  Medication Sig  . aspirin 81 MG tablet Take 81 mg by mouth daily.  . clopidogrel (PLAVIX) 75 MG tablet Take 1 tablet (75 mg total) by mouth daily.  [name] dicyclomine (BENTYL) 20 MG tablet Reported on 05/10/2015  . losartan (COZAAR) 50 MG tablet Take 1 tablet (50 mg total) by mouth daily.  . ondansetron (ZOFRAN) 4 MG tablet Take 1 tablet (4 mg total) by mouth every 6 (six) hours.  [name] oxyCODONE-acetaminophen (PERCOCET) 5-325 MG tablet Take 0.5 tablets by mouth every 4 (four) hours as needed.  [name] PARoxetine (PAXIL) 30 MG tablet Take 1 tablet (30 mg total) by mouth daily.  . Probiotic Product (PROBIOTIC PO) Take 1 capsule by mouth daily.  . temazepam (RESTORIL) 15 MG capsule Take 1 capsule (15 mg total) by mouth at bedtime.  . [DISCONTINUED] clopidogrel (PLAVIX) 75 MG tablet Take 1 tablet (75 mg total) by mouth daily.  . [DISCONTINUED] clopidogrel (PLAVIX) 75 MG tablet Take 1 tablet (75 mg total) by mouth daily.  . [DISCONTINUED] lovastatin (MEVACOR) 40 MG tablet Take 1 tablet (40 mg total) by mouth at bedtime.  . rosuvastatin (CRESTOR) 40 MG tablet Take 1 tablet (  40 mg total) by mouth daily.   No facility-administered encounter medications on file as of 05/15/2015.    Past Medical History  Diagnosis Date  . Hypertension   . Hypercholesterolemia   . GERD (gastroesophageal reflux disease)     EGD - gastritis/mild duodenitis, previous positive H. pylori  . Tubular adenoma of colon   . Fibrocystic breast disease   . Degenerative joint disease    . Osteopenia   . History of depression   . History of skin cancer     facial  . Vitamin D deficiency   . Chicken pox   . Glaucoma   . Migraines   . Diverticulitis   . Anemia   . Helicobacter pylori ab+   . Colitis   . Internal hemorrhoids   . Arthritis   . IBS (irritable bowel syndrome)   . Stroke ([place])   . Heart murmur   . Diverticulosis     Past Surgical History  Procedure Laterality Date  . Appendectomy    . Bilateral salpingoophorectomy  2004    complex ovarian cyst (left) - benign  . Ventral hernia repair  2005    Dr. Tamala Julian  . Abdominal hysterectomy  1973    secondary to bleeding  . Mandible fracture surgery    . Breast biopsy Bilateral     x4    Social History  reports that she has never smoked. She has never used smokeless tobacco. She reports that she does not drink alcohol or use illicit drugs.  Family History family history includes Cirrhosis in her brother; Colon cancer in her sister; Esophageal cancer in her mother; Heart disease in her father; Ovarian cancer in her sister; Parkinson's disease in her brother; Stomach cancer in her mother.   Review of Systems  Constitutional: Negative.   Respiratory: Negative.   Cardiovascular: Negative.   Gastrointestinal: Negative.   Musculoskeletal: Negative.   Neurological: Negative.   Hematological: Negative.   Psychiatric/Behavioral: Negative.   All other systems reviewed and are negative.   BP 140/70 mmHg  Pulse 71  Ht 5\' 4"  (1.626 m)  Wt 142 lb 4 oz (64.524 kg)  BMI 24.41 kg/m2  SpO2 97%  LMP 01/17/1972  Physical Exam  Constitutional: She is oriented to person, place, and time. She appears well-developed and well-nourished.  HENT:  Head: Normocephalic.  Nose: Nose normal.  Mouth/Throat: Oropharynx is clear and moist.  Eyes: Conjunctivae are normal. Pupils are equal, round, and reactive to light.  Neck: Normal range of motion. Neck supple. No JVD present.  Cardiovascular: Normal rate, regular  rhythm, S1 normal, S2 normal and intact distal pulses.  Exam reveals no gallop and no friction rub.   Murmur heard.  Systolic murmur is present with a grade of 2/6  Pulmonary/Chest: Effort normal and breath sounds normal. No respiratory distress. She has no wheezes. She has no rales. She exhibits no tenderness.  Abdominal: Soft. Bowel sounds are normal. She exhibits no distension. There is no tenderness.  Musculoskeletal: Normal range of motion. She exhibits no edema or tenderness.  Lymphadenopathy:    She has no cervical adenopathy.  Neurological: She is alert and oriented to person, place, and time. Coordination normal.  Skin: Skin is warm and dry. No rash noted. No erythema.  Psychiatric: She has a normal mood and affect. Her behavior is normal. Judgment and thought content normal.    Assessment and Plan  Nursing note and vitals reviewed.

## 2015-05-15 NOTE — Assessment & Plan Note (Signed)
Mild to moderate disease seen on CT scan, Discussed lipid management with her.

## 2015-05-17 ENCOUNTER — Ambulatory Visit: Payer: Self-pay | Admitting: Gastroenterology

## 2015-05-24 ENCOUNTER — Other Ambulatory Visit: Payer: Self-pay | Admitting: Internal Medicine

## 2015-05-30 ENCOUNTER — Ambulatory Visit: Payer: Self-pay | Admitting: Gastroenterology

## 2015-05-31 ENCOUNTER — Encounter: Payer: Self-pay | Admitting: Internal Medicine

## 2015-05-31 ENCOUNTER — Ambulatory Visit (INDEPENDENT_AMBULATORY_CARE_PROVIDER_SITE_OTHER): Payer: Medicare Other | Admitting: Internal Medicine

## 2015-05-31 VITALS — BP 110/70 | HR 75 | Temp 98.2°F | Resp 18 | Ht 64.0 in | Wt 143.0 lb

## 2015-05-31 DIAGNOSIS — R208 Other disturbances of skin sensation: Secondary | ICD-10-CM

## 2015-05-31 DIAGNOSIS — R202 Paresthesia of skin: Secondary | ICD-10-CM

## 2015-05-31 DIAGNOSIS — I2510 Atherosclerotic heart disease of native coronary artery without angina pectoris: Secondary | ICD-10-CM

## 2015-05-31 DIAGNOSIS — D649 Anemia, unspecified: Secondary | ICD-10-CM

## 2015-05-31 DIAGNOSIS — I639 Cerebral infarction, unspecified: Secondary | ICD-10-CM

## 2015-05-31 DIAGNOSIS — R739 Hyperglycemia, unspecified: Secondary | ICD-10-CM

## 2015-05-31 DIAGNOSIS — I159 Secondary hypertension, unspecified: Secondary | ICD-10-CM

## 2015-05-31 DIAGNOSIS — I700 Atherosclerosis of aorta: Secondary | ICD-10-CM

## 2015-05-31 DIAGNOSIS — F32A Depression, unspecified: Secondary | ICD-10-CM

## 2015-05-31 DIAGNOSIS — F329 Major depressive disorder, single episode, unspecified: Secondary | ICD-10-CM

## 2015-05-31 DIAGNOSIS — I6523 Occlusion and stenosis of bilateral carotid arteries: Secondary | ICD-10-CM | POA: Diagnosis not present

## 2015-05-31 DIAGNOSIS — R1084 Generalized abdominal pain: Secondary | ICD-10-CM

## 2015-05-31 DIAGNOSIS — E785 Hyperlipidemia, unspecified: Secondary | ICD-10-CM

## 2015-05-31 DIAGNOSIS — R200 Anesthesia of skin: Secondary | ICD-10-CM | POA: Insufficient documentation

## 2015-05-31 MED ORDER — PAROXETINE HCL 30 MG PO TABS: 30.0000 mg | ORAL_TABLET | Freq: Every day | ORAL | Status: DC

## 2015-05-31 NOTE — Progress Notes (Signed)
Patient ID: Tamara [name], female   DOB: 11-Jun-1927, 82 y.o.   MRN: [id number]   Subjective:    Patient ID: Tamara [name], female    DOB: 1927-03-16, 82 y.o.   MRN: [id number]  HPI  Patient here for a scheduled follow up.  She is accompanied by her daughter.  History obtained from both of them.  She saw Dr Allen Norris for her abdominal discomfort.  See his note for details.  Is doing better.  Pain is better.  Bowels moving.  Eating and drinking.  No nausea or vomiting.  Taking a probiotic, eating activia yogurt and taking stool softeners.  She feels this regimen is working well for her.  Her daughter reported that she had an episode a few weeks ago, where she noticed Ms Berberich's hand shaking.  Right hand.  Ms Cookston reports at that time, she noticed some right hand and arm numbness - up to mid arm.  The episode lasted approximately 30 minutes.  She was off her plavix at the time.  Is back on plavix now.  Has had no other episodes.     Past Medical History  Diagnosis Date  . Hypertension   . Hypercholesterolemia   . GERD (gastroesophageal reflux disease)     EGD - gastritis/mild duodenitis, previous positive H. pylori  . Tubular adenoma of colon   . Fibrocystic breast disease   . Degenerative joint disease   . Osteopenia   . History of depression   . History of skin cancer     facial  . Vitamin D deficiency   . Chicken pox   . Glaucoma   . Migraines   . Diverticulitis   . Anemia   . Helicobacter pylori ab+   . Colitis   . Internal hemorrhoids   . Arthritis   . IBS (irritable bowel syndrome)   . Stroke ([place])   . Heart murmur   . Diverticulosis    Past Surgical History  Procedure Laterality Date  . Appendectomy    . Bilateral salpingoophorectomy  2004    complex ovarian cyst (left) - benign  . Ventral hernia repair  2005    Dr. Tamala Julian  . Abdominal hysterectomy  1973    secondary to bleeding  . Mandible fracture surgery    . Breast biopsy Bilateral     x4   Family History    Problem Relation Age of Onset  . Esophageal cancer Mother   . Stomach cancer Mother   . Cirrhosis Brother   . Parkinson's disease Brother   . Ovarian cancer Sister   . Colon cancer Sister     half-sister  . Heart disease Father    Social History   Social History  . Marital Status: Widowed    Spouse Name: N/A  . Number of Children: 2  . Years of Education: N/A   Occupational History  . retired    Social History Main Topics  . Smoking status: Never Smoker   . Smokeless tobacco: Never Used  . Alcohol Use: No  . Drug Use: No  . Sexual Activity: Not Asked   Other Topics Concern  . None   Social History Narrative    Outpatient Encounter Prescriptions as of 05/31/2015  Medication Sig  . aspirin 81 MG tablet Take 81 mg by mouth daily.  . clopidogrel (PLAVIX) 75 MG tablet Take 1 tablet (75 mg total) by mouth daily.  [name] losartan (COZAAR) 50 MG tablet Take 1 tablet (50 mg total)  by mouth daily.  [name] PARoxetine (PAXIL) 30 MG tablet Take 1 tablet (30 mg total) by mouth daily.  . Probiotic Product (PROBIOTIC PO) Take 1 capsule by mouth daily.  . rosuvastatin (CRESTOR) 40 MG tablet Take 1 tablet (40 mg total) by mouth daily.  . temazepam (RESTORIL) 15 MG capsule Take 1 capsule (15 mg total) by mouth at bedtime.  . [DISCONTINUED] dicyclomine (BENTYL) 20 MG tablet Reported on 05/10/2015  . [DISCONTINUED] losartan (COZAAR) 50 MG tablet Take 1 tablet (50 mg total) by mouth daily.  . [DISCONTINUED] ondansetron (ZOFRAN) 4 MG tablet Take 1 tablet (4 mg total) by mouth every 6 (six) hours.  . [DISCONTINUED] oxyCODONE-acetaminophen (PERCOCET) 5-325 MG tablet Take 0.5 tablets by mouth every 4 (four) hours as needed.  . [DISCONTINUED] PARoxetine (PAXIL) 30 MG tablet Take 1 tablet (30 mg total) by mouth daily.   No facility-administered encounter medications on file as of 05/31/2015.    Review of Systems  Constitutional: Negative for appetite change and unexpected weight change.  HENT: Negative  for congestion and sinus pressure.   Respiratory: Negative for cough, chest tightness and shortness of breath.   Cardiovascular: Negative for chest pain, palpitations and leg swelling.  Gastrointestinal: Negative for nausea, vomiting, abdominal pain and diarrhea.  Genitourinary: Negative for dysuria and difficulty urinating.  Musculoskeletal: Negative for back pain and joint swelling.  Skin: Negative for color change and rash.  Neurological: Positive for tremors. Negative for dizziness, light-headedness and headaches.       Right arm numbness as outlined.   Psychiatric/Behavioral: Negative for dysphoric mood and agitation.       Objective:     Blood pressure rechecked by me:  142/78  Physical Exam  Constitutional: She appears well-developed and well-nourished. No distress.  HENT:  Nose: Nose normal.  Mouth/Throat: Oropharynx is clear and moist.  Neck: Neck supple. No thyromegaly present.  Cardiovascular: Normal rate and regular rhythm.   Pulmonary/Chest: Breath sounds normal. No respiratory distress. She has no wheezes.  Abdominal: Soft. Bowel sounds are normal. There is no tenderness.  Musculoskeletal: She exhibits no edema or tenderness.  Lymphadenopathy:    She has no cervical adenopathy.  Skin: No rash noted. No erythema.  Psychiatric: She has a normal mood and affect. Her behavior is normal.    BP 110/70 mmHg  Pulse 75  Temp(Src) 98.2 F (36.8 C) (Oral)  Resp 18  Ht '5\' 4"'$  (1.626 m)  Wt 143 lb (64.864 kg)  BMI 24.53 kg/m2  SpO2 95%  LMP 01/17/1972 Wt Readings from Last 3 Encounters:  05/31/15 143 lb (64.864 kg)  05/15/15 142 lb 4 oz (64.524 kg)  05/10/15 144 lb (65.318 kg)     Lab Results  Component Value Date   WBC 5.5 05/02/2015   HGB 11.0* 05/02/2015   HCT 33.0* 05/02/2015   PLT 338 05/02/2015   GLUCOSE 94 05/02/2015   CHOL 258* 04/21/2015   TRIG 108.0 04/21/2015   HDL 63.20 04/21/2015   LDLDIRECT 177.7 01/19/2013   LDLCALC 173* 04/21/2015   ALT  10* 05/02/2015   AST 18 05/02/2015   NA 141 05/02/2015   K 3.7 05/02/2015   CL 106 05/02/2015   CREATININE 0.85 05/02/2015   BUN 12 05/02/2015   CO2 24 05/02/2015   TSH 3.69 04/21/2015   HGBA1C 6.1 04/21/2015       Assessment & Plan:   Problem List Items Addressed This Visit    Abdominal pain    Saw Dr Allen Norris.  W/up  as outlined previously.  Is better.  Follow.        Aortic atherosclerosis ([place])    Noted on CT scan.  Back on crestor.       Relevant Medications   losartan (COZAAR) 50 MG tablet   CAD (coronary artery disease)    Seen on recent CT scan - notably in LAD, RCA.  No chest pain.  Back on crestor now.  Continue risk factor modification.       Relevant Medications   losartan (COZAAR) 50 MG tablet   Carotid stenosis    Obtain carotid ultrasound as outlined.  Continue antiplatelet therapy.       Relevant Medications   losartan (COZAAR) 50 MG tablet   CVA (cerebral vascular accident) ([place])    Previous CVA.  Has been on aspirin and plavix.  Had the episode as outlined.  Arm numbness and "tremor" as outlined.  Was off plavix.  Back on now.  No reoccurring episodes since back on plavix.  Discussed with her today.  Discussed the importance of taking her medications regularly and not skipping doses or running out of medications.  Discussed further w/up.  Obtain a carotid ultrasound.  Discussed MRI.  Wants to check insurance.        Relevant Medications   losartan (COZAAR) 50 MG tablet   Other Relevant Orders   US Carotid Duplex Bilateral   Depression    On paxil.  Stable.        Relevant Medications   PARoxetine (PAXIL) 30 MG tablet   Hyperglycemia    Low carb diet and exercise.  Follow met b and a1c.       Relevant Orders   Hemoglobin A1c   Hyperlipidemia    Back on crestor.  Low cholesterol diet and exercise.  Follow lipid panel and liver function tests.       Relevant Medications   losartan (COZAAR) 50 MG tablet   Other Relevant Orders   Lipid panel    Hepatic function panel   Hypertension    Blood pressure as outlined.  Has been under good control.  Continue same medication regimen.  Follow pressures.  Follow metabolic panel.        Relevant Medications   losartan (COZAAR) 50 MG tablet   Other Relevant Orders   Basic metabolic panel   Right arm numbness - Primary    Symptoms as outlined.  Was off plavix at the time.  No neuro deficit noted on exam.  Continue aspirin and plavix.  Obtain carotid ultrasound.  Daughter will check insurance regarding MRI.        Other Visit Diagnoses    Anemia, unspecified anemia type        Relevant Orders    CBC with Differential/Platelet    Ferritin    IBC panel        Einar Pheasant, MD

## 2015-05-31 NOTE — Progress Notes (Signed)
Pre-visit discussion using our clinic review tool. No additional management support is needed unless otherwise documented below in the visit note.  

## 2015-06-01 ENCOUNTER — Encounter: Payer: Self-pay | Admitting: Internal Medicine

## 2015-06-01 MED ORDER — LOSARTAN POTASSIUM 50 MG PO TABS: 50.0000 mg | ORAL_TABLET | Freq: Every day | ORAL | Status: DC

## 2015-06-01 NOTE — Assessment & Plan Note (Signed)
Previous CVA.  Has been on aspirin and plavix.  Had the episode as outlined.  Arm numbness and "tremor" as outlined.  Was off plavix.  Back on now.  No reoccurring episodes since back on plavix.  Discussed with her today.  Discussed the importance of taking her medications regularly and not skipping doses or running out of medications.  Discussed further w/up.  Obtain a carotid ultrasound.  Discussed MRI.  Wants to check insurance.

## 2015-06-01 NOTE — Assessment & Plan Note (Signed)
On paxil.  Stable.  

## 2015-06-01 NOTE — Assessment & Plan Note (Signed)
Saw Dr Allen Norris.  W/up as outlined previously.  Is better.  Follow.

## 2015-06-01 NOTE — Assessment & Plan Note (Signed)
Blood pressure as outlined.  Has been under good control.  Continue same medication regimen.  Follow pressures.  Follow metabolic panel.

## 2015-06-01 NOTE — Assessment & Plan Note (Signed)
Low carb diet and exercise.  Follow met b and a1c.  

## 2015-06-01 NOTE — Assessment & Plan Note (Signed)
Back on crestor.  Low cholesterol diet and exercise.  Follow lipid panel and liver function tests.   

## 2015-06-01 NOTE — Assessment & Plan Note (Signed)
Noted on CT scan.  Back on crestor.

## 2015-06-01 NOTE — Assessment & Plan Note (Signed)
Obtain carotid ultrasound as outlined.  Continue antiplatelet therapy.

## 2015-06-01 NOTE — Assessment & Plan Note (Signed)
Symptoms as outlined.  Was off plavix at the time.  No neuro deficit noted on exam.  Continue aspirin and plavix.  Obtain carotid ultrasound.  Daughter will check insurance regarding MRI.

## 2015-06-01 NOTE — Assessment & Plan Note (Signed)
Seen on recent CT scan - notably in LAD, RCA.  No chest pain.  Back on crestor now.  Continue risk factor modification.

## 2015-06-05 ENCOUNTER — Telehealth: Payer: Self-pay | Admitting: Cardiovascular Disease

## 2015-06-05 NOTE — Telephone Encounter (Signed)
Pt saw Dr. Rockey Situ on 05/15/15, advised to come back in 6 mos.  Dr. Nicki Reaper placed order for u/s, so she will get results.

## 2015-06-05 NOTE — Telephone Encounter (Signed)
Dr. Einar Pheasant sent a referral for patient to see Dr. Rockey Situ. Patient states that she doesn't need to see Dr. Rockey Situ but  Dr. Nicki Reaper wants her to have a carotid u/s. Patient not sure what to do and would like for you to call her.

## 2015-06-12 ENCOUNTER — Other Ambulatory Visit: Payer: Self-pay | Admitting: Internal Medicine

## 2015-06-12 DIAGNOSIS — R200 Anesthesia of skin: Secondary | ICD-10-CM

## 2015-06-20 ENCOUNTER — Other Ambulatory Visit: Payer: Self-pay | Admitting: Internal Medicine

## 2015-06-20 ENCOUNTER — Ambulatory Visit: Payer: Medicare Other

## 2015-06-20 DIAGNOSIS — R0989 Other specified symptoms and signs involving the circulatory and respiratory systems: Secondary | ICD-10-CM

## 2015-06-20 DIAGNOSIS — R208 Other disturbances of skin sensation: Secondary | ICD-10-CM | POA: Diagnosis not present

## 2015-06-20 DIAGNOSIS — R200 Anesthesia of skin: Secondary | ICD-10-CM

## 2015-06-21 ENCOUNTER — Telehealth: Payer: Self-pay

## 2015-06-21 NOTE — Telephone Encounter (Signed)
-----   Message from Einar Pheasant, MD sent at 06/21/2015  5:12 AM EDT ----- Notify pt that her carotid ultrasound revealed no significant stenosis.

## 2015-06-21 NOTE — Telephone Encounter (Signed)
Patient aware of normal carotid ultrasound results.

## 2015-08-03 ENCOUNTER — Other Ambulatory Visit: Payer: Self-pay | Admitting: Internal Medicine

## 2015-08-03 NOTE — Telephone Encounter (Signed)
Please advise refill, thanks 

## 2015-08-03 NOTE — Telephone Encounter (Signed)
Pt called to follow up on her Rx. Pt would like a call once it's sent in. Thank you!

## 2015-08-03 NOTE — Telephone Encounter (Signed)
Pt called about needing a refill for temazepam (RESTORIL) 15 MG capsule. Pt would like a call once it's ready. Please.  Pharmacy is [facility] [address] [place]  Call pt @ [phone]. Thank you!

## 2015-08-04 MED ORDER — TEMAZEPAM 15 MG PO CAPS: 15.0000 mg | ORAL_CAPSULE | Freq: Every day | ORAL | Status: DC

## 2015-08-04 NOTE — Telephone Encounter (Signed)
Notified patient that Rx was sent. thanks

## 2015-08-04 NOTE — Telephone Encounter (Signed)
Faxed to pharmacy

## 2015-08-04 NOTE — Telephone Encounter (Signed)
ok'd refill for temazepam #90 with no refills.

## 2015-08-29 ENCOUNTER — Other Ambulatory Visit (INDEPENDENT_AMBULATORY_CARE_PROVIDER_SITE_OTHER): Payer: Medicare Other

## 2015-08-29 DIAGNOSIS — R739 Hyperglycemia, unspecified: Secondary | ICD-10-CM | POA: Diagnosis not present

## 2015-08-29 DIAGNOSIS — D649 Anemia, unspecified: Secondary | ICD-10-CM | POA: Diagnosis not present

## 2015-08-29 DIAGNOSIS — E785 Hyperlipidemia, unspecified: Secondary | ICD-10-CM

## 2015-08-29 DIAGNOSIS — R208 Other disturbances of skin sensation: Secondary | ICD-10-CM | POA: Diagnosis not present

## 2015-08-29 DIAGNOSIS — I159 Secondary hypertension, unspecified: Secondary | ICD-10-CM | POA: Diagnosis not present

## 2015-08-29 DIAGNOSIS — R200 Anesthesia of skin: Secondary | ICD-10-CM

## 2015-08-29 LAB — BASIC METABOLIC PANEL
BUN: 12 mg/dL (ref 6–23)
CHLORIDE: 105 meq/L (ref 96–112)
CO2: 27 mEq/L (ref 19–32)
CREATININE: 0.75 mg/dL (ref 0.40–1.20)
Calcium: 9.7 mg/dL (ref 8.4–10.5)
GFR: 77.95 mL/min (ref >60.00)
Glucose, Bld: 100 mg/dL — ABNORMAL HIGH (ref 70–99)
Potassium: 4.3 mEq/L (ref 3.5–5.1)
SODIUM: 140 meq/L (ref 135–145)

## 2015-08-29 LAB — HEPATIC FUNCTION PANEL
ALBUMIN: 4.1 g/dL (ref 3.5–5.2)
ALK PHOS: 55 U/L (ref 39–117)
ALT: 11 U/L (ref 0–35)
AST: 16 U/L (ref 0–37)
BILIRUBIN DIRECT: 0.1 mg/dL (ref 0.0–0.3)
BILIRUBIN TOTAL: 0.5 mg/dL (ref 0.2–1.2)
Total Protein: 7.2 g/dL (ref 6.0–8.3)

## 2015-08-29 LAB — LIPID PANEL
Cholesterol: 179 mg/dL (ref 0–200)
HDL: 70.2 mg/dL (ref >39.00)
LDL Cholesterol: 90 mg/dL (ref 0–99)
NONHDL: 108.39
Total CHOL/HDL Ratio: 3
Triglycerides: 92 mg/dL (ref 0.0–149.0)
VLDL: 18.4 mg/dL (ref 0.0–40.0)

## 2015-08-29 LAB — CBC WITH DIFFERENTIAL/PLATELET
BASOS PCT: 0.4 % (ref 0.0–3.0)
Basophils Absolute: 0 10*3/uL (ref 0.0–0.1)
EOS PCT: 1 % (ref 0.0–5.0)
Eosinophils Absolute: 0.1 10*3/uL (ref 0.0–0.7)
HCT: 36.2 % (ref 36.0–46.0)
Hemoglobin: 12.1 g/dL (ref 12.0–15.0)
LYMPHS ABS: 1.2 10*3/uL (ref 0.7–4.0)
Lymphocytes Relative: 19.8 % (ref 12.0–46.0)
MCHC: 33.3 g/dL (ref 30.0–36.0)
MCV: 84.1 fl (ref 78.0–100.0)
MONOS PCT: 6 % (ref 3.0–12.0)
Monocytes Absolute: 0.4 10*3/uL (ref 0.1–1.0)
NEUTROS ABS: 4.5 10*3/uL (ref 1.4–7.7)
NEUTROS PCT: 72.8 % (ref 43.0–77.0)
PLATELETS: 358 10*3/uL (ref 150.0–400.0)
RBC: 4.31 Mil/uL (ref 3.87–5.11)
RDW: 14.7 % (ref 11.5–15.5)
WBC: 6.1 10*3/uL (ref 4.0–10.5)

## 2015-08-29 LAB — FERRITIN: FERRITIN: 17.3 ng/mL (ref 10.0–291.0)

## 2015-08-29 LAB — IBC PANEL
Iron: 65 ug/dL (ref 42–145)
SATURATION RATIOS: 16.4 % — AB (ref 20.0–50.0)
TRANSFERRIN: 283 mg/dL (ref 212.0–360.0)

## 2015-08-29 LAB — VITAMIN B12: Vitamin B-12: 426 pg/mL (ref 211–911)

## 2015-08-29 LAB — HEMOGLOBIN A1C: HEMOGLOBIN A1C: 5.9 % (ref 4.6–6.5)

## 2015-09-01 ENCOUNTER — Encounter: Payer: Self-pay | Admitting: Internal Medicine

## 2015-09-01 ENCOUNTER — Ambulatory Visit (INDEPENDENT_AMBULATORY_CARE_PROVIDER_SITE_OTHER): Payer: Medicare Other | Admitting: Internal Medicine

## 2015-09-01 DIAGNOSIS — R202 Paresthesia of skin: Secondary | ICD-10-CM

## 2015-09-01 DIAGNOSIS — I639 Cerebral infarction, unspecified: Secondary | ICD-10-CM | POA: Diagnosis not present

## 2015-09-01 DIAGNOSIS — E7800 Pure hypercholesterolemia, unspecified: Secondary | ICD-10-CM | POA: Diagnosis not present

## 2015-09-01 DIAGNOSIS — I2510 Atherosclerotic heart disease of native coronary artery without angina pectoris: Secondary | ICD-10-CM | POA: Diagnosis not present

## 2015-09-01 DIAGNOSIS — I6523 Occlusion and stenosis of bilateral carotid arteries: Secondary | ICD-10-CM

## 2015-09-01 DIAGNOSIS — R200 Anesthesia of skin: Secondary | ICD-10-CM

## 2015-09-01 DIAGNOSIS — R739 Hyperglycemia, unspecified: Secondary | ICD-10-CM

## 2015-09-01 DIAGNOSIS — I159 Secondary hypertension, unspecified: Secondary | ICD-10-CM

## 2015-09-01 DIAGNOSIS — F329 Major depressive disorder, single episode, unspecified: Secondary | ICD-10-CM | POA: Diagnosis not present

## 2015-09-01 DIAGNOSIS — F32A Depression, unspecified: Secondary | ICD-10-CM

## 2015-09-01 DIAGNOSIS — I700 Atherosclerosis of aorta: Secondary | ICD-10-CM

## 2015-09-01 MED ORDER — MUPIROCIN 2 % EX OINT: 1.0000 "application " | TOPICAL_OINTMENT | Freq: Two times a day (BID) | CUTANEOUS | 0 refills | Status: DC

## 2015-09-01 MED ORDER — PAROXETINE HCL 30 MG PO TABS: 30.0000 mg | ORAL_TABLET | Freq: Every day | ORAL | 1 refills | Status: DC

## 2015-09-01 NOTE — Progress Notes (Signed)
Patient ID: Tamara [name], female   DOB: Jan 01, 1928, 82 y.o.   MRN: [id number]   Subjective:    Patient ID: Tamara [name], female    DOB: September 18, 1927, 82 y.o.   MRN: [id number]  HPI  Patient here for a scheduled follow up.  She reports she is doing better.  Feels better.  No abdominal pain or cramping.  Bowels doing well.  No chest pain.  No sob.  No acid reflux.  Eating.  States has a good appetite.  No nausea or vomiting.  No fever.  Having some right arm numbness.  Worse when sitting and reading a book.  No weakness.  Persistent intermittent numbness.  No neck pain.     Past Medical History:  Diagnosis Date  . Anemia   . Arthritis   . Chicken pox   . Colitis   . Degenerative joint disease   . Diverticulitis   . Diverticulosis   . Fibrocystic breast disease   . GERD (gastroesophageal reflux disease)    EGD - gastritis/mild duodenitis, previous positive H. pylori  . Glaucoma   . Heart murmur   . Helicobacter pylori ab+   . History of depression   . History of skin cancer    facial  . Hypercholesterolemia   . Hypertension   . IBS (irritable bowel syndrome)   . Internal hemorrhoids   . Migraines   . Osteopenia   . Stroke ([place])   . Tubular adenoma of colon   . Vitamin D deficiency    Past Surgical History:  Procedure Laterality Date  . ABDOMINAL HYSTERECTOMY  1973   secondary to bleeding  . APPENDECTOMY    . BILATERAL SALPINGOOPHORECTOMY  2004   complex ovarian cyst (left) - benign  . BREAST BIOPSY Bilateral    x4  . MANDIBLE FRACTURE SURGERY    . VENTRAL HERNIA REPAIR  2005   Dr. Tamala Julian   Family History  Problem Relation Age of Onset  . Esophageal cancer Mother   . Stomach cancer Mother   . Heart disease Father   . Cirrhosis Brother   . Parkinson's disease Brother   . Ovarian cancer Sister   . Colon cancer Sister     half-sister   Social History   Social History  . Marital status: Widowed    Spouse name: N/A  . Number of children: 2  . Years of  education: N/A   Occupational History  . retired    Social History Main Topics  . Smoking status: Never Smoker  . Smokeless tobacco: Never Used  . Alcohol use No  . Drug use: No  . Sexual activity: Not Asked   Other Topics Concern  . None   Social History Narrative  . None    Outpatient Encounter Prescriptions as of 09/01/2015  Medication Sig  . aspirin 81 MG tablet Take 81 mg by mouth daily.  . clopidogrel (PLAVIX) 75 MG tablet Take 1 tablet (75 mg total) by mouth daily.  [name] losartan (COZAAR) 50 MG tablet Take 1 tablet (50 mg total) by mouth daily.  [name] PARoxetine (PAXIL) 30 MG tablet Take 1 tablet (30 mg total) by mouth daily.  . Probiotic Product (PROBIOTIC PO) Take 1 capsule by mouth daily.  . rosuvastatin (CRESTOR) 40 MG tablet Take 1 tablet (40 mg total) by mouth daily.  . temazepam (RESTORIL) 15 MG capsule Take 1 capsule (15 mg total) by mouth at bedtime.  . [DISCONTINUED] PARoxetine (PAXIL) 30 MG tablet Take 1  tablet (30 mg total) by mouth daily.  . mupirocin ointment (BACTROBAN) 2 % Place 1 application into the nose 2 (two) times daily.   No facility-administered encounter medications on file as of 09/01/2015.     Review of Systems  Constitutional: Negative for appetite change and unexpected weight change.  HENT: Negative for congestion and sinus pressure.   Respiratory: Negative for cough, chest tightness and shortness of breath.   Cardiovascular: Negative for chest pain, palpitations and leg swelling.  Gastrointestinal: Negative for abdominal pain, diarrhea, nausea and vomiting.  Genitourinary: Negative for difficulty urinating and dysuria.  Musculoskeletal: Negative for back pain and joint swelling.  Skin: Negative for color change and rash.  Neurological: Negative for dizziness, light-headedness and headaches.       Right arm numbness as outlined.    Psychiatric/Behavioral: Negative for agitation and dysphoric mood.       Objective:    Physical Exam    Constitutional: She appears well-developed and well-nourished. No distress.  HENT:  Nose: Nose normal.  Mouth/Throat: Oropharynx is clear and moist.  Neck: Neck supple. No thyromegaly present.  Cardiovascular: Normal rate and regular rhythm.   Pulmonary/Chest: Breath sounds normal. No respiratory distress. She has no wheezes.  Abdominal: Soft. Bowel sounds are normal. There is no tenderness.  Musculoskeletal: She exhibits no edema or tenderness.  Negative phalens.    Lymphadenopathy:    She has no cervical adenopathy.  Skin: No rash noted. No erythema.  Psychiatric: She has a normal mood and affect. Her behavior is normal.    BP 134/64   Pulse 76   Temp 98.3 F (36.8 C)   Resp 12   Wt 138 lb (62.6 kg)   LMP 01/17/1972   BMI 23.69 kg/m  Wt Readings from Last 3 Encounters:  09/01/15 138 lb (62.6 kg)  05/31/15 143 lb (64.9 kg)  05/15/15 142 lb 4 oz (64.5 kg)     Lab Results  Component Value Date   WBC 6.1 08/29/2015   HGB 12.1 08/29/2015   HCT 36.2 08/29/2015   PLT 358.0 08/29/2015   GLUCOSE 100 (H) 08/29/2015   CHOL 179 08/29/2015   TRIG 92.0 08/29/2015   HDL 70.20 08/29/2015   LDLDIRECT 177.7 01/19/2013   LDLCALC 90 08/29/2015   ALT 11 08/29/2015   AST 16 08/29/2015   NA 140 08/29/2015   K 4.3 08/29/2015   CL 105 08/29/2015   CREATININE 0.75 08/29/2015   BUN 12 08/29/2015   CO2 27 08/29/2015   TSH 3.69 04/21/2015   HGBA1C 5.9 08/29/2015       Assessment & Plan:   Problem List Items Addressed This Visit    Aortic atherosclerosis ([place])    Noted on CT.  On crestor.        CAD (coronary artery disease)    Seen on CT.  On crestor.  Follow.  Continue risk factor modification.        CVA (cerebral vascular accident) [facility])    Previous CVA.  On aspiring and plavix.  Has the arm numbness as outlined.  She previously declined MRI.  States carotid ultrasound ok.  Refer to neurology for evaluation of arm numbness.  May need nerve conduction studies.         Depression    On paxil.  Stable.        Relevant Medications   PARoxetine (PAXIL) 30 MG tablet   Hypercholesterolemia    On crestor.  Cholesterol much improved.  Continue current medication regimen.  Low cholesterol diet and exercise.  Follow.        Relevant Orders   Lipid panel   Hepatic function panel   Hyperglycemia    Low carb diet and exercise.  Follow met b and a1c.       Relevant Orders   Hemoglobin B3I   Basic metabolic panel   Hypertension    Blood pressure under good control.  Continue same medication regimen.  Follow pressures.  Follow metabolic panel.        Right arm numbness    Continues to be an issue.  No focal findings on exam.  Refer to neurology for further evaluation and w/up.  May need nerve conduction studies.        Relevant Orders   Ambulatory referral to Neurology    Other Visit Diagnoses   None.      Einar Pheasant, MD

## 2015-09-01 NOTE — Progress Notes (Signed)
Pre visit review using our clinic review tool, if applicable. No additional management support is needed unless otherwise documented below in the visit note. 

## 2015-09-02 NOTE — Assessment & Plan Note (Signed)
Previous CVA.  On aspiring and plavix.  Has the arm numbness as outlined.  She previously declined MRI.  States carotid ultrasound ok.  Refer to neurology for evaluation of arm numbness.  May need nerve conduction studies.

## 2015-09-02 NOTE — Assessment & Plan Note (Signed)
Low carb diet and exercise.  Follow met b and a1c.  

## 2015-09-02 NOTE — Assessment & Plan Note (Signed)
Continues to be an issue.  No focal findings on exam.  Refer to neurology for further evaluation and w/up.  May need nerve conduction studies.

## 2015-09-02 NOTE — Assessment & Plan Note (Signed)
Seen on CT.  On crestor.  Follow.  Continue risk factor modification.

## 2015-09-02 NOTE — Assessment & Plan Note (Signed)
Noted on CT.  On crestor.

## 2015-09-02 NOTE — Assessment & Plan Note (Signed)
Blood pressure under good control.  Continue same medication regimen.  Follow pressures.  Follow metabolic panel.   

## 2015-09-02 NOTE — Assessment & Plan Note (Signed)
On paxil.  Stable.  

## 2015-09-02 NOTE — Assessment & Plan Note (Signed)
On crestor.  Cholesterol much improved.  Continue current medication regimen.  Low cholesterol diet and exercise.  Follow.

## 2015-09-25 ENCOUNTER — Other Ambulatory Visit: Payer: Self-pay

## 2015-11-01 ENCOUNTER — Telehealth: Payer: Self-pay | Admitting: *Deleted

## 2015-11-01 MED ORDER — TEMAZEPAM 15 MG PO CAPS: 15.0000 mg | ORAL_CAPSULE | Freq: Every day | ORAL | 0 refills | Status: DC

## 2015-11-01 NOTE — Telephone Encounter (Signed)
faxed

## 2015-11-01 NOTE — Telephone Encounter (Signed)
Ok to fill? Last filled 08/04/15

## 2015-11-01 NOTE — Telephone Encounter (Signed)
Pt requested a medication refill for temazepam Pharmacy [facility]

## 2015-11-03 NOTE — Telephone Encounter (Signed)
Pt stated that pharmacy did not receive [place]

## 2015-11-03 NOTE — Telephone Encounter (Signed)
Was faxed to [facility], faxed it to [facility] today and faxed cancellation of rx to [facility]

## 2015-11-06 ENCOUNTER — Telehealth: Payer: Self-pay | Admitting: *Deleted

## 2015-11-06 NOTE — Telephone Encounter (Signed)
Pt's daughter stated that pt is currently having lower back pain, that Dr.Scott is aware of -per daughter. Daughter stated that pt was advise to call the office to have something prescribed, if needed. Contact daughter:Candace [phone]

## 2015-11-06 NOTE — Telephone Encounter (Signed)
Spoke with patient and she stated that she can take Tylenol due to causing a rash. She was taking ibuprofen, but that was affecting her kidney function. I have scheduled her an appointment with Joycelyn Schmid for tomorrow at 3 PM due to back pain being so bad.

## 2015-11-06 NOTE — Telephone Encounter (Signed)
Can you please call this patient, thanks

## 2015-11-06 NOTE — Telephone Encounter (Signed)
Please advise 

## 2015-11-06 NOTE — Telephone Encounter (Signed)
Reviewed her history.  She had does have reported intermittent back pain.  Pt had informed takes tylenol prn.  Last visit - no problems with her back mentioned.  Stated she was feeling overall better.  If increased pain, sounds like she needs to be evaluated.  Is she taking any tylenol?  If not, can take scheduled tylenol - 2 extra strength bid to tid prn.  If taking and increased pain, needs evaluation to confirm nothing acute going on.

## 2015-11-07 ENCOUNTER — Telehealth: Payer: Self-pay | Admitting: Family

## 2015-11-07 ENCOUNTER — Encounter: Payer: Self-pay | Admitting: Family

## 2015-11-07 ENCOUNTER — Ambulatory Visit (INDEPENDENT_AMBULATORY_CARE_PROVIDER_SITE_OTHER): Payer: Medicare Other | Admitting: Family

## 2015-11-07 VITALS — BP 165/75 | HR 70 | Temp 97.8°F | Wt 138.2 lb

## 2015-11-07 DIAGNOSIS — M545 Low back pain: Secondary | ICD-10-CM | POA: Diagnosis not present

## 2015-11-07 DIAGNOSIS — I10 Essential (primary) hypertension: Secondary | ICD-10-CM | POA: Diagnosis not present

## 2015-11-07 DIAGNOSIS — I6523 Occlusion and stenosis of bilateral carotid arteries: Secondary | ICD-10-CM | POA: Diagnosis not present

## 2015-11-07 MED ORDER — CYCLOBENZAPRINE HCL 5 MG PO TABS: 5.0000 mg | ORAL_TABLET | Freq: Every evening | ORAL | 0 refills | Status: DC | PRN

## 2015-11-07 NOTE — Telephone Encounter (Signed)
FYI- so you don't need to call patient :)   I spoke on my personal cell phone with patient's daughter at [place] and daughter checked BP - it was 129/86, HR 75. Patient is not having CP, SOB. Very reassured particularly with patient's h/o CVA.  She will continue to monitor. I also advised that patient NOT take restoril while on flexeril as could be very sedative. Advised caution with muscle relaxant as may cause drowsiness. Daughter verbalized understanding.

## 2015-11-07 NOTE — Patient Instructions (Signed)
Trial of flexeril  Heat  Gentle stretching.

## 2015-11-07 NOTE — Assessment & Plan Note (Addendum)
Elevated. Gave patient 0.1 mg clonidine. Rechecked and 165/75. Reassured  patient has no symptoms of hypertensive urgency or emergency at this time. Patient and daughter ready to leave and stated they would call me this afternoon with a blood pressure reading from home. We both agreed is likely the pain of her low back that is elevating her blood pressure. If we can get that better controlled with Flexeril, blood pressure should also improve.

## 2015-11-07 NOTE — Progress Notes (Signed)
Pre visit review using our clinic review tool, if applicable. No additional management support is needed unless otherwise documented below in the visit note. 

## 2015-11-07 NOTE — Assessment & Plan Note (Signed)
Symptoms consistent with muscle spasm after being in dentist's chair last week. We'll trial Flexeril at bedtime and advised patient heat, gentle stretching.

## 2015-11-07 NOTE — Progress Notes (Signed)
Subjective:    Patient ID: Tamara [name], female    DOB: 10/08/1927, 82 y.o.   MRN: [id number]  CC: Tamara [name] is a 82 y.o. female who presents today for an acute visit.    HPI: Patient here for acute visit for low back pain past 3 days. Had dental surgery for an hour and half and felt tense 4 days ago. Accompanied by daughter H/o DDD. Has been taking advil which usually works however stopped taking because of h/o of decreased renal function. Had taken hydrocodone for dental work however didn't help low back. Dull pain radiating from low back to bilateral knees.no numbness, tingling.   No dysuria, abdominal pain, fever, chills, falls, h/o cancer.   No h/o renal stone.   Blood pressure elevated. Patient has been compliant with medications however she's been a lot of pain and suspects it has raised her blood pressure.Denies exertional chest pain or pressure, numbness or tingling radiating to left arm or jaw, palpitations, dizziness, frequent headaches, changes in vision, or shortness of breath.      HISTORY:  Past Medical History:  Diagnosis Date  . Anemia   . Arthritis   . Chicken pox   . Colitis   . Degenerative joint disease   . Diverticulitis   . Diverticulosis   . Fibrocystic breast disease   . GERD (gastroesophageal reflux disease)    EGD - gastritis/mild duodenitis, previous positive H. pylori  . Glaucoma   . Heart murmur   . Helicobacter pylori ab+   . History of depression   . History of skin cancer    facial  . Hypercholesterolemia   . Hypertension   . IBS (irritable bowel syndrome)   . Internal hemorrhoids   . Migraines   . Osteopenia   . Stroke ([place])   . Tubular adenoma of colon   . Vitamin D deficiency    Past Surgical History:  Procedure Laterality Date  . ABDOMINAL HYSTERECTOMY  1973   secondary to bleeding  . APPENDECTOMY    . BILATERAL SALPINGOOPHORECTOMY  2004   complex ovarian cyst (left) - benign  . BREAST BIOPSY Bilateral    x4  .  MANDIBLE FRACTURE SURGERY    . VENTRAL HERNIA REPAIR  2005   Dr. Tamala Julian   Family History  Problem Relation Age of Onset  . Esophageal cancer Mother   . Stomach cancer Mother   . Heart disease Father   . Cirrhosis Brother   . Parkinson's disease Brother   . Ovarian cancer Sister   . Colon cancer Sister     half-sister    Allergies: Flagyl [metronidazole]; Other; Prednisone; Shrimp [shellfish allergy]; and Vicodin [hydrocodone-acetaminophen] Current Outpatient Prescriptions on File Prior to Visit  Medication Sig Dispense Refill  . aspirin 81 MG tablet Take 81 mg by mouth daily.    . clopidogrel (PLAVIX) 75 MG tablet Take 1 tablet (75 mg total) by mouth daily. 30 tablet 0  . losartan (COZAAR) 50 MG tablet Take 1 tablet (50 mg total) by mouth daily. 90 tablet 1  . mupirocin ointment (BACTROBAN) 2 % Place 1 application into the nose 2 (two) times daily. 22 g 0  . PARoxetine (PAXIL) 30 MG tablet Take 1 tablet (30 mg total) by mouth daily. 90 tablet 1  . Probiotic Product (PROBIOTIC PO) Take 1 capsule by mouth daily.    . rosuvastatin (CRESTOR) 40 MG tablet Take 1 tablet (40 mg total) by mouth daily. 90 tablet 3  .  temazepam (RESTORIL) 15 MG capsule Take 1 capsule (15 mg total) by mouth at bedtime. 90 capsule 0   No current facility-administered medications on file prior to visit.     Social History  Substance Use Topics  . Smoking status: Never Smoker  . Smokeless tobacco: Never Used  . Alcohol use No    Review of Systems  Constitutional: Negative for chills and fever.  Eyes: Negative for visual disturbance.  Respiratory: Negative for cough.   Cardiovascular: Negative for chest pain and palpitations.  Gastrointestinal: Negative for abdominal distention, abdominal pain, anal bleeding, nausea and vomiting.  Genitourinary: Negative for dysuria and flank pain.  Musculoskeletal: Positive for back pain. Negative for myalgias.  Neurological: Negative for dizziness, numbness and  headaches.      Objective:    BP (!) 165/75   Pulse 70   Temp 97.8 F (36.6 C) (Oral)   Wt 138 lb 3.2 oz (62.7 kg)   LMP 01/17/1972   SpO2 95%   BMI 23.72 kg/m    Physical Exam  Constitutional: She appears well-developed and well-nourished.  Eyes: Conjunctivae are normal.  Cardiovascular: Normal rate, regular rhythm, normal heart sounds and normal pulses.   Pulmonary/Chest: Effort normal and breath sounds normal. She has no wheezes. She has no rhonchi. She has no rales.  Abdominal: There is no CVA tenderness.  Musculoskeletal:       Lumbar back: She exhibits tenderness, pain and spasm. She exhibits normal range of motion, no bony tenderness, no swelling and no edema.  Full range of motion with flexion, tension, lateral side bends. No bony tenderness. No pain, numbness, tingling elicited with single leg raise bilaterally.   Neurological: She is alert. She has normal strength. No sensory deficit.  Reflex Scores:      Patellar reflexes are 2+ on the right side and 2+ on the left side. Sensation and strength intact bilateral lower extremities.  Skin: Skin is warm and dry.  Psychiatric: She has a normal mood and affect. Her speech is normal and behavior is normal. Thought content normal.  Vitals reviewed.      Assessment & Plan:   Problem List Items Addressed This Visit      Cardiovascular and Mediastinum   Hypertension    Elevated. Gave patient 0.1 mg clonidine. Rechecked and 165/75. Reassured  patient has no symptoms of hypertensive urgency or emergency at this time. Patient and daughter ready to leave and stated they would call me this afternoon with a blood pressure reading from home. We both agreed is likely the pain of her low back that is elevating her blood pressure. If we can get that better controlled with Flexeril, blood pressure should also improve.        Other   Back pain - Primary    Symptoms consistent with muscle spasm after being in dentist's chair last  week. We'll trial Flexeril at bedtime and advised patient heat, gentle stretching.      Relevant Medications   cyclobenzaprine (FLEXERIL) 5 MG tablet    Other Visit Diagnoses   None.          I am having Tamara [name] start on cyclobenzaprine. I am also having her maintain her aspirin, Probiotic Product (PROBIOTIC PO), clopidogrel, rosuvastatin, losartan, PARoxetine, mupirocin ointment, temazepam, amoxicillin, and chlorhexidine.   Meds ordered this encounter  Medications  . amoxicillin (AMOXIL) 500 MG capsule  . chlorhexidine (PERIDEX) 0.12 % solution  . cyclobenzaprine (FLEXERIL) 5 MG tablet    Sig: Take 1  tablet (5 mg total) by mouth at bedtime as needed for muscle spasms.    Dispense:  15 tablet    Refill:  0    Order Specific Question:   Supervising Provider    Answer:   Crecencio Mc [[id number]]    Return precautions given.   Risks, benefits, and alternatives of the medications and treatment plan prescribed today were discussed, and patient expressed understanding.   Education regarding symptom management and diagnosis given to patient on AVS.  Continue to follow with Einar Pheasant, MD for routine health maintenance.   Cleophus Molt and I agreed with plan.   Mable Paris, FNP

## 2015-11-13 ENCOUNTER — Ambulatory Visit: Payer: Medicare Other | Admitting: Cardiovascular Disease

## 2015-12-04 ENCOUNTER — Ambulatory Visit (INDEPENDENT_AMBULATORY_CARE_PROVIDER_SITE_OTHER): Payer: Medicare Other | Admitting: Cardiovascular Disease

## 2015-12-04 ENCOUNTER — Encounter: Payer: Self-pay | Admitting: Cardiovascular Disease

## 2015-12-04 ENCOUNTER — Other Ambulatory Visit: Payer: Self-pay | Admitting: Internal Medicine

## 2015-12-04 VITALS — BP 120/68 | HR 74 | Ht 63.0 in | Wt 137.2 lb

## 2015-12-04 DIAGNOSIS — I2510 Atherosclerotic heart disease of native coronary artery without angina pectoris: Secondary | ICD-10-CM

## 2015-12-04 DIAGNOSIS — I639 Cerebral infarction, unspecified: Secondary | ICD-10-CM

## 2015-12-04 DIAGNOSIS — I10 Essential (primary) hypertension: Secondary | ICD-10-CM | POA: Diagnosis not present

## 2015-12-04 DIAGNOSIS — E7800 Pure hypercholesterolemia, unspecified: Secondary | ICD-10-CM

## 2015-12-04 DIAGNOSIS — I6523 Occlusion and stenosis of bilateral carotid arteries: Secondary | ICD-10-CM | POA: Diagnosis not present

## 2015-12-04 DIAGNOSIS — I700 Atherosclerosis of aorta: Secondary | ICD-10-CM

## 2015-12-04 NOTE — Patient Instructions (Addendum)

## 2015-12-04 NOTE — Progress Notes (Signed)
Cardiology Office Note  Date:  12/04/2015   ID:  Tamara [name], DOB 07-Nov-1927, MRN [id number]  PCP:  Einar Pheasant, MD   Chief Complaint  Patient presents with  . other    "doing well." Meds reviewed by the pt. verbally.     HPI:  Tamara [name] is a very pleasant 82 year old woman with hyperlipidemia, hypertension,  Previous Stroke August 2014 . Mild carotid plaque bilaterally seen Depression mild to moderate diffuse aorta and coronary plaquing/atherosclerosis on CT CAD on CT scan She presents today for follow-up of her blood pressure and history of stroke  In follow-up today she reports that she is doing well Cat is sick  She is tolerating Crestor 40 mg daily Reports having some back pain, joint ache. Daughter reports this is chronic issue, less likely from the statin Denies any chest pain or shortness of breath symptoms, no recent TIA symptoms  Lab work reviewed HBA1C 5.9 Total chol 179,  LDL 90 On crestor 40 mg daily  EKG on today's visit shows normal sinus rhythm with rate 74 bpm, right bundle branch block  Other past medical history reviewed History of diverticulitis, went to the emergency room twice, was given Cipro and Flagyl Family reports that she had a reaction to the Flagy  CT scan  showed mild to moderate diffuse aorta and coronary plaquing/atherosclerosis.   admitted to the hospital August 25 and discharged 09/22/2012. Diagnosis of embolic infarct in the left parietal area with right hand paresthesias and expressive aphasia.  no documented atrial fibrillation though significant concern given history of fluttering and palpitations.  30 day Holter did not show any significant arrhythmia  residual deficits have returned to normal.  On aspirin and Plavix with no further symptoms  Echocardiogram 09/22/2012 shows ejection fraction 55-60% mild mild MR and TR, normal right ventricular systolic pressures bubble study was not performed to look for PFO EKG in the  hospital showed normal sinus rhythm with rate 61 beats per minute, essentially normal MRI of the brain showed abnormal signal in the left high posterior parietal region consistent with acute ischemia Carotid ultrasound showed no hemodynamic significance. There is calcified mural plaque identified on the right and left, less than 50%   PMH:   has a past medical history of Anemia; Arthritis; Chicken pox; Colitis; Degenerative joint disease; Diverticulitis; Diverticulosis; Fibrocystic breast disease; GERD (gastroesophageal reflux disease); Glaucoma; Heart murmur; Helicobacter pylori ab+; History of depression; History of skin cancer; Hypercholesterolemia; Hypertension; IBS (irritable bowel syndrome); Internal hemorrhoids; Migraines; Osteopenia; Stroke ([place]); Tubular adenoma of colon; and Vitamin D deficiency.  PSH:    Past Surgical History:  Procedure Laterality Date  . ABDOMINAL HYSTERECTOMY  1973   secondary to bleeding  . APPENDECTOMY    . BILATERAL SALPINGOOPHORECTOMY  2004   complex ovarian cyst (left) - benign  . BREAST BIOPSY Bilateral    x4  . MANDIBLE FRACTURE SURGERY    . VENTRAL HERNIA REPAIR  2005   Dr. Tamala Julian    Current Outpatient Prescriptions  Medication Sig Dispense Refill  . aspirin 81 MG tablet Take 81 mg by mouth daily.    . clopidogrel (PLAVIX) 75 MG tablet Take 1 tablet (75 mg total) by mouth daily. 30 tablet 0  . losartan (COZAAR) 50 MG tablet Take 1 tablet (50 mg total) by mouth daily. 90 tablet 1  . mupirocin ointment (BACTROBAN) 2 % Place 1 application into the nose 2 (two) times daily. 22 g 0  . PARoxetine (PAXIL) 30 MG tablet  Take 1 tablet (30 mg total) by mouth daily. 90 tablet 1  . Probiotic Product (PROBIOTIC PO) Take 1 capsule by mouth daily.    . rosuvastatin (CRESTOR) 40 MG tablet Take 1 tablet (40 mg total) by mouth daily. 90 tablet 3  . temazepam (RESTORIL) 15 MG capsule Take 1 capsule (15 mg total) by mouth at bedtime. 90 capsule 0   No current  facility-administered medications for this visit.      Allergies:   Flagyl [metronidazole]; Other; Prednisone; Shrimp [shellfish allergy]; and Vicodin [hydrocodone-acetaminophen]   Social History:  The patient  reports that she has never smoked. She has never used smokeless tobacco. She reports that she does not drink alcohol or use drugs.   Family History:   family history includes Cirrhosis in her brother; Colon cancer in her sister; Esophageal cancer in her mother; Heart disease in her father; Ovarian cancer in her sister; Parkinson's disease in her brother; Stomach cancer in her mother.    Review of Systems: Review of Systems  Constitutional: Negative.        Hurts all over, joint ache  Respiratory: Negative.   Cardiovascular: Negative.   Gastrointestinal: Negative.   Musculoskeletal: Positive for back pain and joint pain.  Neurological: Negative.   Psychiatric/Behavioral: Negative.   All other systems reviewed and are negative.    PHYSICAL EXAM: VS:  BP 120/68 (BP Location: Left Arm, Patient Position: Sitting, Cuff Size: Normal)   Pulse 74   Ht 5\' 3"  (1.6 m)   Wt 137 lb 4 oz (62.3 kg)   LMP 01/17/1972   BMI 24.31 kg/m  , BMI Body mass index is 24.31 kg/m. GEN: Well nourished, well developed, in no acute distress  HEENT: normal  Neck: no JVD, carotid bruits, or masses Cardiac: RRR; no murmurs, rubs, or gallops,no edema  Respiratory:  clear to auscultation bilaterally, normal work of breathing GI: soft, nontender, nondistended, + BS MS: no deformity or atrophy  Skin: warm and dry, no rash Neuro:  Strength and sensation are intact Psych: euthymic mood, full affect    Recent Labs: 04/21/2015: TSH 3.69 08/29/2015: ALT 11; BUN 12; Creatinine, Ser 0.75; Hemoglobin 12.1; Platelets 358.0; Potassium 4.3; Sodium 140    Lipid Panel Lab Results  Component Value Date   CHOL 179 08/29/2015   HDL 70.20 08/29/2015   LDLCALC 90 08/29/2015   TRIG 92.0 08/29/2015      Wt  Readings from Last 3 Encounters:  12/04/15 137 lb 4 oz (62.3 kg)  11/07/15 138 lb 3.2 oz (62.7 kg)  09/01/15 138 lb (62.6 kg)       ASSESSMENT AND PLAN:  Essential hypertension Blood pressure is well controlled on today's visit. No changes made to the medications.  Hypercholesterolemia Dramatic improvement in her cholesterol numbers Discussed management with her. Could continue current regiment, alternatively could add zetia to her regimen Recommended that we see how her new members go in follow-up with Dr. Nicki Reaper   Cerebrovascular accident (CVA), unspecified mechanism ([place]) No recent symptoms, tolerating aspirin and Plavix   Bilateral carotid artery stenosis Smooth plaque bilaterally, less than 39%   Aortic atherosclerosis ([place]) Recommended the need to continue aggressive cholesterol management  Ideally goal LDL less than 70   Coronary artery disease involving native coronary artery of native heart without angina pectoris Currently with no symptoms of angina. No further workup at this time. Continue current medication regimen.   Total encounter time more than 25 minutes  Greater than 50% was spent in  counseling and coordination of care with the patient   Disposition:   F/U  12 months    Signed, Esmond Plants, M.D., Ph.D. 12/04/2015  [place], [address]

## 2015-12-05 NOTE — Addendum Note (Signed)
Addended by: Anselm Pancoast on: 12/05/2015 11:50 AM   Modules accepted: Orders

## 2015-12-06 ENCOUNTER — Ambulatory Visit: Payer: Medicare Other

## 2015-12-12 ENCOUNTER — Other Ambulatory Visit (INDEPENDENT_AMBULATORY_CARE_PROVIDER_SITE_OTHER): Payer: Medicare Other

## 2015-12-12 DIAGNOSIS — E7800 Pure hypercholesterolemia, unspecified: Secondary | ICD-10-CM | POA: Diagnosis not present

## 2015-12-12 DIAGNOSIS — R739 Hyperglycemia, unspecified: Secondary | ICD-10-CM | POA: Diagnosis not present

## 2015-12-12 LAB — HEPATIC FUNCTION PANEL
ALBUMIN: 4 g/dL (ref 3.5–5.2)
ALK PHOS: 57 U/L (ref 39–117)
ALT: 16 U/L (ref 0–35)
AST: 21 U/L (ref 0–37)
BILIRUBIN DIRECT: 0.1 mg/dL (ref 0.0–0.3)
Total Bilirubin: 0.4 mg/dL (ref 0.2–1.2)
Total Protein: 6.9 g/dL (ref 6.0–8.3)

## 2015-12-12 LAB — BASIC METABOLIC PANEL
BUN: 12 mg/dL (ref 6–23)
CALCIUM: 9.4 mg/dL (ref 8.4–10.5)
CO2: 30 meq/L (ref 19–32)
CREATININE: 0.69 mg/dL (ref 0.40–1.20)
Chloride: 104 mEq/L (ref 96–112)
GFR: 85.76 mL/min (ref >60.00)
Glucose, Bld: 96 mg/dL (ref 70–99)
Potassium: 4.3 mEq/L (ref 3.5–5.1)
Sodium: 140 mEq/L (ref 135–145)

## 2015-12-12 LAB — HEMOGLOBIN A1C: Hgb A1c MFr Bld: 6 % (ref 4.6–6.5)

## 2015-12-12 LAB — LIPID PANEL
CHOL/HDL RATIO: 2
Cholesterol: 182 mg/dL (ref 0–200)
HDL: 74.4 mg/dL (ref >39.00)
LDL Cholesterol: 93 mg/dL (ref 0–99)
NONHDL: 107.72
Triglycerides: 73 mg/dL (ref 0.0–149.0)
VLDL: 14.6 mg/dL (ref 0.0–40.0)

## 2015-12-15 ENCOUNTER — Encounter: Payer: Self-pay | Admitting: Internal Medicine

## 2015-12-15 ENCOUNTER — Ambulatory Visit (INDEPENDENT_AMBULATORY_CARE_PROVIDER_SITE_OTHER): Payer: Medicare Other | Admitting: Internal Medicine

## 2015-12-15 DIAGNOSIS — I10 Essential (primary) hypertension: Secondary | ICD-10-CM | POA: Diagnosis not present

## 2015-12-15 DIAGNOSIS — I639 Cerebral infarction, unspecified: Secondary | ICD-10-CM

## 2015-12-15 DIAGNOSIS — I2510 Atherosclerotic heart disease of native coronary artery without angina pectoris: Secondary | ICD-10-CM | POA: Diagnosis not present

## 2015-12-15 DIAGNOSIS — R739 Hyperglycemia, unspecified: Secondary | ICD-10-CM | POA: Diagnosis not present

## 2015-12-15 DIAGNOSIS — I6523 Occlusion and stenosis of bilateral carotid arteries: Secondary | ICD-10-CM

## 2015-12-15 DIAGNOSIS — E7800 Pure hypercholesterolemia, unspecified: Secondary | ICD-10-CM

## 2015-12-15 DIAGNOSIS — F329 Major depressive disorder, single episode, unspecified: Secondary | ICD-10-CM

## 2015-12-15 DIAGNOSIS — I700 Atherosclerosis of aorta: Secondary | ICD-10-CM

## 2015-12-15 DIAGNOSIS — F32A Depression, unspecified: Secondary | ICD-10-CM

## 2015-12-15 NOTE — Progress Notes (Signed)
Pre visit review using our clinic review tool, if applicable. No additional management support is needed unless otherwise documented below in the visit note. 

## 2015-12-15 NOTE — Progress Notes (Signed)
Patient ID: Tamara [name], female   DOB: 1928/01/02, 82 y.o.   MRN: [id number]   Subjective:    Patient ID: Tamara [name], female    DOB: 1928-01-17, 82 y.o.   MRN: [id number]  HPI  Patient here for a scheduled follow up.  She has had issues with her tooth.  Seeing her dentist.  Overall she feels she is doing relatively well.  Some increased stress, but she feels she is handling things relatively well.  Does not feel she needs anything more at this time.  No chest pain.  No sob.  No acid reflux.  No abdominal pain or cramping.  Bowels stable.     Past Medical History:  Diagnosis Date  . Anemia   . Arthritis   . Chicken pox   . Colitis   . Degenerative joint disease   . Diverticulitis   . Diverticulosis   . Fibrocystic breast disease   . GERD (gastroesophageal reflux disease)    EGD - gastritis/mild duodenitis, previous positive H. pylori  . Glaucoma   . Heart murmur   . Helicobacter pylori ab+   . History of depression   . History of skin cancer    facial  . Hypercholesterolemia   . Hypertension   . IBS (irritable bowel syndrome)   . Internal hemorrhoids   . Migraines   . Osteopenia   . Stroke ([place])   . Tubular adenoma of colon   . Vitamin D deficiency    Past Surgical History:  Procedure Laterality Date  . ABDOMINAL HYSTERECTOMY  1973   secondary to bleeding  . APPENDECTOMY    . BILATERAL SALPINGOOPHORECTOMY  2004   complex ovarian cyst (left) - benign  . BREAST BIOPSY Bilateral    x4  . MANDIBLE FRACTURE SURGERY    . VENTRAL HERNIA REPAIR  2005   Dr. Tamala Julian   Family History  Problem Relation Age of Onset  . Esophageal cancer Mother   . Stomach cancer Mother   . Heart disease Father   . Cirrhosis Brother   . Parkinson's disease Brother   . Ovarian cancer Sister   . Colon cancer Sister     half-sister   Social History   Social History  . Marital status: Widowed    Spouse name: N/A  . Number of children: 2  . Years of education: N/A   Occupational  History  . retired    Social History Main Topics  . Smoking status: Never Smoker  . Smokeless tobacco: Never Used  . Alcohol use No  . Drug use: No  . Sexual activity: Not Asked   Other Topics Concern  . None   Social History Narrative  . None    Outpatient Encounter Prescriptions as of 12/15/2015  Medication Sig  . aspirin 81 MG tablet Take 81 mg by mouth daily.  . clopidogrel (PLAVIX) 75 MG tablet Take 1 tablet (75 mg total) by mouth daily.  [name] losartan (COZAAR) 50 MG tablet TAKE 1 TABLET EVERY DAY  . mupirocin ointment (BACTROBAN) 2 % Place 1 application into the nose 2 (two) times daily.  [name] PARoxetine (PAXIL) 30 MG tablet Take 1 tablet (30 mg total) by mouth daily.  . Probiotic Product (PROBIOTIC PO) Take 1 capsule by mouth daily.  . rosuvastatin (CRESTOR) 40 MG tablet Take 1 tablet (40 mg total) by mouth daily.  . temazepam (RESTORIL) 15 MG capsule Take 1 capsule (15 mg total) by mouth at bedtime.   No facility-administered  encounter medications on file as of 12/15/2015.     Review of Systems  Constitutional: Negative for appetite change and unexpected weight change.  HENT: Negative for congestion and sinus pressure.        Tooth pain and problems as outlined.  Being followed by her dentist.   Respiratory: Negative for cough, chest tightness and shortness of breath.   Cardiovascular: Negative for chest pain, palpitations and leg swelling.  Gastrointestinal: Negative for abdominal pain, diarrhea, nausea and vomiting.  Genitourinary: Negative for difficulty urinating and dysuria.  Musculoskeletal: Negative for back pain and joint swelling.  Skin: Negative for color change and rash.  Neurological: Negative for dizziness, light-headedness and headaches.  Psychiatric/Behavioral: Negative for agitation and dysphoric mood.       Objective:     Blood pressure rechecked by me:  140/78  Physical Exam  Constitutional: She appears well-developed and well-nourished. No  distress.  HENT:  Nose: Nose normal.  Mouth/Throat: Oropharynx is clear and moist.  Neck: Neck supple. No thyromegaly present.  Cardiovascular: Normal rate and regular rhythm.   Pulmonary/Chest: Breath sounds normal. No respiratory distress. She has no wheezes.  Abdominal: Soft. Bowel sounds are normal. There is no tenderness.  Musculoskeletal: She exhibits no edema or tenderness.  Lymphadenopathy:    She has no cervical adenopathy.  Skin: No rash noted. No erythema.  Psychiatric: She has a normal mood and affect. Her behavior is normal.    BP 140/78   Pulse 71   Temp 97.8 F (36.6 C) (Oral)   Ht _0  (1.6 m)   Wt 139 lb 3.2 oz (63.1 kg)   LMP 01/17/1972   SpO2 97%   BMI 24.66 kg/m  Wt Readings from Last 3 Encounters:  12/15/15 139 lb 3.2 oz (63.1 kg)  12/04/15 137 lb 4 oz (62.3 kg)  11/07/15 138 lb 3.2 oz (62.7 kg)     Lab Results  Component Value Date   WBC 6.1 08/29/2015   HGB 12.1 08/29/2015   HCT 36.2 08/29/2015   PLT 358.0 08/29/2015   GLUCOSE 96 12/12/2015   CHOL 182 12/12/2015   TRIG 73.0 12/12/2015   HDL 74.40 12/12/2015   LDLDIRECT 177.7 01/19/2013   LDLCALC 93 12/12/2015   ALT 16 12/12/2015   AST 21 12/12/2015   NA 140 12/12/2015   K 4.3 12/12/2015   CL 104 12/12/2015   CREATININE 0.69 12/12/2015   BUN 12 12/12/2015   CO2 30 12/12/2015   TSH 3.69 04/21/2015   HGBA1C 6.0 12/12/2015       Assessment & Plan:   Problem List Items Addressed This Visit    Aortic atherosclerosis ([place])    Noted on CT.  On crestor.  Follow.        CAD (coronary artery disease)    Seen on CT.  On crestor.  Continue risk factor modification.  Follow.        CVA (cerebral vascular accident) [facility])    Previous CVA on aspirin and plavix.  Doing well.  Follow.        Depression    On paxil.  Some increased stress.  Overall she feels she is handling things relatively well.  Follow.  Notify me if needs something more.        Hypercholesterolemia    On crestor and  tolerating.  Low cholesterol diet and exercise.  Follow lipid panel and liver function tests.   Lab Results  Component Value Date   CHOL 182 12/12/2015  HDL 74.40 12/12/2015   LDLCALC 93 12/12/2015   LDLDIRECT 177.7 01/19/2013   TRIG 73.0 12/12/2015   CHOLHDL 2 12/12/2015        Hyperglycemia    Low carb diet and exercise.  Follow met b and a1c.        Hypertension    Blood pressure under reasonable control.  Continue same medication regimen.  Follow pressures.  Follow metabolic panel.            Einar Pheasant, MD

## 2015-12-17 ENCOUNTER — Encounter: Payer: Self-pay | Admitting: Internal Medicine

## 2015-12-17 NOTE — Assessment & Plan Note (Signed)
Previous CVA on aspirin and plavix.  Doing well.  Follow.

## 2015-12-17 NOTE — Assessment & Plan Note (Signed)
Low carb diet and exercise.  Follow met b and a1c.

## 2015-12-17 NOTE — Assessment & Plan Note (Signed)
On crestor and tolerating.  Low cholesterol diet and exercise.  Follow lipid panel and liver function tests.   Lab Results  Component Value Date   CHOL 182 12/12/2015   HDL 74.40 12/12/2015   LDLCALC 93 12/12/2015   LDLDIRECT 177.7 01/19/2013   TRIG 73.0 12/12/2015   CHOLHDL 2 12/12/2015

## 2015-12-17 NOTE — Assessment & Plan Note (Signed)
Blood pressure under reasonable control.  Continue same medication regimen.  Follow pressures.  Follow metabolic panel.   

## 2015-12-17 NOTE — Assessment & Plan Note (Signed)
Noted on CT.  On crestor.  Follow.

## 2015-12-17 NOTE — Assessment & Plan Note (Signed)
On paxil.  Some increased stress.  Overall she feels she is handling things relatively well.  Follow.  Notify me if needs something more.

## 2015-12-17 NOTE — Assessment & Plan Note (Signed)
Seen on CT.  On crestor.  Continue risk factor modification.  Follow.

## 2015-12-26 ENCOUNTER — Other Ambulatory Visit: Payer: Self-pay | Admitting: Internal Medicine

## 2016-01-02 ENCOUNTER — Ambulatory Visit (INDEPENDENT_AMBULATORY_CARE_PROVIDER_SITE_OTHER): Payer: Medicare Other

## 2016-01-02 VITALS — BP 122/62 | HR 68 | Temp 97.8°F | Resp 14 | Ht 63.5 in | Wt 138.4 lb

## 2016-01-02 DIAGNOSIS — Z0000 Encounter for general adult medical examination without abnormal findings: Secondary | ICD-10-CM

## 2016-01-02 NOTE — Patient Instructions (Addendum)
Tamara [name] , Thank you for taking time to come for your Medicare Wellness Visit. I appreciate your ongoing commitment to your health goals. Please review the following plan we discussed and let me know if I can assist you in the future.   FOLLOW UP WITH DR. Nicki Reaper AS NEEDED.  MERRY CHRISTMAS!!!  These are the goals we discussed: Goals    . Increase water intake          Stay hydrated and drink plenty of fluids.  Increase water intake by a minimum of 1 cup daily.       This is a list of the screening recommended for you and due dates:  Health Maintenance  Topic Date Due  . Complete foot exam   01/24/1940  . Eye exam for diabetics  01/24/1940  . Tetanus Vaccine  01/23/1949  . Shingles Vaccine  01/23/1990  . DEXA scan (bone density measurement)  01/24/1995  . Pneumonia vaccines (1 of 2 - PCV13) 01/24/1995  . Flu Shot  03/28/2016*  . Mammogram  02/24/2016  . Hemoglobin A1C  06/10/2016  . Colon Cancer Screening  04/21/2017  *Topic was postponed. The date shown is not the original due date.    Bone Densitometry Introduction Bone densitometry is an imaging test that uses a special X-ray to measure the amount of calcium and other minerals in your bones (bone density). This test is also known as a bone mineral density test or dual-energy X-ray absorptiometry (DXA). The test can measure bone density at your hip and your spine. It is similar to having a regular X-ray. You may have this test to:  Diagnose a condition that causes weak or thin bones (osteoporosis).  Predict your risk of a broken bone (fracture).  Determine how well osteoporosis treatment is working. Tell a health care provider about:  Any allergies you have.  All medicines you are taking, including vitamins, herbs, eye drops, creams, and over-the-counter medicines.  Any problems you or family members have had with anesthetic medicines.  Any blood disorders you have.  Any surgeries you have had.  Any medical  conditions you have.  Possibility of pregnancy.  Any other medical test you had within the previous 14 days that used contrast material. What are the risks? Generally, this is a safe procedure. However, problems can occur and may include the following:  This test exposes you to a very small amount of radiation.  The risks of radiation exposure may be greater to unborn children. What happens before the procedure?  Do not take any calcium supplements for 24 hours before having the test. You can otherwise eat and drink what you usually do.  Take off all metal jewelry, eyeglasses, dental appliances, and any other metal objects. What happens during the procedure?  You may lie on an exam table. There will be an X-ray generator below you and an imaging device above you.  Other devices, such as boxes or braces, may be used to position your body properly for the scan.  You will need to lie still while the machine slowly scans your body.  The images will show up on a computer monitor. What happens after the procedure? You may need more testing at a later time. This information is not intended to replace advice given to you by your health care provider. Make sure you discuss any questions you have with your health care provider. Document Released: 02/06/2004 Document Revised: 06/22/2015 Document Reviewed: 06/24/2013  2017 Elsevier  Fall Prevention in the Home Introduction Falls can cause injuries. They can happen to people of all ages. There are many things you can do to make your home safe and to help prevent falls. What can I do on the outside of my home?  Regularly fix the edges of walkways and driveways and fix any cracks.  Remove anything that might make you trip as you walk through a door, such as a raised step or threshold.  Trim any bushes or trees on the path to your home.  Use bright outdoor lighting.  Clear any walking paths of anything that might make someone trip,  such as rocks or tools.  Regularly check to see if handrails are loose or broken. Make sure that both sides of any steps have handrails.  Any raised decks and porches should have guardrails on the edges.  Have any leaves, snow, or ice cleared regularly.  Use sand or salt on walking paths during winter.  Clean up any spills in your garage right away. This includes oil or grease spills. What can I do in the bathroom?  Use night lights.  Install grab bars by the toilet and in the tub and shower. Do not use towel bars as grab bars.  Use non-skid mats or decals in the tub or shower.  If you need to sit down in the shower, use a plastic, non-slip stool.  Keep the floor dry. Clean up any water that spills on the floor as soon as it happens.  Remove soap buildup in the tub or shower regularly.  Attach bath mats securely with double-sided non-slip rug tape.  Do not have throw rugs and other things on the floor that can make you trip. What can I do in the bedroom?  Use night lights.  Make sure that you have a light by your bed that is easy to reach.  Do not use any sheets or blankets that are too big for your bed. They should not hang down onto the floor.  Have a firm chair that has side arms. You can use this for support while you get dressed.  Do not have throw rugs and other things on the floor that can make you trip. What can I do in the kitchen?  Clean up any spills right away.  Avoid walking on wet floors.  Keep items that you use a lot in easy-to-reach places.  If you need to reach something above you, use a strong step stool that has a grab bar.  Keep electrical cords out of the way.  Do not use floor polish or wax that makes floors slippery. If you must use wax, use non-skid floor wax.  Do not have throw rugs and other things on the floor that can make you trip. What can I do with my stairs?  Do not leave any items on the stairs.  Make sure that there are  handrails on both sides of the stairs and use them. Fix handrails that are broken or loose. Make sure that handrails are as long as the stairways.  Check any carpeting to make sure that it is firmly attached to the stairs. Fix any carpet that is loose or worn.  Avoid having throw rugs at the top or bottom of the stairs. If you do have throw rugs, attach them to the floor with carpet tape.  Make sure that you have a light switch at the top of the stairs and the bottom of the stairs. If  you do not have them, ask someone to add them for you. What else can I do to help prevent falls?  Wear shoes that:  Do not have high heels.  Have rubber bottoms.  Are comfortable and fit you well.  Are closed at the toe. Do not wear sandals.  If you use a stepladder:  Make sure that it is fully opened. Do not climb a closed stepladder.  Make sure that both sides of the stepladder are locked into place.  Ask someone to hold it for you, if possible.  Clearly mark and make sure that you can see:  Any grab bars or handrails.  First and last steps.  Where the edge of each step is.  Use tools that help you move around (mobility aids) if they are needed. These include:  Canes.  Walkers.  Scooters.  Crutches.  Turn on the lights when you go into a dark area. Replace any light bulbs as soon as they burn out.  Set up your furniture so you have a clear path. Avoid moving your furniture around.  If any of your floors are uneven, fix them.  If there are any pets around you, be aware of where they are.  Review your medicines with your doctor. Some medicines can make you feel dizzy. This can increase your chance of falling. Ask your doctor what other things that you can do to help prevent falls. This information is not intended to replace advice given to you by your health care provider. Make sure you discuss any questions you have with your health care provider. Document Released: 11/10/2008  Document Revised: 06/22/2015 Document Reviewed: 02/18/2014  2017 Elsevier

## 2016-01-02 NOTE — Progress Notes (Signed)
Subjective:   Tamara [name] is a 82 y.o. female who presents for an Initial Medicare Annual Wellness Visit.  Review of Systems    No ROS.  Medicare Wellness Visit.  Cardiac Risk Factors include: advanced age (>52men, >28 women);hypertension     Objective:    Today's Vitals   01/02/16 1134  BP: 122/62  Pulse: 68  Resp: 14  Temp: 97.8 F (36.6 C)  TempSrc: Oral  SpO2: 96%  Weight: 138 lb 6.4 oz (62.8 kg)  Height: 5' 3.5" (1.613 m)   Body mass index is 24.13 kg/m.   Current Medications (verified) Outpatient Encounter Prescriptions as of 01/02/2016  Medication Sig  . aspirin 81 MG tablet Take 81 mg by mouth daily.  . clopidogrel (PLAVIX) 75 MG tablet Take 1 tablet (75 mg total) by mouth daily.  [name] PARoxetine (PAXIL) 30 MG tablet Take 1 tablet (30 mg total) by mouth daily.  . Probiotic Product (PROBIOTIC PO) Take 1 capsule by mouth daily.  . rosuvastatin (CRESTOR) 40 MG tablet Take 1 tablet (40 mg total) by mouth daily.  . temazepam (RESTORIL) 15 MG capsule Take 1 capsule (15 mg total) by mouth at bedtime.  [name] losartan (COZAAR) 50 MG tablet TAKE 1 TABLET EVERY DAY (Patient not taking: Reported on 01/02/2016)  . [DISCONTINUED] lovastatin (MEVACOR) 40 MG tablet TAKE 1 TABLET (40 MG TOTAL) BY MOUTH AT BEDTIME.  . [DISCONTINUED] mupirocin ointment (BACTROBAN) 2 % Place 1 application into the nose 2 (two) times daily.   No facility-administered encounter medications on file as of 01/02/2016.     Allergies (verified) Flagyl [metronidazole]; Other; Prednisone; Shrimp [shellfish allergy]; and Vicodin [hydrocodone-acetaminophen]   History: Past Medical History:  Diagnosis Date  . Anemia   . Arthritis   . Chicken pox   . Colitis   . Degenerative joint disease   . Diverticulitis   . Diverticulosis   . Fibrocystic breast disease   . GERD (gastroesophageal reflux disease)    EGD - gastritis/mild duodenitis, previous positive H. pylori  . Glaucoma   . Heart murmur   .  Helicobacter pylori ab+   . History of depression   . History of skin cancer    facial  . Hypercholesterolemia   . Hypertension   . IBS (irritable bowel syndrome)   . Internal hemorrhoids   . Migraines   . Osteopenia   . Stroke ([place])   . Tubular adenoma of colon   . Vitamin D deficiency    Past Surgical History:  Procedure Laterality Date  . ABDOMINAL HYSTERECTOMY  1973   secondary to bleeding  . APPENDECTOMY    . BILATERAL SALPINGOOPHORECTOMY  2004   complex ovarian cyst (left) - benign  . BREAST BIOPSY Bilateral    x4  . MANDIBLE FRACTURE SURGERY    . VENTRAL HERNIA REPAIR  2005   Dr. Tamala Julian   Family History  Problem Relation Age of Onset  . Esophageal cancer Mother   . Stomach cancer Mother   . Heart disease Father   . Cirrhosis Brother   . Parkinson's disease Brother   . Ovarian cancer Sister   . Colon cancer Sister     half-sister   Social History   Occupational History  . retired    Social History Main Topics  . Smoking status: Never Smoker  . Smokeless tobacco: Never Used  . Alcohol use No  . Drug use: No  . Sexual activity: No    Tobacco Counseling Counseling given: Not  Answered   Activities of Daily Living In your present state of health, do you have any difficulty performing the following activities: 01/02/2016  Hearing? Y  Vision? N  Difficulty concentrating or making decisions? Y  Walking or climbing stairs? Y  Dressing or bathing? N  Doing errands, shopping? N  Preparing Food and eating ? N  Using the Toilet? N  In the past six months, have you accidently leaked urine? N  Do you have problems with loss of bowel control? N  Managing your Medications? N  Managing your Finances? N  Housekeeping or managing your Housekeeping? N  Some recent data might be hidden    Immunizations and Health Maintenance  There is no immunization history on file for this patient. Health Maintenance Due  Topic Date Due  . FOOT EXAM  01/24/1940  .  OPHTHALMOLOGY EXAM  01/24/1940  . TETANUS/TDAP  01/23/1949  . ZOSTAVAX  01/23/1990  . DEXA SCAN  01/24/1995  . PNA vac Low Risk Adult (1 of 2 - PCV13) 01/24/1995    Patient Care Team: Einar Pheasant, MD as PCP - General (Internal Medicine) Minna Merritts, MD as Consulting Physician (Cardiology)  Indicate any recent Medical Services you may have received from other than [facility] providers in the past year (date may be approximate).     Assessment:   This is a routine wellness examination for Tamara [name]. The goal of the wellness visit is to assist the patient how to close the gaps in care and create a preventative care plan for the patient.   Osteoporosis risk reviewed.  DEXA SCAN deferred per patient request.  Educational material provided.  Medications reviewed; taking without issues or barriers.  Safety issues reviewed; lives alone.  Med/life alert system and smoke detectors in the home. No firearms in the home. Wears seatbelts when driving or riding with others. No violence in the home.  No identified risk were noted; The patient was oriented x 3; appropriate in dress and manner and no objective failures at ADL's or IADL's.   BMI; normal.  Discussed the importance of a healthy diet, water intake and exercise. She has a sensible diet, walking for exercise and plans to increase her water intake.  Educational material provided.  PCV13/PNA23, TDAP and ZOSTAVAX vaccine postponed per patient request.  Educational material provided.  Dentist; OV every 9 months.  Patient Concerns: None at this time. Follow up with PCP as needed.  Hearing/Vision screen Hearing Screening Comments: Difficulty hearing a whisper Difficulty hearing conversation in a crowded room Does not wear hearing aids Audiology testing deferred per patient request Vision Screening Comments: Followed by [facility] (Dr. Jeni Salles) Last OV 2016 Vision screening deferred per patient request Annual visits R  eye glaucoma Bilateral cataracts extracted Wears reading glasses only  Dietary issues and exercise activities discussed: Current Exercise Habits: Home exercise routine, Type of exercise: walking, Time (Minutes): 30, Frequency (Times/Week): 2, Weekly Exercise (Minutes/Week): 60, Intensity: Mild  Goals    . Increase water intake          Stay hydrated and drink plenty of fluids.  Increase water intake by a minimum of 1 cup daily.      Depression Screen PHQ 2/9 Scores 01/02/2016 12/15/2015 11/07/2015 08/10/2014 05/06/2013 04/26/2012 04/23/2012  PHQ - 2 Score 0 0 0 0 0 0 0    Fall Risk Fall Risk  01/02/2016 12/15/2015 11/07/2015 08/10/2014 05/06/2013  Falls in the past year? Yes No No No No  Number falls in past  yr: 1 - - - -  Follow up Education provided;Falls prevention discussed - - - -    Cognitive Function:     6CIT Screen 01/02/2016  What Year? 0 points  What month? 0 points  What time? 0 points  Count back from 20 0 points  Months in reverse 0 points  Repeat phrase 0 points  Total Score 0    Screening Tests Health Maintenance  Topic Date Due  . FOOT EXAM  01/24/1940  . OPHTHALMOLOGY EXAM  01/24/1940  . TETANUS/TDAP  01/23/1949  . ZOSTAVAX  01/23/1990  . DEXA SCAN  01/24/1995  . PNA vac Low Risk Adult (1 of 2 - PCV13) 01/24/1995  . INFLUENZA VACCINE  03/28/2016 (Originally 08/29/2015)  . MAMMOGRAM  02/24/2016  . HEMOGLOBIN A1C  06/10/2016  . COLONOSCOPY  04/21/2017      Plan:    End of life planning; Advance aging; Advanced directives discussed. Copy of current HCPOA/Living Will requested.  Medicare Attestation I have personally reviewed: The patient's medical and social history Their use of alcohol, tobacco or illicit drugs Their current medications and supplements The patient's functional ability including ADLs,fall risks, home safety risks, cognitive, and hearing and visual impairment Diet and physical activities Evidence for depression   The patient's  weight, height, BMI, and visual acuity have been recorded in the chart.  I have made referrals and provided education to the patient based on review of the above and I have provided the patient with a written personalized care plan for preventive services.    During the course of the visit, Shalana was educated and counseled about the following appropriate screening and preventive services:   Vaccines to include Pneumoccal, Influenza, Hepatitis B, Td, Zostavax, HCV  Electrocardiogram  Cardiovascular disease screening  Colorectal cancer screening  Bone density screening  Diabetes screening  Glaucoma screening  Mammography/PAP  Nutrition counseling  Smoking cessation counseling  Patient Instructions (the written plan) were given to the patient.    Varney Biles, LPN   [id number]    Reviewed above information.  Agree with plan.  Dr Nicki Reaper

## 2016-02-19 ENCOUNTER — Other Ambulatory Visit: Payer: Self-pay | Admitting: Cardiovascular Disease

## 2016-02-19 ENCOUNTER — Other Ambulatory Visit: Payer: Self-pay | Admitting: Internal Medicine

## 2016-04-15 ENCOUNTER — Encounter: Payer: Self-pay | Admitting: Internal Medicine

## 2016-04-15 ENCOUNTER — Ambulatory Visit (INDEPENDENT_AMBULATORY_CARE_PROVIDER_SITE_OTHER): Payer: Medicare Other | Admitting: Internal Medicine

## 2016-04-15 DIAGNOSIS — F32A Depression, unspecified: Secondary | ICD-10-CM

## 2016-04-15 DIAGNOSIS — I10 Essential (primary) hypertension: Secondary | ICD-10-CM

## 2016-04-15 DIAGNOSIS — R739 Hyperglycemia, unspecified: Secondary | ICD-10-CM

## 2016-04-15 DIAGNOSIS — F329 Major depressive disorder, single episode, unspecified: Secondary | ICD-10-CM

## 2016-04-15 DIAGNOSIS — I2510 Atherosclerotic heart disease of native coronary artery without angina pectoris: Secondary | ICD-10-CM | POA: Diagnosis not present

## 2016-04-15 DIAGNOSIS — I700 Atherosclerosis of aorta: Secondary | ICD-10-CM

## 2016-04-15 DIAGNOSIS — Z8673 Personal history of transient ischemic attack (TIA), and cerebral infarction without residual deficits: Secondary | ICD-10-CM | POA: Diagnosis not present

## 2016-04-15 DIAGNOSIS — R0989 Other specified symptoms and signs involving the circulatory and respiratory systems: Secondary | ICD-10-CM

## 2016-04-15 DIAGNOSIS — E7800 Pure hypercholesterolemia, unspecified: Secondary | ICD-10-CM

## 2016-04-15 MED ORDER — TEMAZEPAM 15 MG PO CAPS: 15.0000 mg | ORAL_CAPSULE | Freq: Every day | ORAL | 0 refills | Status: DC

## 2016-04-15 MED ORDER — PAROXETINE HCL 30 MG PO TABS: 30.0000 mg | ORAL_TABLET | Freq: Every day | ORAL | 1 refills | Status: DC

## 2016-04-15 NOTE — Progress Notes (Addendum)
Patient ID: Tamara [name], female   DOB: 1927-12-22, 83 y.o.   MRN: [id number]   Subjective:    Patient ID: Tamara [name], female    DOB: May 26, 1927, 83 y.o.   MRN: [id number]  HPI  Patient here for a scheduled follow up.  She has been under increased stress recently.  One of her best friends passed away and also a young neighbor (who was like a grandson).  Discussed with her today.  Overall she feels she is handling things ok.  Does not feel needs anything more at this time.  No chest pain.  No sob.  No acid reflux.  No abdominal pain.  Bowel moving.  Overall she feels things are stable.  Not monitoring her blood pressure.     Past Medical History:  Diagnosis Date  . Anemia   . Arthritis   . Chicken pox   . Colitis   . Degenerative joint disease   . Diverticulitis   . Diverticulosis   . Fibrocystic breast disease   . GERD (gastroesophageal reflux disease)    EGD - gastritis/mild duodenitis, previous positive H. pylori  . Glaucoma   . Heart murmur   . Helicobacter pylori ab+   . History of depression   . History of skin cancer    facial  . Hypercholesterolemia   . Hypertension   . IBS (irritable bowel syndrome)   . Internal hemorrhoids   . Migraines   . Osteopenia   . Stroke ([place])   . Tubular adenoma of colon   . Vitamin D deficiency    Past Surgical History:  Procedure Laterality Date  . ABDOMINAL HYSTERECTOMY  1973   secondary to bleeding  . APPENDECTOMY    . BILATERAL SALPINGOOPHORECTOMY  2004   complex ovarian cyst (left) - benign  . BREAST BIOPSY Bilateral    x4  . MANDIBLE FRACTURE SURGERY    . VENTRAL HERNIA REPAIR  2005   Dr. Tamala Julian   Family History  Problem Relation Age of Onset  . Esophageal cancer Mother   . Stomach cancer Mother   . Heart disease Father   . Cirrhosis Brother   . Parkinson's disease Brother   . Ovarian cancer Sister   . Colon cancer Sister     half-sister   Social History   Social History  . Marital status: Widowed   Spouse name: N/A  . Number of children: 2  . Years of education: N/A   Occupational History  . retired    Social History Main Topics  . Smoking status: Never Smoker  . Smokeless tobacco: Never Used  . Alcohol use No  . Drug use: No  . Sexual activity: No   Other Topics Concern  . None   Social History Narrative  . None    Outpatient Encounter Prescriptions as of 04/15/2016  Medication Sig  . aspirin 81 MG tablet Take 81 mg by mouth daily.  . clopidogrel (PLAVIX) 75 MG tablet TAKE 1 TABLET EVERY DAY  . losartan (COZAAR) 50 MG tablet TAKE 1 TABLET EVERY DAY  . PARoxetine (PAXIL) 30 MG tablet Take 1 tablet (30 mg total) by mouth daily.  . Probiotic Product (PROBIOTIC PO) Take 1 capsule by mouth daily.  . rosuvastatin (CRESTOR) 40 MG tablet Take 1 tablet (40 mg total) by mouth daily.  . temazepam (RESTORIL) 15 MG capsule Take 1 capsule (15 mg total) by mouth at bedtime.  . [DISCONTINUED] PARoxetine (PAXIL) 30 MG tablet TAKE 1 TABLET EVERY  DAY  . [DISCONTINUED] temazepam (RESTORIL) 15 MG capsule Take 1 capsule (15 mg total) by mouth at bedtime.   No facility-administered encounter medications on file as of 04/15/2016.     Review of Systems  Constitutional: Negative for appetite change and unexpected weight change.  HENT: Negative for congestion and sinus pressure.   Respiratory: Negative for cough, chest tightness and shortness of breath.   Cardiovascular: Negative for chest pain, palpitations and leg swelling.  Gastrointestinal: Negative for abdominal pain, diarrhea, nausea and vomiting.  Genitourinary: Negative for difficulty urinating and dysuria.  Musculoskeletal: Negative for joint swelling and myalgias.  Skin: Negative for color change and rash.  Neurological: Negative for dizziness, light-headedness and headaches.  Psychiatric/Behavioral:       Increased stress as outlined.         Objective:     Blood pressure rechecked by me:  132/78  Physical Exam    Constitutional: She appears well-developed and well-nourished. No distress.  HENT:  Nose: Nose normal.  Mouth/Throat: Oropharynx is clear and moist.  Neck: Neck supple. No thyromegaly present.  Cardiovascular: Normal rate and regular rhythm.   1/6 systolic murmur.    Pulmonary/Chest: Breath sounds normal. No respiratory distress. She has no wheezes.  Abdominal: Soft. Bowel sounds are normal. There is no tenderness.  Musculoskeletal: She exhibits no edema or tenderness.  Lymphadenopathy:    She has no cervical adenopathy.  Skin: No rash noted. No erythema.  Psychiatric: She has a normal mood and affect. Her behavior is normal.    BP 120/60 (BP Location: Left Arm, Patient Position: Sitting, Cuff Size: Normal)   Pulse 74   Temp 98.7 F (37.1 C) (Oral)   Resp 16   Ht 5' 4" (1.626 m)   Wt 141 lb 12.8 oz (64.3 kg)   LMP 01/17/1972   SpO2 97%   BMI 24.34 kg/m  Wt Readings from Last 3 Encounters:  04/15/16 141 lb 12.8 oz (64.3 kg)  01/02/16 138 lb 6.4 oz (62.8 kg)  12/15/15 139 lb 3.2 oz (63.1 kg)     Lab Results  Component Value Date   WBC 6.1 08/29/2015   HGB 12.1 08/29/2015   HCT 36.2 08/29/2015   PLT 358.0 08/29/2015   GLUCOSE 96 12/12/2015   CHOL 182 12/12/2015   TRIG 73.0 12/12/2015   HDL 74.40 12/12/2015   LDLDIRECT 177.7 01/19/2013   LDLCALC 93 12/12/2015   ALT 16 12/12/2015   AST 21 12/12/2015   NA 140 12/12/2015   K 4.3 12/12/2015   CL 104 12/12/2015   CREATININE 0.69 12/12/2015   BUN 12 12/12/2015   CO2 30 12/12/2015   TSH 3.69 04/21/2015   HGBA1C 6.0 12/12/2015       Assessment & Plan:   Problem List Items Addressed This Visit    Aortic atherosclerosis ([place])    On crestor.        CAD (coronary artery disease)    Continue risk factor modification.        Depression    Increased stress as outlined.  On paxil.  Discussed with her today. She feels she is handling things relatively well.  Does not feel needs any further intervention.  Follow.         Relevant Medications   PARoxetine (PAXIL) 30 MG tablet   History of CVA (cerebrovascular accident)    On aspirin and plavix.  Doing well.  Follow.        Hypercholesterolemia    Low cholesterol diet and  exercise.  Follow lipid panel  On crestor.        Relevant Orders   Hepatic function panel   Lipid panel   Hyperglycemia    Low carb diet and exercise.  Follow met b and a1c.        Relevant Orders   Hemoglobin A1c   Hypertension    Blood pressure under good control.  Continue same medication regimen.  Follow pressures.  Follow metabolic panel.        Relevant Orders   TSH   Basic metabolic panel   Left carotid bruit    On aspirin and plavix.  Follow.          Addendum - left neck nodule/fullness.  Obtain ultrasound.     Einar Pheasant, MD

## 2016-04-21 ENCOUNTER — Encounter: Payer: Self-pay | Admitting: Internal Medicine

## 2016-04-21 NOTE — Assessment & Plan Note (Signed)
Low cholesterol diet and exercise.  Follow lipid panel.  On crestor.   

## 2016-04-21 NOTE — Assessment & Plan Note (Signed)
Continue risk factor modification 

## 2016-04-21 NOTE — Assessment & Plan Note (Signed)
On crestor.   

## 2016-04-21 NOTE — Assessment & Plan Note (Signed)
Increased stress as outlined.  On paxil.  Discussed with her today. She feels she is handling things relatively well.  Does not feel needs any further intervention.  Follow.

## 2016-04-21 NOTE — Assessment & Plan Note (Signed)
Low carb diet and exercise.  Follow met b and a1c.   

## 2016-04-21 NOTE — Assessment & Plan Note (Signed)
Blood pressure under good control.  Continue same medication regimen.  Follow pressures.  Follow metabolic panel.   

## 2016-04-21 NOTE — Assessment & Plan Note (Signed)
On aspirin and plavix.  Doing well.  Follow.

## 2016-04-21 NOTE — Assessment & Plan Note (Signed)
On aspirin and plavix.  Follow.  

## 2016-05-04 ENCOUNTER — Other Ambulatory Visit: Payer: Self-pay | Admitting: Cardiovascular Disease

## 2016-05-08 ENCOUNTER — Other Ambulatory Visit: Payer: Self-pay | Admitting: Cardiovascular Disease

## 2016-05-08 MED ORDER — ROSUVASTATIN CALCIUM 40 MG PO TABS: 40.0000 mg | ORAL_TABLET | Freq: Every day | ORAL | 2 refills | Status: DC

## 2016-05-09 ENCOUNTER — Other Ambulatory Visit (INDEPENDENT_AMBULATORY_CARE_PROVIDER_SITE_OTHER): Payer: Medicare Other

## 2016-05-09 DIAGNOSIS — I10 Essential (primary) hypertension: Secondary | ICD-10-CM | POA: Diagnosis not present

## 2016-05-09 DIAGNOSIS — R739 Hyperglycemia, unspecified: Secondary | ICD-10-CM

## 2016-05-09 DIAGNOSIS — E7800 Pure hypercholesterolemia, unspecified: Secondary | ICD-10-CM | POA: Diagnosis not present

## 2016-05-09 LAB — BASIC METABOLIC PANEL
BUN: 16 mg/dL (ref 6–23)
CALCIUM: 9.7 mg/dL (ref 8.4–10.5)
CHLORIDE: 106 meq/L (ref 96–112)
CO2: 30 meq/L (ref 19–32)
CREATININE: 0.77 mg/dL (ref 0.40–1.20)
GFR: 75.49 mL/min (ref >60.00)
Glucose, Bld: 98 mg/dL (ref 70–99)
POTASSIUM: 5.1 meq/L (ref 3.5–5.1)
SODIUM: 141 meq/L (ref 135–145)

## 2016-05-09 LAB — HEPATIC FUNCTION PANEL
ALBUMIN: 4.2 g/dL (ref 3.5–5.2)
ALK PHOS: 64 U/L (ref 39–117)
ALT: 11 U/L (ref 0–35)
AST: 18 U/L (ref 0–37)
Bilirubin, Direct: 0.1 mg/dL (ref 0.0–0.3)
Total Bilirubin: 0.4 mg/dL (ref 0.2–1.2)
Total Protein: 6.9 g/dL (ref 6.0–8.3)

## 2016-05-09 LAB — HEMOGLOBIN A1C: Hgb A1c MFr Bld: 6.2 % (ref 4.6–6.5)

## 2016-05-09 LAB — LIPID PANEL
CHOLESTEROL: 177 mg/dL (ref 0–200)
HDL: 71.8 mg/dL (ref >39.00)
LDL Cholesterol: 92 mg/dL (ref 0–99)
NONHDL: 105.49
Total CHOL/HDL Ratio: 2
Triglycerides: 69 mg/dL (ref 0.0–149.0)
VLDL: 13.8 mg/dL (ref 0.0–40.0)

## 2016-05-09 LAB — TSH: TSH: 5.21 u[IU]/mL — AB (ref 0.35–4.50)

## 2016-05-13 ENCOUNTER — Telehealth: Payer: Self-pay

## 2016-05-13 ENCOUNTER — Other Ambulatory Visit: Payer: Self-pay | Admitting: Internal Medicine

## 2016-05-13 DIAGNOSIS — R7989 Other specified abnormal findings of blood chemistry: Secondary | ICD-10-CM

## 2016-05-13 DIAGNOSIS — E875 Hyperkalemia: Secondary | ICD-10-CM

## 2016-05-13 NOTE — Telephone Encounter (Signed)
-----   Message from Einar Pheasant, MD sent at 05/13/2016  5:08 AM EDT ----- Notify pt that her cholesterol levels look good.  Overall sugar control stable.  Thyroid test slightly elevated.  Can be just a variant.  All we need to do at this point is to recheck in the next 4-6 weeks.  Schedule non fasting lab in 4 weeks.  Kidney function tests and liver function tests are wnl.  Also, make sure she is not taking any potassium supplements or using an increased amount of salt substitutes.

## 2016-05-13 NOTE — Progress Notes (Signed)
Orders placed for f/u labs.  

## 2016-05-13 NOTE — Telephone Encounter (Signed)
Left message to return call to our office.  

## 2016-05-14 NOTE — Telephone Encounter (Signed)
Pt called back returning your call. Thank you!  Call pt @ [phone]

## 2016-05-14 NOTE — Telephone Encounter (Signed)
Left message to return call to our office.  

## 2016-05-14 NOTE — Telephone Encounter (Signed)
Spoke with patient all information given. I have made lab app. She is not taking any supplements or salt alternatives

## 2016-06-03 ENCOUNTER — Telehealth: Payer: Self-pay | Admitting: Internal Medicine

## 2016-06-03 DIAGNOSIS — R221 Localized swelling, mass and lump, neck: Secondary | ICD-10-CM

## 2016-06-03 NOTE — Telephone Encounter (Signed)
Lump on left side of neck,  patient daughter stated it is not visible to her, I see where a carotid doppler was ordered 05/31/15 but none for lump on left side of neck and I do not see results for carotid doppler either could the carotid doppler be what patient daughter is stating patient is referring to.

## 2016-06-03 NOTE — Telephone Encounter (Signed)
Pt daughter called and stated that on pt's last visit she stated that she had a lump on her neck, and that Dr. Nicki Reaper was going to put in an order for an u/s. There is no notation of this on in office notes. Please advise, thank you!

## 2016-06-03 NOTE — Telephone Encounter (Signed)
Order placed for ultrasound of neck.  Please schedule.   Thanks

## 2016-06-06 ENCOUNTER — Ambulatory Visit: Payer: Medicare Other

## 2016-06-06 ENCOUNTER — Other Ambulatory Visit: Payer: Self-pay | Admitting: Internal Medicine

## 2016-06-07 ENCOUNTER — Other Ambulatory Visit: Payer: Self-pay | Admitting: Internal Medicine

## 2016-06-07 ENCOUNTER — Ambulatory Visit
Admission: RE | Admit: 2016-06-07 | Discharge: 2016-06-07 | Disposition: A | Discharge status: Home / Self Care | Payer: Medicare Other | Source: Ambulatory Visit | Attending: Internal Medicine | Admitting: Internal Medicine

## 2016-06-07 DIAGNOSIS — E042 Nontoxic multinodular goiter: Secondary | ICD-10-CM | POA: Insufficient documentation

## 2016-06-07 DIAGNOSIS — E041 Nontoxic single thyroid nodule: Secondary | ICD-10-CM | POA: Diagnosis not present

## 2016-06-07 DIAGNOSIS — R221 Localized swelling, mass and lump, neck: Secondary | ICD-10-CM

## 2016-06-11 ENCOUNTER — Other Ambulatory Visit (INDEPENDENT_AMBULATORY_CARE_PROVIDER_SITE_OTHER): Payer: Medicare Other

## 2016-06-11 DIAGNOSIS — R7989 Other specified abnormal findings of blood chemistry: Secondary | ICD-10-CM

## 2016-06-11 DIAGNOSIS — E875 Hyperkalemia: Secondary | ICD-10-CM | POA: Diagnosis not present

## 2016-06-11 DIAGNOSIS — R946 Abnormal results of thyroid function studies: Secondary | ICD-10-CM

## 2016-06-11 LAB — POTASSIUM: Potassium: 4.6 mEq/L (ref 3.5–5.1)

## 2016-06-11 LAB — TSH: TSH: 3.2 u[IU]/mL (ref 0.35–4.50)

## 2016-06-13 ENCOUNTER — Telehealth: Payer: Self-pay | Admitting: *Deleted

## 2016-06-13 NOTE — Telephone Encounter (Signed)
Daughter called back  

## 2016-06-13 NOTE — Telephone Encounter (Signed)
Left message to return call to our office.  

## 2016-06-13 NOTE — Telephone Encounter (Signed)
Patients daughter requested Xray results [place] [phone]

## 2016-06-14 ENCOUNTER — Other Ambulatory Visit: Payer: Self-pay | Admitting: Internal Medicine

## 2016-06-14 DIAGNOSIS — E041 Nontoxic single thyroid nodule: Secondary | ICD-10-CM

## 2016-06-14 NOTE — Progress Notes (Signed)
Orders placed for endocrinology referral.

## 2016-07-08 DIAGNOSIS — H353131 Nonexudative age-related macular degeneration, bilateral, early dry stage: Secondary | ICD-10-CM | POA: Diagnosis not present

## 2016-07-24 ENCOUNTER — Other Ambulatory Visit: Payer: Self-pay | Admitting: Internal Medicine

## 2016-07-24 ENCOUNTER — Other Ambulatory Visit: Payer: Self-pay

## 2016-07-24 MED ORDER — TEMAZEPAM 15 MG PO CAPS: 15.0000 mg | ORAL_CAPSULE | Freq: Every evening | ORAL | 0 refills | Status: DC | PRN

## 2016-07-24 NOTE — Telephone Encounter (Signed)
I have ok'd rx for temazepam #90 with no refills.  If wants sent to mail order, she will need some sent to local pharmacy until her mail order arrives.  Please confirm with pt wants sent in to mail order.

## 2016-07-24 NOTE — Telephone Encounter (Signed)
Pt called requesting a refill on her temazepam (RESTORIL) 15 MG capsule. Pt only has one pill left. Please advise, thank you!  Call pt @ [place], [place]

## 2016-07-24 NOTE — Telephone Encounter (Signed)
Last o/v 04/15/16 F/u with you 08/06/16  Last script 04/15/16 #90 with no refills.

## 2016-07-25 MED ORDER — TEMAZEPAM 15 MG PO CAPS: 15.0000 mg | ORAL_CAPSULE | Freq: Every evening | ORAL | 0 refills | Status: DC | PRN

## 2016-07-25 NOTE — Telephone Encounter (Signed)
Left message to return call to our office.  

## 2016-07-25 NOTE — Telephone Encounter (Signed)
Pt called back returning your call. Thank you! °

## 2016-07-25 NOTE — Telephone Encounter (Signed)
faxed

## 2016-07-25 NOTE — Telephone Encounter (Signed)
Patient would like 7 day supply called in to [facility]

## 2016-07-25 NOTE — Telephone Encounter (Signed)
Temazepam #7 printed.

## 2016-08-06 ENCOUNTER — Ambulatory Visit (INDEPENDENT_AMBULATORY_CARE_PROVIDER_SITE_OTHER): Payer: Medicare Other | Admitting: Internal Medicine

## 2016-08-06 ENCOUNTER — Encounter: Payer: Self-pay | Admitting: Internal Medicine

## 2016-08-06 DIAGNOSIS — I6523 Occlusion and stenosis of bilateral carotid arteries: Secondary | ICD-10-CM

## 2016-08-06 DIAGNOSIS — E041 Nontoxic single thyroid nodule: Secondary | ICD-10-CM

## 2016-08-06 DIAGNOSIS — F32A Depression, unspecified: Secondary | ICD-10-CM

## 2016-08-06 DIAGNOSIS — R739 Hyperglycemia, unspecified: Secondary | ICD-10-CM | POA: Diagnosis not present

## 2016-08-06 DIAGNOSIS — F329 Major depressive disorder, single episode, unspecified: Secondary | ICD-10-CM | POA: Diagnosis not present

## 2016-08-06 DIAGNOSIS — Z8673 Personal history of transient ischemic attack (TIA), and cerebral infarction without residual deficits: Secondary | ICD-10-CM | POA: Diagnosis not present

## 2016-08-06 DIAGNOSIS — I10 Essential (primary) hypertension: Secondary | ICD-10-CM

## 2016-08-06 DIAGNOSIS — I498 Other specified cardiac arrhythmias: Secondary | ICD-10-CM | POA: Diagnosis not present

## 2016-08-06 DIAGNOSIS — I700 Atherosclerosis of aorta: Secondary | ICD-10-CM

## 2016-08-06 DIAGNOSIS — E7800 Pure hypercholesterolemia, unspecified: Secondary | ICD-10-CM

## 2016-08-06 DIAGNOSIS — Z0000 Encounter for general adult medical examination without abnormal findings: Secondary | ICD-10-CM | POA: Diagnosis not present

## 2016-08-06 DIAGNOSIS — I2510 Atherosclerotic heart disease of native coronary artery without angina pectoris: Secondary | ICD-10-CM | POA: Diagnosis not present

## 2016-08-06 MED ORDER — LOSARTAN POTASSIUM 50 MG PO TABS: 50.0000 mg | ORAL_TABLET | Freq: Every day | ORAL | 1 refills | Status: DC

## 2016-08-06 MED ORDER — PAROXETINE HCL 30 MG PO TABS: 30.0000 mg | ORAL_TABLET | Freq: Every day | ORAL | 1 refills | Status: DC

## 2016-08-06 NOTE — Progress Notes (Signed)
Pre-visit discussion using our clinic review tool. No additional management support is needed unless otherwise documented below in the visit note.  

## 2016-08-06 NOTE — Assessment & Plan Note (Addendum)
Physical today 08/06/16.  She declines mammogram.

## 2016-08-06 NOTE — Progress Notes (Signed)
Patient ID: Tamara [name], female   DOB: 07/17/1927, 83 y.o.   MRN: [id number]   Subjective:    Patient ID: Tamara [name], female    DOB: 07/07/1927, 83 y.o.   MRN: [id number]  HPI  Patient with past history of GERD, diverticulosis, hypertension and hypercholesterolemia.  She comes in today to follow up on these issues as well as for a complete physical exam.  She reports she is doing well.  Feels good.  Eating.  No nausea or vomiting.  No abdominal pain.  Bowels moving.  Thyroid nodule.  Notices some when swallowing.  Had appt with endocrinology 08/16/16.  States her daughter checks her blood pressure and has been ok.     Past Medical History:  Diagnosis Date  . Anemia   . Arthritis   . Chicken pox   . Colitis   . Degenerative joint disease   . Diverticulitis   . Diverticulosis   . Fibrocystic breast disease   . GERD (gastroesophageal reflux disease)    EGD - gastritis/mild duodenitis, previous positive H. pylori  . Glaucoma   . Heart murmur   . Helicobacter pylori ab+   . History of depression   . History of skin cancer    facial  . Hypercholesterolemia   . Hypertension   . IBS (irritable bowel syndrome)   . Internal hemorrhoids   . Migraines   . Osteopenia   . Stroke (HCC)   . Tubular adenoma of colon   . Vitamin D deficiency    Past Surgical History:  Procedure Laterality Date  . ABDOMINAL HYSTERECTOMY  1973   secondary to bleeding  . APPENDECTOMY    . BILATERAL SALPINGOOPHORECTOMY  2004   complex ovarian cyst (left) - benign  . BREAST BIOPSY Bilateral    x4  . MANDIBLE FRACTURE SURGERY    . VENTRAL HERNIA REPAIR  2005   Dr. Birt   Family History  Problem Relation Age of Onset  . Esophageal cancer Mother   . Stomach cancer Mother   . Heart disease Father   . Cirrhosis Brother   . Parkinson's disease Brother   . Ovarian cancer Sister   . Colon cancer Sister        half-sister   Social History   Social History  . Marital status: Widowed    Spouse  name: N/A  . Number of children: 2  . Years of education: N/A   Occupational History  . retired    Social History Main Topics  . Smoking status: Never Smoker  . Smokeless tobacco: Never Used  . Alcohol use No  . Drug use: No  . Sexual activity: No   Other Topics Concern  . None   Social History Narrative  . None    Outpatient Encounter Prescriptions as of 08/06/2016  Medication Sig  . aspirin 81 MG tablet Take 81 mg by mouth daily.  . clopidogrel (PLAVIX) 75 MG tablet TAKE 1 TABLET EVERY DAY  . losartan (COZAAR) 50 MG tablet Take 1 tablet (50 mg total) by mouth daily.  . PARoxetine (PAXIL) 30 MG tablet Take 1 tablet (30 mg total) by mouth daily.  . rosuvastatin (CRESTOR) 40 MG tablet Take 1 tablet (40 mg total) by mouth daily.  . temazepam (RESTORIL) 15 MG capsule Take 1 capsule (15 mg total) by mouth at bedtime as needed for sleep.  . [DISCONTINUED] losartan (COZAAR) 50 MG tablet TAKE 1 TABLET EVERY DAY  . [DISCONTINUED] PARoxetine (PAXIL) 30 MG   tablet Take 1 tablet (30 mg total) by mouth daily.  . [DISCONTINUED] Probiotic Product (PROBIOTIC PO) Take 1 capsule by mouth daily.  . [DISCONTINUED] temazepam (RESTORIL) 15 MG capsule Take 1 capsule (15 mg total) by mouth at bedtime as needed for sleep.   No facility-administered encounter medications on file as of 08/06/2016.     Review of Systems  Constitutional: Negative for appetite change and unexpected weight change.  HENT: Negative for congestion and sinus pressure.   Eyes: Negative for pain and visual disturbance.  Respiratory: Negative for cough, chest tightness and shortness of breath.   Cardiovascular: Negative for chest pain, palpitations and leg swelling.  Gastrointestinal: Negative for abdominal pain, diarrhea, nausea and vomiting.  Genitourinary: Negative for difficulty urinating and dysuria.  Musculoskeletal: Negative for back pain and joint swelling.  Skin: Negative for color change and rash.  Neurological:  Negative for dizziness, light-headedness and headaches.  Hematological: Negative for adenopathy. Does not bruise/bleed easily.  Psychiatric/Behavioral: Negative for agitation and dysphoric mood.      Objective:    Physical Exam  Constitutional: She is oriented to person, place, and time. She appears well-developed and well-nourished. No distress.  HENT:  Nose: Nose normal.  Mouth/Throat: Oropharynx is clear and moist.  Eyes: Right eye exhibits no discharge. Left eye exhibits no discharge. No scleral icterus.  Neck: Neck supple. No thyromegaly present.  Cardiovascular: Normal rate and regular rhythm.   Pulmonary/Chest: Breath sounds normal. No accessory muscle usage. No tachypnea. No respiratory distress. She has no decreased breath sounds. She has no wheezes. She has no rhonchi. Right breast exhibits no inverted nipple, no mass, no nipple discharge and no tenderness (no axillary adenopathy). Left breast exhibits no inverted nipple, no mass, no nipple discharge and no tenderness (no axilarry adenopathy).  Abdominal: Soft. Bowel sounds are normal. There is no tenderness.  Musculoskeletal: She exhibits no edema or tenderness.  Lymphadenopathy:    She has no cervical adenopathy.  Neurological: She is alert and oriented to person, place, and time.  Skin: Skin is warm. No rash noted. No erythema.  Psychiatric: She has a normal mood and affect. Her behavior is normal.    BP 136/60 (BP Location: Left Arm, Patient Position: Sitting, Cuff Size: Normal)   Pulse 88   Temp 98.6 F (37 C) (Oral)   Resp 12   Ht 5' 4" (1.626 m)   Wt 144 lb 9.6 oz (65.6 kg)   LMP 01/17/1972   SpO2 99%   BMI 24.82 kg/m  Wt Readings from Last 3 Encounters:  08/06/16 144 lb 9.6 oz (65.6 kg)  04/15/16 141 lb 12.8 oz (64.3 kg)  01/02/16 138 lb 6.4 oz (62.8 kg)     Lab Results  Component Value Date   WBC 6.1 08/29/2015   HGB 12.1 08/29/2015   HCT 36.2 08/29/2015   PLT 358.0 08/29/2015   GLUCOSE 98  05/09/2016   CHOL 177 05/09/2016   TRIG 69.0 05/09/2016   HDL 71.80 05/09/2016   LDLDIRECT 177.7 01/19/2013   LDLCALC 92 05/09/2016   ALT 11 05/09/2016   AST 18 05/09/2016   NA 141 05/09/2016   K 4.6 06/11/2016   CL 106 05/09/2016   CREATININE 0.77 05/09/2016   BUN 16 05/09/2016   CO2 30 05/09/2016   TSH 3.20 06/11/2016   HGBA1C 6.2 05/09/2016    US Soft Tissue Head And Neck  Result Date: 06/07/2016 CLINICAL DATA:  Palpable abnormality.  Neck mass. EXAM: THYROID ULTRASOUND TECHNIQUE: Ultrasound examination of  the thyroid gland and adjacent soft tissues was performed. COMPARISON:  None. FINDINGS: Parenchymal Echotexture: Mildly heterogenous Isthmus: 0.2 cm Right lobe: 4.0 x 1.9 x 1.8 cm Left lobe: 4.0 x 1.2 x 1.4 cm _________________________________________________________ Estimated total number of nodules >/= 1 cm: 1 Number of spongiform nodules >/=  2 cm not described below (TR1): 0 Number of mixed cystic and solid nodules >/= 1.5 cm not described below (TR2): 0 _________________________________________________________ Nodule # 1: Location: Right; Superior Maximum size: 2.6 cm; Other 2 dimensions: 1.8 x 2.4 cm Composition: solid/almost completely solid (2) Echogenicity: very hypoechoic (3) Shape: not taller-than-wide (0) Margins: lobulated/irregular (2) Echogenic foci: none (0) ACR TI-RADS total points: 7. ACR TI-RADS risk category: TR5 (>/= 7 points). ACR TI-RADS recommendations: **Given size (>/= 1.0 cm) and appearance, fine needle aspiration of this highly suspicious nodule should be considered based on TI-RADS criteria. _________________________________________________________ Nodule # 2: Location: Isthmus Maximum size: 0.6 cm; Other 2 dimensions: 0.2 x 0.6 cm Composition: solid/almost completely solid (2) Echogenicity: hypoechoic (2) Shape: not taller-than-wide (0) Margins: smooth (0) Echogenic foci: none (0) ACR TI-RADS total points: 4. ACR TI-RADS risk category: TR4 (4-6 points). ACR  TI-RADS recommendations: Given size (<0.9 cm) and appearance, this nodule does NOT meet TI-RADS criteria for biopsy or dedicated follow-up. _________________________________________________________ Nodule # 3: Location: Left; Mid Maximum size: 0.5 cm; Other 2 dimensions: 0.3 x 0.5 cm Composition: solid/almost completely solid (2) Echogenicity: isoechoic (1) Shape: not taller-than-wide (0) Margins: ill-defined (0) Echogenic foci: macrocalcifications (1) ACR TI-RADS total points: 4. ACR TI-RADS risk category: TR4 (4-6 points). ACR TI-RADS recommendations: Given size (<0.9 cm) and appearance, this nodule does NOT meet TI-RADS criteria for biopsy or dedicated follow-up. _________________________________________________________ Additional vague nodular area in the anterior portion of the mid left lobe is felt to represent heterogeneous tissue and not a discrete nodule. IMPRESSION: 2.6 cm lobulated nodule in the superior right lobe is suspicious for malignancy and meets TI-RADS criteria for fine-needle aspiration. Small subcentimeter nodules in the isthmus and mid left lobe do not meet criteria for biopsy or further follow-up. The above is in keeping with the ACR TI-RADS recommendations - J Am Coll Radiol 2017;14:587-595. Electronically Signed   By: Glenn  Yamagata M.D.   On: 06/07/2016 16:25       Assessment & Plan:   Problem List Items Addressed This Visit    Aortic atherosclerosis (HCC)    On crestor.        Relevant Medications   losartan (COZAAR) 50 MG tablet   CAD (coronary artery disease)    Continue risk factor modification.        Relevant Medications   losartan (COZAAR) 50 MG tablet   Carotid stenosis    Previous carotid ultrasound - no significant stenosis.  On antiplatelet therapy.  Follow.        Relevant Medications   losartan (COZAAR) 50 MG tablet   Depression    On paxil and doing well.  Follow.        Relevant Medications   PARoxetine (PAXIL) 30 MG tablet   Fluttering heart      No significant issues now.  Has been worked up previously.  Feels doing well.  Follow.        Health care maintenance    Physical today 08/06/16.  She declines mammogram.        History of CVA (cerebrovascular accident)    On aspirin and plavix.  Follow.  Stable.       Hypercholesterolemia      On crestor.  Low cholesterol diet and exercise.  Follow lipid panel and liver function tests.        Relevant Medications   losartan (COZAAR) 50 MG tablet   Other Relevant Orders   Lipid panel   Hepatic function panel   Hyperglycemia    Low carb diet and exercise.  Follow met b and a1c.        Relevant Orders   Hemoglobin A1c   Hypertension    Blood pressure under good control.  Continue same medication regimen.  Follow pressures.  Follow metabolic panel.        Relevant Medications   losartan (COZAAR) 50 MG tablet   Other Relevant Orders   CBC with Differential/Platelet   TSH   Basic metabolic panel   Thyroid nodule    Ultrasound as outlined.  Has appt with endocrinology 08/16/16.            , , MD  

## 2016-08-07 ENCOUNTER — Encounter: Payer: Self-pay | Admitting: Internal Medicine

## 2016-08-07 DIAGNOSIS — Z85850 Personal history of malignant neoplasm of thyroid: Secondary | ICD-10-CM | POA: Insufficient documentation

## 2016-08-07 DIAGNOSIS — C73 Malignant neoplasm of thyroid gland: Secondary | ICD-10-CM | POA: Insufficient documentation

## 2016-08-07 NOTE — Assessment & Plan Note (Signed)
On aspirin and plavix.  Follow.  Stable.  

## 2016-08-07 NOTE — Assessment & Plan Note (Signed)
On paxil and doing well.  Follow.  

## 2016-08-07 NOTE — Assessment & Plan Note (Signed)
On crestor.   

## 2016-08-07 NOTE — Assessment & Plan Note (Signed)
Blood pressure under good control.  Continue same medication regimen.  Follow pressures.  Follow metabolic panel.   

## 2016-08-07 NOTE — Assessment & Plan Note (Signed)
No significant issues now.  Has been worked up previously.  Feels doing well.  Follow.

## 2016-08-07 NOTE — Assessment & Plan Note (Signed)
Low carb diet and exercise.  Follow met b and a1c.   

## 2016-08-07 NOTE — Assessment & Plan Note (Signed)
Previous carotid ultrasound - no significant stenosis.  On antiplatelet therapy.  Follow.

## 2016-08-07 NOTE — Assessment & Plan Note (Signed)
Ultrasound as outlined.  Has appt with endocrinology 08/16/16.

## 2016-08-07 NOTE — Assessment & Plan Note (Signed)
Continue risk factor modification 

## 2016-08-07 NOTE — Assessment & Plan Note (Signed)
On crestor.  Low cholesterol diet and exercise.  Follow lipid panel and liver function tests.   

## 2016-08-16 DIAGNOSIS — E041 Nontoxic single thyroid nodule: Secondary | ICD-10-CM | POA: Diagnosis not present

## 2016-08-26 DIAGNOSIS — E041 Nontoxic single thyroid nodule: Secondary | ICD-10-CM | POA: Diagnosis not present

## 2016-08-30 DIAGNOSIS — C73 Malignant neoplasm of thyroid gland: Secondary | ICD-10-CM | POA: Diagnosis not present

## 2016-09-04 DIAGNOSIS — C73 Malignant neoplasm of thyroid gland: Secondary | ICD-10-CM | POA: Diagnosis not present

## 2016-09-10 DIAGNOSIS — Z8041 Family history of malignant neoplasm of ovary: Secondary | ICD-10-CM | POA: Diagnosis not present

## 2016-09-10 DIAGNOSIS — Z8673 Personal history of transient ischemic attack (TIA), and cerebral infarction without residual deficits: Secondary | ICD-10-CM | POA: Diagnosis not present

## 2016-09-10 DIAGNOSIS — Z7902 Long term (current) use of antithrombotics/antiplatelets: Secondary | ICD-10-CM | POA: Diagnosis not present

## 2016-09-10 DIAGNOSIS — Z888 Allergy status to other drugs, medicaments and biological substances status: Secondary | ICD-10-CM | POA: Diagnosis not present

## 2016-09-10 DIAGNOSIS — Z7982 Long term (current) use of aspirin: Secondary | ICD-10-CM | POA: Diagnosis not present

## 2016-09-10 DIAGNOSIS — Z800 Family history of malignant neoplasm of digestive organs: Secondary | ICD-10-CM | POA: Diagnosis not present

## 2016-09-10 DIAGNOSIS — Z91013 Allergy to seafood: Secondary | ICD-10-CM | POA: Diagnosis not present

## 2016-09-10 DIAGNOSIS — C73 Malignant neoplasm of thyroid gland: Secondary | ICD-10-CM | POA: Diagnosis not present

## 2016-09-10 DIAGNOSIS — Z79899 Other long term (current) drug therapy: Secondary | ICD-10-CM | POA: Diagnosis not present

## 2016-09-10 DIAGNOSIS — Z885 Allergy status to narcotic agent status: Secondary | ICD-10-CM | POA: Diagnosis not present

## 2016-09-10 DIAGNOSIS — M1990 Unspecified osteoarthritis, unspecified site: Secondary | ICD-10-CM | POA: Diagnosis not present

## 2016-09-10 DIAGNOSIS — I10 Essential (primary) hypertension: Secondary | ICD-10-CM | POA: Diagnosis not present

## 2016-09-13 ENCOUNTER — Encounter: Payer: Self-pay | Admitting: Family Medicine

## 2016-09-13 ENCOUNTER — Ambulatory Visit (INDEPENDENT_AMBULATORY_CARE_PROVIDER_SITE_OTHER): Payer: Medicare Other | Admitting: Family Medicine

## 2016-09-13 DIAGNOSIS — I6523 Occlusion and stenosis of bilateral carotid arteries: Secondary | ICD-10-CM

## 2016-09-13 DIAGNOSIS — J069 Acute upper respiratory infection, unspecified: Secondary | ICD-10-CM | POA: Diagnosis not present

## 2016-09-13 NOTE — Patient Instructions (Signed)
It may be the case that you can still have the surgery done on Monday but it depends on how you feel in the interval.  Update the surgery team on Sunday about how you feel.  Rest and fluids in the meantime.  Avoid tylenol for now.   I'll update your other docs.  Take care.  Glad to see you.

## 2016-09-13 NOTE — Progress Notes (Signed)
Has thyroid surgery planned for Monday 09/16/16 for papillary thyroid cancer. D/w Tamara [name].   Sx started about 1-2 days ago.  She didn't feel like her baseline self.  Then had a runny nose and cough.  tmax 99.6  No vomiting.  No diarrhea.  No wheeze.  No sputum.  Some B ear pain.  Some facial pain, pressure.  Minimal ST, was scratchy but better now.    Still on baseline meds except for ASA and plavix per rec of outside MD.   Surgery is to be done at [facility].  She clearly feels better today than yesterday.    Rash is from taking tylenol.  She has h/o rash with higher dose.  D/w Tamara [name].  This is typical for patient.  She doesn't have sx from tylenol use other than rash.    Meds, vitals, and allergies reviewed.   ROS: Per HPI unless specifically indicated in ROS section   GEN: nad, alert and oriented HEENT: mucous membranes moist, tm w/o erythema, nasal exam w/o erythema, clear discharge noted,  OP with cobblestoning, sinuses not ttp  NECK: supple w/o LA but right thyroid nodule noted CV: rrr.   PULM: ctab, no inc wob EXT: no edema SKIN: blanching irregular red macular rash on the trunk

## 2016-09-15 DIAGNOSIS — J069 Acute upper respiratory infection, unspecified: Secondary | ICD-10-CM | POA: Insufficient documentation

## 2016-09-15 NOTE — Assessment & Plan Note (Addendum)
Discussed with patient. Nontoxic. She feels better today than yesterday. Okay for outpatient follow-up. Under normal circumstances we would not do anything other than routine supportive care. Discussed with patient. The issue really is her surgery on Monday. At this point, there is nothing about her history or exam that would clearly preclude her from going through with surgery. It will really depend on how she feels on Sunday night and Monday morning. I cannot protect this. Discussed with patient. She understood. She will update the surgery team Sunday night when they make the preop call. I will route this note to her PCP and the surgery team in the meantime. See after visit summary.  The rash should resolve with cessation of Tylenol use.

## 2016-09-16 DIAGNOSIS — K529 Noninfective gastroenteritis and colitis, unspecified: Secondary | ICD-10-CM | POA: Diagnosis not present

## 2016-09-16 DIAGNOSIS — Z7982 Long term (current) use of aspirin: Secondary | ICD-10-CM | POA: Diagnosis not present

## 2016-09-16 DIAGNOSIS — R011 Cardiac murmur, unspecified: Secondary | ICD-10-CM | POA: Diagnosis not present

## 2016-09-16 DIAGNOSIS — M1990 Unspecified osteoarthritis, unspecified site: Secondary | ICD-10-CM | POA: Diagnosis not present

## 2016-09-16 DIAGNOSIS — Z7902 Long term (current) use of antithrombotics/antiplatelets: Secondary | ICD-10-CM | POA: Diagnosis not present

## 2016-09-16 DIAGNOSIS — Z79899 Other long term (current) drug therapy: Secondary | ICD-10-CM | POA: Diagnosis not present

## 2016-09-16 DIAGNOSIS — C73 Malignant neoplasm of thyroid gland: Secondary | ICD-10-CM | POA: Diagnosis not present

## 2016-09-16 DIAGNOSIS — Z800 Family history of malignant neoplasm of digestive organs: Secondary | ICD-10-CM | POA: Diagnosis not present

## 2016-09-16 DIAGNOSIS — I10 Essential (primary) hypertension: Secondary | ICD-10-CM | POA: Diagnosis not present

## 2016-09-16 DIAGNOSIS — F329 Major depressive disorder, single episode, unspecified: Secondary | ICD-10-CM | POA: Diagnosis not present

## 2016-09-16 DIAGNOSIS — Z8673 Personal history of transient ischemic attack (TIA), and cerebral infarction without residual deficits: Secondary | ICD-10-CM | POA: Diagnosis not present

## 2016-09-16 DIAGNOSIS — Z8041 Family history of malignant neoplasm of ovary: Secondary | ICD-10-CM | POA: Diagnosis not present

## 2016-09-25 DIAGNOSIS — Z483 Aftercare following surgery for neoplasm: Secondary | ICD-10-CM | POA: Diagnosis not present

## 2016-09-25 DIAGNOSIS — R5383 Other fatigue: Secondary | ICD-10-CM | POA: Diagnosis not present

## 2016-09-25 DIAGNOSIS — Z72820 Sleep deprivation: Secondary | ICD-10-CM | POA: Diagnosis not present

## 2016-09-25 DIAGNOSIS — R079 Chest pain, unspecified: Secondary | ICD-10-CM | POA: Diagnosis not present

## 2016-09-25 DIAGNOSIS — F419 Anxiety disorder, unspecified: Secondary | ICD-10-CM | POA: Diagnosis not present

## 2016-09-25 DIAGNOSIS — R531 Weakness: Secondary | ICD-10-CM | POA: Diagnosis not present

## 2016-09-25 DIAGNOSIS — C73 Malignant neoplasm of thyroid gland: Secondary | ICD-10-CM | POA: Diagnosis not present

## 2016-10-04 ENCOUNTER — Ambulatory Visit (INDEPENDENT_AMBULATORY_CARE_PROVIDER_SITE_OTHER): Payer: Medicare Other | Admitting: Cardiovascular Disease

## 2016-10-04 ENCOUNTER — Encounter: Payer: Self-pay | Admitting: Cardiovascular Disease

## 2016-10-04 ENCOUNTER — Telehealth: Payer: Self-pay | Admitting: Cardiovascular Disease

## 2016-10-04 VITALS — BP 226/95 | HR 71 | Ht 64.0 in | Wt 143.8 lb

## 2016-10-04 DIAGNOSIS — R0602 Shortness of breath: Secondary | ICD-10-CM

## 2016-10-04 DIAGNOSIS — I6523 Occlusion and stenosis of bilateral carotid arteries: Secondary | ICD-10-CM | POA: Diagnosis not present

## 2016-10-04 MED ORDER — CLONIDINE HCL 0.1 MG PO TABS
0.1000 mg | ORAL_TABLET | Freq: Two times a day (BID) | ORAL | 11 refills | Status: DC
Start: 2016-10-04 — End: 2016-10-17

## 2016-10-04 NOTE — Telephone Encounter (Signed)
Returned call to daughter, ok per DPR. Patient recently had thyroidectomy 2 weeks ago. Patient has been more tired lately since the surgery. Patient went out and spent too much time outside according to daughter.  She experienced a moment of chest tightness. She took a few deep breaths and it went away. This morning she woke up with chest tightness and lightheadedness before getting out of bed.  BP was 168/70, HR 68.  Patient is not having chest pain, shortness of breath, palpitations, and dizziness at this time. Patient's 1 year f/u is due in November. Opening today with Dr Rockey Situ at 3 pm.  Daughter verbalized understanding of appointment date and time. Daughter verbalized understanding to call 911 or go to the emergency room, if she develops any new or worsening symptoms.

## 2016-10-04 NOTE — Telephone Encounter (Signed)
Pt daughter states pt BP is 168/70. States she had thyroid surgery about 2 weeks ago Also states this morning she has has chest tightness and light headedness. [please call

## 2016-10-04 NOTE — Patient Instructions (Signed)
Medication Instructions:   If blood pressure does not come down, Take an extra losartan (for pressure >170)  Start clonidine 0.1 mg twice a day Start in the morning  Take losartan 50 mg twice a day   Call with blood pressures next week  Labwork:  No new labs needed  Testing/Procedures:  No further testing at this time   Follow-Up: It was a pleasure seeing you in the office today. Please call us if you have new issues that need to be addressed before your next appt.  [phone]  Your physician wants you to follow-up in: 6 months.  You will receive a reminder letter in the mail two months in advance. If you don't receive a letter, please call our office to schedule the follow-up appointment.  If you need a refill on your cardiac medications before your next appointment, please call your pharmacy.

## 2016-10-04 NOTE — Progress Notes (Signed)
Patient in today to see Dr. Rockey Situ for follow up. Blood pressures are elevated and 0.2 mg tablet Clonidine given to patient. She swallowed with sip of water with no difficulties and continued to discuss her plan of care with physician in the room.

## 2016-10-04 NOTE — Progress Notes (Signed)
Cardiology Office Note  Date:  10/04/2016   ID:  Tamara [name], DOB [birth year], MRN [id number]  PCP:  Einar Pheasant, MD   Chief Complaint  Patient presents with  . other    Patient c/o elevated BP, dizziness, chest tightness episode x1, recent thyroidectomy. Meds reviewed verbally with patient.     HPI:  [name] is a very pleasant 83 year old woman with hyperlipidemia, hypertension,  Previous Stroke August 2014 . Mild carotid plaque bilaterally seen Depression mild to moderate diffuse aorta and coronary plaquing/atherosclerosis on CT CAD on CT scan She presents today for follow-up of her blood pressure and history of stroke  In follow-up she presents with her daughter Recent thyroidectomy several weeks ago, difficult time recovering Feels weak, chest pain/tightness in her upper mediastinal area Developed effusion post surgery, had this drained, wonders it is coming back Markedly elevated blood pressures this morning, worse through the day Reports she continues on losartan 50 mg daily Was told by a surgeon to minimize her activities, bedrest Yesterday was out in [place], did too much  Previous Lab work  HBA1C 5.9 Total chol 179,  LDL 90 On crestor 40 mg daily  EKG on today's visit shows normal sinus rhythm with rate 71 bpm, right bundle branch block  Other past medical history reviewed History of diverticulitis, went to the emergency room twice, was given Cipro and Flagyl Family reports that she had a reaction to the Flagy  CT scan  showed mild to moderate diffuse aorta and coronary plaquing/atherosclerosis.   admitted to the hospital August 25 and discharged 09/22/2012. Diagnosis of embolic infarct in the left parietal area with right hand paresthesias and expressive aphasia.  no documented atrial fibrillation though significant concern given history of fluttering and palpitations.  30 day Holter did not show any significant arrhythmia  residual deficits have  returned to normal.  On aspirin and Plavix with no further symptoms  Echocardiogram 09/22/2012 shows ejection fraction 55-60% mild mild MR and TR, normal right ventricular systolic pressures bubble study was not performed to look for PFO EKG in the hospital showed normal sinus rhythm with rate 61 beats per minute, essentially normal MRI of the brain showed abnormal signal in the left high posterior parietal region consistent with acute ischemia Carotid ultrasound showed no hemodynamic significance. There is calcified mural plaque identified on the right and left, less than 50%   PMH:   has a past medical history of Anemia; Arthritis; Chicken pox; Colitis; Degenerative joint disease; Diverticulitis; Diverticulosis; Fibrocystic breast disease; GERD (gastroesophageal reflux disease); Glaucoma; Heart murmur; Helicobacter pylori ab+; History of depression; History of skin cancer; Hypercholesterolemia; Hypertension; IBS (irritable bowel syndrome); Internal hemorrhoids; Migraines; Osteopenia; Stroke ([place]); Tubular adenoma of colon; and Vitamin D deficiency.  PSH:    Past Surgical History:  Procedure Laterality Date  . ABDOMINAL HYSTERECTOMY  1973   secondary to bleeding  . APPENDECTOMY    . BILATERAL SALPINGOOPHORECTOMY  2004   complex ovarian cyst (left) - benign  . BREAST BIOPSY Bilateral    x4  . MANDIBLE FRACTURE SURGERY    . VENTRAL HERNIA REPAIR  2005   Dr. Tamala Julian    Current Outpatient Prescriptions  Medication Sig Dispense Refill  . aspirin 81 MG tablet Take 81 mg by mouth daily.    . clopidogrel (PLAVIX) 75 MG tablet TAKE 1 TABLET EVERY DAY 90 tablet 3  . levothyroxine (SYNTHROID, LEVOTHROID) 88 MCG tablet Take 88 mcg by mouth daily.    [name] losartan (COZAAR) 50  MG tablet Take 1 tablet (50 mg total) by mouth daily. 90 tablet 1  . PARoxetine (PAXIL) 30 MG tablet Take 1 tablet (30 mg total) by mouth daily. 90 tablet 1  . rosuvastatin (CRESTOR) 40 MG tablet Take 1 tablet (40 mg total) by  mouth daily. 90 tablet 2  . temazepam (RESTORIL) 15 MG capsule Take 1 capsule (15 mg total) by mouth at bedtime as needed for sleep. 90 capsule 0   No current facility-administered medications for this visit.      Allergies:   Flagyl [metronidazole]; Other; Prednisone; Shrimp [shellfish allergy]; Tylenol [acetaminophen]; and Vicodin [hydrocodone-acetaminophen]   Social History:  The patient  reports that she has never smoked. She has never used smokeless tobacco. She reports that she does not drink alcohol or use drugs.   Family History:   family history includes Cirrhosis in her brother; Colon cancer in her sister; Esophageal cancer in her mother; Heart disease in her father; Ovarian cancer in her sister; Parkinson's disease in her brother; Stomach cancer in her mother.    Review of Systems: Review of Systems  Constitutional: Negative.        Hurts all over, joint ache  Respiratory: Negative.   Cardiovascular: Negative.   Gastrointestinal: Negative.   Musculoskeletal: Negative.   Neurological: Negative.   Psychiatric/Behavioral: Negative.   All other systems reviewed and are negative.    PHYSICAL EXAM: VS:  BP (!) 226/95 (BP Location: Right Arm, Patient Position: Sitting, Cuff Size: Normal)   Pulse 71   Ht 5\' 4"  (1.626 m)   Wt 143 lb 12 oz (65.2 kg)   LMP 01/17/1972   BMI 24.67 kg/m  , BMI Body mass index is 24.67 kg/m. GEN: Well nourished, well developed, in no acute distress  HEENT: normal  Neck: no JVD, carotid bruits, or masses Cardiac: RRR; no murmurs, rubs, or gallops,no edema  Respiratory:  clear to auscultation bilaterally, normal work of breathing GI: soft, nontender, nondistended, + BS MS: no deformity or atrophy  Skin: warm and dry, no rash Neuro:  Strength and sensation are intact Psych: euthymic mood, full affect    Recent Labs: 05/09/2016: ALT 11; BUN 16; Creatinine, Ser 0.77; Sodium 141 06/11/2016: Potassium 4.6; TSH 3.20    Lipid Panel Lab  Results  Component Value Date   CHOL 177 05/09/2016   HDL 71.80 05/09/2016   LDLCALC 92 05/09/2016   TRIG 69.0 05/09/2016      Wt Readings from Last 3 Encounters:  10/04/16 143 lb 12 oz (65.2 kg)  09/13/16 144 lb 8 oz (65.5 kg)  08/06/16 144 lb 9.6 oz (65.6 kg)       ASSESSMENT AND PLAN:  Essential hypertension Blood pressure markedly elevated on today's visit We have given her clonidine 0.2 mg from the office Prescription provided for clonidine 0.1 mg twice a day Also recommended she increase losartan up to 50 twice a day Daughter will closely monitor blood pressure this evening and through the weekend  Hypercholesterolemia Cholesterol is at goal on the current lipid regimen. No changes to the medications were made.   Cerebrovascular accident (CVA), unspecified mechanism ([place]) No recent symptoms, tolerating aspirin and Plavix   Bilateral carotid artery stenosis Smooth plaque bilaterally, less than 39%   Aortic atherosclerosis ([place]) Recommended the need to continue aggressive cholesterol management   Coronary artery disease involving native coronary artery of native heart without angina pectoris Currently with no symptoms of angina. No further workup at this time. Continue current medication regimen.  Thyroidectomy History of thyroid cancer Still recovering, details discussed with her   Total encounter time more than 45 minutes  Greater than 50% was spent in counseling and coordination of care with the patient  She will calls with blood pressure measurements in several days' time Additional medication changes may be needed next week     Signed, Esmond Plants, M.D., Ph.D. 10/04/2016  [facility] [place], [place] [phone]

## 2016-10-17 ENCOUNTER — Ambulatory Visit (INDEPENDENT_AMBULATORY_CARE_PROVIDER_SITE_OTHER): Payer: Medicare Other | Admitting: Family

## 2016-10-17 ENCOUNTER — Encounter: Payer: Self-pay | Admitting: Family

## 2016-10-17 VITALS — BP 144/80 | HR 77 | Temp 97.7°F | Ht 64.0 in | Wt 142.6 lb

## 2016-10-17 DIAGNOSIS — I6523 Occlusion and stenosis of bilateral carotid arteries: Secondary | ICD-10-CM

## 2016-10-17 DIAGNOSIS — G8929 Other chronic pain: Secondary | ICD-10-CM | POA: Diagnosis not present

## 2016-10-17 DIAGNOSIS — M25561 Pain in right knee: Secondary | ICD-10-CM | POA: Diagnosis not present

## 2016-10-17 MED ORDER — DICLOFENAC SODIUM 1 % TD GEL: 4.0000 g | Freq: Four times a day (QID) | TRANSDERMAL | 3 refills | Status: DC

## 2016-10-17 NOTE — Progress Notes (Signed)
Subjective:    Patient ID: Tamara [name], female    DOB: 1927/06/29, 83 y.o.   MRN: [id number]  CC: Tamara [name] is a 83 y.o. female who presents today for an acute visit.    HPI: CC: right knee pain worsening past 2 weeks, no improvement.   Heat/ ice and ibuprofen helped some.   [place] 5 years ago on right knee  On concrete which pain started then and thinks recently has 'over worked it.'  Over the years, has watched 'what would I do' and pain was bearable.   Also noted taking a bath this past week and put right knee down to get up, felt severe pain, no popping at that time.   No h/o osteopenia.     Xr right knee- negative for fracture. 2017   HISTORY:  Past Medical History:  Diagnosis Date  . Anemia   . Arthritis   . Chicken pox   . Colitis   . Degenerative joint disease   . Diverticulitis   . Diverticulosis   . Fibrocystic breast disease   . GERD (gastroesophageal reflux disease)    EGD - gastritis/mild duodenitis, previous positive H. pylori  . Glaucoma   . Heart murmur   . Helicobacter pylori ab+   . History of depression   . History of skin cancer    facial  . Hypercholesterolemia   . Hypertension   . IBS (irritable bowel syndrome)   . Internal hemorrhoids   . Migraines   . Osteopenia   . Stroke ([place])   . Tubular adenoma of colon   . Vitamin D deficiency    Past Surgical History:  Procedure Laterality Date  . ABDOMINAL HYSTERECTOMY  1973   secondary to bleeding  . APPENDECTOMY    . BILATERAL SALPINGOOPHORECTOMY  2004   complex ovarian cyst (left) - benign  . BREAST BIOPSY Bilateral    x4  . MANDIBLE FRACTURE SURGERY    . VENTRAL HERNIA REPAIR  2005   Dr. Tamala Julian   Family History  Problem Relation Age of Onset  . Esophageal cancer Mother   . Stomach cancer Mother   . Heart disease Father   . Cirrhosis Brother   . Parkinson's disease Brother   . Ovarian cancer Sister   . Colon cancer Sister        half-sister    Allergies: Flagyl  [metronidazole]; Other; Prednisone; Shrimp [shellfish allergy]; Tylenol [acetaminophen]; and Vicodin [hydrocodone-acetaminophen] Current Outpatient Prescriptions on File Prior to Visit  Medication Sig Dispense Refill  . aspirin 81 MG tablet Take 81 mg by mouth daily.    . clopidogrel (PLAVIX) 75 MG tablet TAKE 1 TABLET EVERY DAY 90 tablet 3  . levothyroxine (SYNTHROID, LEVOTHROID) 88 MCG tablet Take 88 mcg by mouth daily.    [name] losartan (COZAAR) 50 MG tablet Take 1 tablet (50 mg total) by mouth daily. 90 tablet 1  . PARoxetine (PAXIL) 30 MG tablet Take 1 tablet (30 mg total) by mouth daily. 90 tablet 1  . rosuvastatin (CRESTOR) 40 MG tablet Take 1 tablet (40 mg total) by mouth daily. 90 tablet 2  . temazepam (RESTORIL) 15 MG capsule Take 1 capsule (15 mg total) by mouth at bedtime as needed for sleep. 90 capsule 0   No current facility-administered medications on file prior to visit.     Social History  Substance Use Topics  . Smoking status: Never Smoker  . Smokeless tobacco: Never Used  . Alcohol use No  Review of Systems  Constitutional: Negative for chills and fever.  Respiratory: Negative for cough.   Cardiovascular: Negative for chest pain and palpitations.  Gastrointestinal: Negative for nausea and vomiting.  Musculoskeletal: Negative for joint swelling.      Objective:    BP (!) 144/80   Pulse 77   Temp 97.7 F (36.5 C) (Oral)   Ht 5\' 4"  (1.626 m)   Wt 142 lb 9.6 oz (64.7 kg)   LMP 01/17/1972   SpO2 97%   BMI 24.48 kg/m    Physical Exam  Constitutional: She appears well-developed and well-nourished.  Eyes: Conjunctivae are normal.  Cardiovascular: Normal rate, regular rhythm, normal heart sounds and normal pulses.   Pulmonary/Chest: Effort normal and breath sounds normal. She has no wheezes. She has no rhonchi. She has no rales.  Musculoskeletal:       Right knee: She exhibits normal range of motion, no swelling, no effusion and no laceration. Tenderness  found. Medial joint line tenderness noted.  Neurological: She is alert.  Skin: Skin is warm and dry.  Psychiatric: She has a normal mood and affect. Her speech is normal and behavior is normal. Thought content normal.  Vitals reviewed.      Assessment & Plan:   Problem List Items Addressed This Visit      Other   Right knee pain - Primary    Symptoms consistent with degenerative disease, and/or meniscal injury. Due to chronicity of symptoms, have referred patient for orthopedic consult. Pending MRI prior to consult. Return precautions given.       Relevant Medications   diclofenac sodium (VOLTAREN) 1 % GEL   Other Relevant Orders   Ambulatory referral to Orthopedic Surgery   MR Knee Right Wo Contrast        I have discontinued Ms. Wire's cloNIDine. I am also having her start on diclofenac sodium. Additionally, I am having her maintain her aspirin, clopidogrel, rosuvastatin, temazepam, losartan, PARoxetine, and levothyroxine.   Meds ordered this encounter  Medications  . diclofenac sodium (VOLTAREN) 1 % GEL    Sig: Apply 4 g topically 4 (four) times daily.    Dispense:  1 Tube    Refill:  3    Order Specific Question:   Supervising Provider    Answer:   Crecencio Mc [[id number]]    Return precautions given.   Risks, benefits, and alternatives of the medications and treatment plan prescribed today were discussed, and patient expressed understanding.   Education regarding symptom management and diagnosis given to patient on AVS.  Continue to follow with Einar Pheasant, MD for routine health maintenance.   Cleophus Molt and I agreed with plan.   Mable Paris, FNP

## 2016-10-17 NOTE — Patient Instructions (Signed)
Trial of voltaren gel  Suspect meniscal injury  Pending MRI and referral to Dr Marry Guan

## 2016-10-19 NOTE — Assessment & Plan Note (Signed)
Symptoms consistent with degenerative disease, and/or meniscal injury. Due to chronicity of symptoms, have referred patient for orthopedic consult. Pending MRI prior to consult. Return precautions given.

## 2016-10-21 ENCOUNTER — Other Ambulatory Visit: Payer: Self-pay | Admitting: Internal Medicine

## 2016-10-21 NOTE — Telephone Encounter (Signed)
Last OV 08/06/2016 Next OV 12/11/2016 Last refill 07/24/2016

## 2016-10-22 ENCOUNTER — Other Ambulatory Visit: Payer: Self-pay | Admitting: Internal Medicine

## 2016-10-29 ENCOUNTER — Ambulatory Visit
Admission: RE | Admit: 2016-10-29 | Discharge: 2016-10-29 | Disposition: A | Discharge status: Home / Self Care | Payer: Medicare Other | Source: Ambulatory Visit | Attending: Family | Admitting: Family

## 2016-10-29 DIAGNOSIS — M25561 Pain in right knee: Secondary | ICD-10-CM | POA: Diagnosis not present

## 2016-10-29 DIAGNOSIS — M1711 Unilateral primary osteoarthritis, right knee: Secondary | ICD-10-CM | POA: Diagnosis not present

## 2016-10-29 DIAGNOSIS — S83271A Complex tear of lateral meniscus, current injury, right knee, initial encounter: Secondary | ICD-10-CM | POA: Insufficient documentation

## 2016-10-29 DIAGNOSIS — X58XXXA Exposure to other specified factors, initial encounter: Secondary | ICD-10-CM | POA: Diagnosis not present

## 2016-10-29 DIAGNOSIS — G8929 Other chronic pain: Secondary | ICD-10-CM | POA: Insufficient documentation

## 2016-10-29 DIAGNOSIS — M179 Osteoarthritis of knee, unspecified: Secondary | ICD-10-CM | POA: Diagnosis not present

## 2016-11-06 DIAGNOSIS — C73 Malignant neoplasm of thyroid gland: Secondary | ICD-10-CM | POA: Diagnosis not present

## 2016-11-15 ENCOUNTER — Emergency Department
Admission: EM | Admit: 2016-11-15 | Discharge: 2016-11-15 | Disposition: A | Discharge status: Home / Self Care | Payer: Medicare Other | Attending: Emergency Medicine | Admitting: Emergency Medicine

## 2016-11-15 ENCOUNTER — Emergency Department: Payer: Medicare Other

## 2016-11-15 DIAGNOSIS — I2510 Atherosclerotic heart disease of native coronary artery without angina pectoris: Secondary | ICD-10-CM | POA: Insufficient documentation

## 2016-11-15 DIAGNOSIS — Z85828 Personal history of other malignant neoplasm of skin: Secondary | ICD-10-CM | POA: Insufficient documentation

## 2016-11-15 DIAGNOSIS — I10 Essential (primary) hypertension: Secondary | ICD-10-CM | POA: Insufficient documentation

## 2016-11-15 DIAGNOSIS — Z8673 Personal history of transient ischemic attack (TIA), and cerebral infarction without residual deficits: Secondary | ICD-10-CM | POA: Insufficient documentation

## 2016-11-15 DIAGNOSIS — R102 Pelvic and perineal pain: Secondary | ICD-10-CM | POA: Diagnosis present

## 2016-11-15 DIAGNOSIS — N3001 Acute cystitis with hematuria: Secondary | ICD-10-CM | POA: Insufficient documentation

## 2016-11-15 DIAGNOSIS — Z79899 Other long term (current) drug therapy: Secondary | ICD-10-CM | POA: Diagnosis not present

## 2016-11-15 DIAGNOSIS — Z7901 Long term (current) use of anticoagulants: Secondary | ICD-10-CM | POA: Insufficient documentation

## 2016-11-15 DIAGNOSIS — Z7982 Long term (current) use of aspirin: Secondary | ICD-10-CM | POA: Diagnosis not present

## 2016-11-15 DIAGNOSIS — K5730 Diverticulosis of large intestine without perforation or abscess without bleeding: Secondary | ICD-10-CM | POA: Diagnosis not present

## 2016-11-15 HISTORY — DX: Disorder of thyroid, unspecified: E07.9

## 2016-11-15 LAB — CBC WITH DIFFERENTIAL/PLATELET
Basophils Absolute: 0.1 10*3/uL (ref 0–0.1)
Basophils Relative: 1 %
EOS PCT: 1 %
Eosinophils Absolute: 0.1 10*3/uL (ref 0–0.7)
HCT: 33.4 % — ABNORMAL LOW (ref 35.0–47.0)
Hemoglobin: 11.3 g/dL — ABNORMAL LOW (ref 12.0–16.0)
LYMPHS ABS: 1.3 10*3/uL (ref 1.0–3.6)
LYMPHS PCT: 14 %
MCH: 28.6 pg (ref 26.0–34.0)
MCHC: 33.8 g/dL (ref 32.0–36.0)
MCV: 84.7 fL (ref 80.0–100.0)
MONOS PCT: 8 %
Monocytes Absolute: 0.7 10*3/uL (ref 0.2–0.9)
Neutro Abs: 6.9 10*3/uL — ABNORMAL HIGH (ref 1.4–6.5)
Neutrophils Relative %: 76 %
PLATELETS: 333 10*3/uL (ref 150–440)
RBC: 3.94 MIL/uL (ref 3.80–5.20)
RDW: 15 % — ABNORMAL HIGH (ref 11.5–14.5)
WBC: 9 10*3/uL (ref 3.6–11.0)

## 2016-11-15 LAB — URINALYSIS, COMPLETE (UACMP) WITH MICROSCOPIC
SPECIFIC GRAVITY, URINE: 1.02 (ref 1.005–1.030)
Squamous Epithelial / LPF: NONE SEEN

## 2016-11-15 LAB — COMPREHENSIVE METABOLIC PANEL
ALK PHOS: 60 U/L (ref 38–126)
ALT: 11 U/L — AB (ref 14–54)
ANION GAP: 8 (ref 5–15)
AST: 20 U/L (ref 15–41)
Albumin: 3.5 g/dL (ref 3.5–5.0)
BUN: 15 mg/dL (ref 6–20)
CALCIUM: 8.8 mg/dL — AB (ref 8.9–10.3)
CHLORIDE: 107 mmol/L (ref 101–111)
CO2: 25 mmol/L (ref 22–32)
CREATININE: 0.66 mg/dL (ref 0.44–1.00)
Glucose, Bld: 94 mg/dL (ref 65–99)
Potassium: 3.7 mmol/L (ref 3.5–5.1)
Sodium: 140 mmol/L (ref 135–145)
Total Bilirubin: 0.4 mg/dL (ref 0.3–1.2)
Total Protein: 6.5 g/dL (ref 6.5–8.1)

## 2016-11-15 LAB — LIPASE, BLOOD: Lipase: 23 U/L (ref 11–51)

## 2016-11-15 MED ORDER — ONDANSETRON 4 MG PO TBDP
ORAL_TABLET | ORAL | Status: AC
  Filled 2016-11-15: qty 1

## 2016-11-15 MED ORDER — ONDANSETRON HCL 4 MG/2ML IJ SOLN
INTRAMUSCULAR | Status: AC
  Administered 2016-11-15: 4 mg via INTRAVENOUS
  Filled 2016-11-15: qty 2

## 2016-11-15 MED ORDER — CEPHALEXIN 500 MG PO CAPS: 500.0000 mg | ORAL_CAPSULE | Freq: Two times a day (BID) | ORAL | 0 refills | Status: AC

## 2016-11-15 MED ORDER — IOPAMIDOL (ISOVUE-300) INJECTION 61%
100.0000 mL | Freq: Once | INTRAVENOUS | Status: AC | PRN
  Administered 2016-11-15: 100 mL via INTRAVENOUS

## 2016-11-15 MED ORDER — CEFTRIAXONE SODIUM IN DEXTROSE 20 MG/ML IV SOLN
1.0000 g | Freq: Once | INTRAVENOUS | Status: AC
  Administered 2016-11-15: 1 g via INTRAVENOUS
  Filled 2016-11-15: qty 50

## 2016-11-15 MED ORDER — TRAMADOL HCL 50 MG PO TABS: 50.0000 mg | ORAL_TABLET | Freq: Four times a day (QID) | ORAL | 0 refills | Status: DC | PRN

## 2016-11-15 MED ORDER — ONDANSETRON 4 MG PO TBDP
4.0000 mg | ORAL_TABLET | Freq: Once | ORAL | Status: AC
  Administered 2016-11-15: 4 mg via ORAL

## 2016-11-15 MED ORDER — MORPHINE SULFATE (PF) 2 MG/ML IV SOLN
2.0000 mg | Freq: Once | INTRAVENOUS | Status: AC
  Administered 2016-11-15: 2 mg via INTRAVENOUS

## 2016-11-15 MED ORDER — ONDANSETRON HCL 4 MG/2ML IJ SOLN
4.0000 mg | Freq: Once | INTRAMUSCULAR | Status: AC
  Administered 2016-11-15: 4 mg via INTRAVENOUS

## 2016-11-15 MED ORDER — MORPHINE SULFATE (PF) 2 MG/ML IV SOLN
INTRAVENOUS | Status: AC
  Administered 2016-11-15: 2 mg via INTRAVENOUS
  Filled 2016-11-15: qty 1

## 2016-11-15 NOTE — ED Provider Notes (Signed)
[facility] Emergency Department Provider Note    First MD Initiated Contact with Patient 11/15/16 0542     (approximate)  I have reviewed the triage vital signs and the nursing notes.   HISTORY  Chief Complaint Pelvic Pain and Vaginal Pain    HPI Tamara [name] is a 83 y.o. female with below list of chronic medical conditions presents to the emergency department with 1 day history of dysuriapelvic pressure/pain with her current pain score of 9 out of 10. patient denies any fever no nausea or vomiting. Patient denies any diarrhea or constipation.  Past Medical History:  Diagnosis Date  . Anemia   . Arthritis   . Chicken pox   . Colitis   . Degenerative joint disease   . Diverticulitis   . Diverticulosis   . Fibrocystic breast disease   . GERD (gastroesophageal reflux disease)    EGD - gastritis/mild duodenitis, previous positive H. pylori  . Glaucoma   . Heart murmur   . Helicobacter pylori ab+   . History of depression   . History of skin cancer    facial  . Hypercholesterolemia   . Hypertension   . IBS (irritable bowel syndrome)   . Internal hemorrhoids   . Migraines   . Osteopenia   . Stroke ([place])   . Thyroid disease   . Tubular adenoma of colon   . Vitamin D deficiency     Patient Active Problem List   Diagnosis Date Noted  . URI, acute 09/15/2016  . Thyroid nodule 08/07/2016  . Right arm numbness 05/31/2015  . Aortic atherosclerosis ([place]) 05/15/2015  . CAD (coronary artery disease) 05/15/2015  . Headache, migraine 05/09/2015  . History of colon polyps 05/09/2015  . Bloodgood disease 05/09/2015  . Arthritis, degenerative 05/09/2015  . Abdominal pain 04/25/2015  . Diarrhea 04/15/2015  . Right knee pain 02/13/2015  . Hyperglycemia 08/11/2014  . Murmur, heart 05/17/2014  . Hyperlipidemia 05/17/2014  . Health care maintenance 04/17/2014  . Carotid stenosis 11/15/2013  . Dysphagia 10/28/2013  . Back pain 06/06/2013  .  Internal nasal lesion 06/06/2013  . Fatigue 05/09/2013  . Fluttering heart 10/12/2012  . History of CVA (cerebrovascular accident) 09/28/2012  . Left carotid bruit 01/17/2012  . Osteopenia 01/17/2012  . GERD (gastroesophageal reflux disease) 01/17/2012  . Depression 01/17/2012  . Hypertension 12/18/2011  . Hypercholesterolemia 12/18/2011    Past Surgical History:  Procedure Laterality Date  . ABDOMINAL HYSTERECTOMY  1973   secondary to bleeding  . APPENDECTOMY    . BILATERAL SALPINGOOPHORECTOMY  2004   complex ovarian cyst (left) - benign  . BREAST BIOPSY Bilateral    x4  . MANDIBLE FRACTURE SURGERY    . VENTRAL HERNIA REPAIR  2005   Dr. Tamala Julian    Prior to Admission medications   Medication Sig Start Date End Date Taking? Authorizing Provider  aspirin 81 MG tablet Take 81 mg by mouth daily.    [provider]  clopidogrel (PLAVIX) 75 MG tablet TAKE 1 TABLET EVERY DAY 02/20/16   Minna Merritts, MD  diclofenac sodium (VOLTAREN) 1 % GEL Apply 4 g topically 4 (four) times daily. 10/17/16   Burnard Hawthorne, FNP  levothyroxine (SYNTHROID, LEVOTHROID) 88 MCG tablet Take 88 mcg by mouth daily. 09/25/16 09/25/17  [provider]  losartan (COZAAR) 50 MG tablet Take 1 tablet (50 mg total) by mouth daily. 08/06/16   Einar Pheasant, MD  PARoxetine (PAXIL) 30 MG tablet Take 1  tablet (30 mg total) by mouth daily. 08/06/16   Einar Pheasant, MD  rosuvastatin (CRESTOR) 40 MG tablet Take 1 tablet (40 mg total) by mouth daily. 05/08/16   Minna Merritts, MD  temazepam (RESTORIL) 15 MG capsule TAKE 1 CAPSULE BY MOUTH AT BEDTIME AS NEEDED FOR SLEEP 10/21/16   Einar Pheasant, MD    Allergies Flagyl [metronidazole]; Other; Prednisone; Shrimp [shellfish allergy]; Tylenol [acetaminophen]; and Vicodin [hydrocodone-acetaminophen]  Family History  Problem Relation Age of Onset  . Esophageal cancer Mother   . Stomach cancer Mother   . Heart disease Father   . Cirrhosis Brother     . Parkinson's disease Brother   . Ovarian cancer Sister   . Colon cancer Sister        half-sister    Social History Social History  Substance Use Topics  . Smoking status: Never Smoker  . Smokeless tobacco: Never Used  . Alcohol use No    Review of Systems Constitutional: No fever/chills Eyes: No visual changes. ENT: No sore throat. Cardiovascular: Denies chest pain. Respiratory: Denies shortness of breath. Gastrointestinal: No abdominal pain.  No nausea, no vomiting.  No diarrhea.  No constipation. Genitourinary: positive for pelvic pressure, dysuria Musculoskeletal: Negative for neck pain.  Negative for back pain. Integumentary: Negative for rash. Neurological: Negative for headaches, focal weakness or numbness.   ____________________________________________   PHYSICAL EXAM:  VITAL SIGNS: ED Triage Vitals  Enc Vitals Group     BP 11/15/16 0534 (!) 198/86     Pulse Rate 11/15/16 0531 90     Resp 11/15/16 0531 18     Temp 11/15/16 0531 98.1 F (36.7 C)     Temp Source 11/15/16 0531 Oral     SpO2 --      Weight 11/15/16 0531 65.3 kg (144 lb)     Height 11/15/16 0531 1.6 m (5\' 3" )     Head Circumference --      Peak Flow --      Pain Score 11/15/16 0530 10     Pain Loc --      Pain Edu? --      Excl. in [place]? --     Constitutional: Alert and oriented. Well appearing and in no acute distress. Eyes: Conjunctivae are normal.  Head: Atraumatic. Mouth/Throat: Mucous membranes are moist.  Oropharynx non-erythematous.* Neck: No stridor.   Cardiovascular: Normal rate, regular rhythm. Good peripheral circulation. Grossly normal heart sounds. Respiratory: Normal respiratory effort.  No retractions. Lungs CTAB. Gastrointestinal: suprapubic left lower quadrant left upper quadrant tenderness to palpation. No distention.  Musculoskeletal: No lower extremity tenderness nor edema. No gross deformities of extremities. Neurologic:  Normal speech and language. No gross focal  neurologic deficits are appreciated.  Skin:  Skin is warm, dry and intact. No rash noted. Psychiatric: Mood and affect are normal. Speech and behavior are normal.  ____________________________________________   LABS (all labs ordered are listed, but only abnormal results are displayed)  Labs Reviewed  URINALYSIS, COMPLETE (UACMP) WITH MICROSCOPIC - Abnormal; Notable for the following:       Result Value   APPearance TURBID (*)    Glucose, UA   (*)    Value: TEST NOT REPORTED DUE TO COLOR INTERFERENCE OF URINE PIGMENT   Hgb urine dipstick   (*)    Value: TEST NOT REPORTED DUE TO COLOR INTERFERENCE OF URINE PIGMENT   Bilirubin Urine   (*)    Value: TEST NOT REPORTED DUE TO COLOR INTERFERENCE OF URINE PIGMENT  Ketones, ur   (*)    Value: TEST NOT REPORTED DUE TO COLOR INTERFERENCE OF URINE PIGMENT   Protein, ur   (*)    Value: TEST NOT REPORTED DUE TO COLOR INTERFERENCE OF URINE PIGMENT   Nitrite   (*)    Value: TEST NOT REPORTED DUE TO COLOR INTERFERENCE OF URINE PIGMENT   Leukocytes, UA   (*)    Value: TEST NOT REPORTED DUE TO COLOR INTERFERENCE OF URINE PIGMENT   Bacteria, UA MANY (*)    All other components within normal limits  CBC WITH DIFFERENTIAL/PLATELET - Abnormal; Notable for the following:    Hemoglobin 11.3 (*)    HCT 33.4 (*)    RDW 15.0 (*)    Neutro Abs 6.9 (*)    All other components within normal limits  COMPREHENSIVE METABOLIC PANEL - Abnormal; Notable for the following:    Calcium 8.8 (*)    ALT 11 (*)    All other components within normal limits  LIPASE, BLOOD     RADIOLOGY I, [place] N Ayde Record, personally viewed and evaluated these images (plain radiographs) as part of my medical decision making, as well as reviewing the written report by the radiologist.  Ct Abdomen Pelvis W Contrast  Result Date: 11/15/2016 CLINICAL DATA:  83 y/o  F; pelvic pressure and pain. EXAM: CT ABDOMEN AND PELVIS WITH CONTRAST TECHNIQUE: Multidetector CT imaging of the  abdomen and pelvis was performed using the standard protocol following bolus administration of intravenous contrast. CONTRAST:  13mL ISOVUE-300 IOPAMIDOL (ISOVUE-300) INJECTION 61% COMPARISON:  None. FINDINGS: Lower chest: Aortic valvular calcification. Moderate coronary artery calcification. Hepatobiliary: Stable subcentimeter lucencies in the right lobe of the liver likely representing cysts. No other focal liver lesion. Normal gallbladder. No intra or extrahepatic biliary ductal dilatation. Pancreas: Unremarkable. No pancreatic ductal dilatation or surrounding inflammatory changes. Spleen: Normal in size without focal abnormality. Adrenals/Urinary Tract: Normal adrenal glands. Diffuse bladder wall mucosal thickening and urothelial enhancement of the renal collecting systems best appreciated in the right renal pelvis (series 2, image 36). No hydronephrosis. Subcentimeter lucencies in left kidney cortex are compatible with cysts. Stomach/Bowel: Stomach is within normal limits. Sigmoid diverticulosis. No evidence of bowel wall thickening, distention, or inflammatory changes. Vascular/Lymphatic: Aortic atherosclerosis. No enlarged abdominal or pelvic lymph nodes. Reproductive: Status post hysterectomy. No adnexal masses. Other: No abdominal wall hernia or abnormality. No abdominopelvic ascites. Musculoskeletal: Mild lumbar spine dextrocurvature and discogenic degenerative changes greatest at the L2-L4 levels with there is at least disc space narrowing. No acute fracture identified. IMPRESSION: 1. Diffuse urothelial and bladder mucosal enhancement likely representing infection of the renal collecting systems. No specific findings for pyelonephritis, however, not excluded with imaging. 2. Aortic valvular calcification can be associated with aortic stenosis. Moderate coronary artery and aortic calcific atherosclerosis. 3. Sigmoid diverticulosis.  No findings of acute diverticulitis. Electronically Signed   By: Kristine Garbe M.D.   On: 11/15/2016 07:00    ____________________________________________    Procedures   ____________________________________________   INITIAL IMPRESSION / ASSESSMENT AND PLAN / ED COURSE  As part of my medical decision making, I reviewed the following data within the electronic MEDICAL RECORD [id number] year old female presenting with above stated history and physical exam concerning for acute cystitis, pyelonephritis and less likely diverticulitis. CT scan of the abdomen and pelvis consistent with cystitis. Patient given IV Cefizox 1 g in the emergency department will be prescribed Keflex for home.     ____________________________________________  FINAL CLINICAL IMPRESSION(S) / ED DIAGNOSES  Final diagnoses:  Acute cystitis with hematuria     MEDICATIONS GIVEN DURING THIS VISIT:  Medications  cefTRIAXone (ROCEPHIN) 1 g in dextrose 5 % 50 mL IVPB - Premix (1 g Intravenous New Bag/Given 11/15/16 0654)  ondansetron (ZOFRAN-ODT) disintegrating tablet 4 mg (4 mg Oral Given 11/15/16 0544)  morphine 2 MG/ML injection 2 mg (2 mg Intravenous Given 11/15/16 0609)  ondansetron (ZOFRAN) injection 4 mg (4 mg Intravenous Given 11/15/16 0607)  iopamidol (ISOVUE-300) 61 % injection 100 mL (100 mLs Intravenous Contrast Given 11/15/16 0635)     NEW OUTPATIENT MEDICATIONS STARTED DURING THIS VISIT:  New Prescriptions   No medications on file    Modified Medications   No medications on file    Discontinued Medications   No medications on file     Note:  This document was prepared using Dragon voice recognition software and may include unintentional dictation errors.    Gregor Hams, MD 11/15/16 [phone]

## 2016-11-15 NOTE — ED Notes (Signed)
Pt reports being nauseated. MD made aware. Order for zofran.

## 2016-11-15 NOTE — ED Triage Notes (Signed)
Patient c/o pelvic pressure/pain, vaginal pain, oliguria beginning yesterday with increasing severity today.

## 2016-11-17 LAB — URINE CULTURE

## 2016-12-03 ENCOUNTER — Other Ambulatory Visit: Payer: Self-pay | Admitting: Cardiovascular Disease

## 2016-12-05 ENCOUNTER — Ambulatory Visit: Payer: Medicare Other | Admitting: Cardiovascular Disease

## 2016-12-09 ENCOUNTER — Other Ambulatory Visit (INDEPENDENT_AMBULATORY_CARE_PROVIDER_SITE_OTHER): Payer: Medicare Other

## 2016-12-09 DIAGNOSIS — I10 Essential (primary) hypertension: Secondary | ICD-10-CM | POA: Diagnosis not present

## 2016-12-09 DIAGNOSIS — R739 Hyperglycemia, unspecified: Secondary | ICD-10-CM | POA: Diagnosis not present

## 2016-12-09 DIAGNOSIS — E7800 Pure hypercholesterolemia, unspecified: Secondary | ICD-10-CM | POA: Diagnosis not present

## 2016-12-09 LAB — LIPID PANEL
CHOL/HDL RATIO: 3
Cholesterol: 197 mg/dL (ref 0–200)
HDL: 73.2 mg/dL (ref >39.00)
LDL Cholesterol: 108 mg/dL — ABNORMAL HIGH (ref 0–99)
NONHDL: 123.93
TRIGLYCERIDES: 78 mg/dL (ref 0.0–149.0)
VLDL: 15.6 mg/dL (ref 0.0–40.0)

## 2016-12-09 LAB — CBC WITH DIFFERENTIAL/PLATELET
BASOS PCT: 0.5 % (ref 0.0–3.0)
Basophils Absolute: 0 10*3/uL (ref 0.0–0.1)
EOS ABS: 0 10*3/uL (ref 0.0–0.7)
Eosinophils Relative: 0.9 % (ref 0.0–5.0)
HEMATOCRIT: 37.1 % (ref 36.0–46.0)
Hemoglobin: 12.2 g/dL (ref 12.0–15.0)
LYMPHS ABS: 1.3 10*3/uL (ref 0.7–4.0)
LYMPHS PCT: 23.1 % (ref 12.0–46.0)
MCHC: 32.8 g/dL (ref 30.0–36.0)
MCV: 86.9 fl (ref 78.0–100.0)
Monocytes Absolute: 0.4 10*3/uL (ref 0.1–1.0)
Monocytes Relative: 7.2 % (ref 3.0–12.0)
NEUTROS ABS: 3.9 10*3/uL (ref 1.4–7.7)
NEUTROS PCT: 68.3 % (ref 43.0–77.0)
PLATELETS: 397 10*3/uL (ref 150.0–400.0)
RBC: 4.27 Mil/uL (ref 3.87–5.11)
RDW: 14.7 % (ref 11.5–15.5)
WBC: 5.7 10*3/uL (ref 4.0–10.5)

## 2016-12-09 LAB — HEPATIC FUNCTION PANEL
ALK PHOS: 64 U/L (ref 39–117)
ALT: 9 U/L (ref 0–35)
AST: 15 U/L (ref 0–37)
Albumin: 4 g/dL (ref 3.5–5.2)
BILIRUBIN DIRECT: 0.1 mg/dL (ref 0.0–0.3)
TOTAL PROTEIN: 7 g/dL (ref 6.0–8.3)
Total Bilirubin: 0.5 mg/dL (ref 0.2–1.2)

## 2016-12-09 LAB — BASIC METABOLIC PANEL
BUN: 15 mg/dL (ref 6–23)
CHLORIDE: 103 meq/L (ref 96–112)
CO2: 27 meq/L (ref 19–32)
Calcium: 9.4 mg/dL (ref 8.4–10.5)
Creatinine, Ser: 0.73 mg/dL (ref 0.40–1.20)
GFR: 80.18 mL/min (ref >60.00)
GLUCOSE: 95 mg/dL (ref 70–99)
POTASSIUM: 4.6 meq/L (ref 3.5–5.1)
SODIUM: 139 meq/L (ref 135–145)

## 2016-12-09 LAB — TSH: TSH: 0.12 u[IU]/mL — ABNORMAL LOW (ref 0.35–4.50)

## 2016-12-09 LAB — HEMOGLOBIN A1C: Hgb A1c MFr Bld: 5.9 % (ref 4.6–6.5)

## 2016-12-10 DIAGNOSIS — M2391 Unspecified internal derangement of right knee: Secondary | ICD-10-CM | POA: Diagnosis not present

## 2016-12-10 DIAGNOSIS — G8929 Other chronic pain: Secondary | ICD-10-CM | POA: Diagnosis not present

## 2016-12-10 DIAGNOSIS — M25561 Pain in right knee: Secondary | ICD-10-CM | POA: Diagnosis not present

## 2016-12-11 ENCOUNTER — Other Ambulatory Visit: Payer: Self-pay

## 2016-12-11 ENCOUNTER — Ambulatory Visit (INDEPENDENT_AMBULATORY_CARE_PROVIDER_SITE_OTHER): Payer: Medicare Other | Admitting: Internal Medicine

## 2016-12-11 ENCOUNTER — Encounter: Payer: Self-pay | Admitting: Internal Medicine

## 2016-12-11 DIAGNOSIS — F329 Major depressive disorder, single episode, unspecified: Secondary | ICD-10-CM

## 2016-12-11 DIAGNOSIS — I2510 Atherosclerotic heart disease of native coronary artery without angina pectoris: Secondary | ICD-10-CM

## 2016-12-11 DIAGNOSIS — I700 Atherosclerosis of aorta: Secondary | ICD-10-CM | POA: Diagnosis not present

## 2016-12-11 DIAGNOSIS — I10 Essential (primary) hypertension: Secondary | ICD-10-CM | POA: Diagnosis not present

## 2016-12-11 DIAGNOSIS — Z8673 Personal history of transient ischemic attack (TIA), and cerebral infarction without residual deficits: Secondary | ICD-10-CM

## 2016-12-11 DIAGNOSIS — R739 Hyperglycemia, unspecified: Secondary | ICD-10-CM | POA: Diagnosis not present

## 2016-12-11 DIAGNOSIS — C73 Malignant neoplasm of thyroid gland: Secondary | ICD-10-CM

## 2016-12-11 DIAGNOSIS — I6523 Occlusion and stenosis of bilateral carotid arteries: Secondary | ICD-10-CM

## 2016-12-11 DIAGNOSIS — E7800 Pure hypercholesterolemia, unspecified: Secondary | ICD-10-CM | POA: Diagnosis not present

## 2016-12-11 DIAGNOSIS — F32A Depression, unspecified: Secondary | ICD-10-CM

## 2016-12-11 NOTE — Progress Notes (Signed)
Patient ID: Tamara [name], female   DOB: 18-Sep-1927, 83 y.o.   MRN: [id number]   Subjective:    Patient ID: Tamara [name], female    DOB: April 16, 1927, 83 y.o.   MRN: [id number]  HPI  Patient here for a scheduled follow up.  She was recently diagnosed with thyroid cancer.  Is s/p total thyroidectomy - papillary carcinoma - 09/16/16.  On thyroid replacement now.  Continues to f/u with Dr Gabriel Carina.   Has f/u 12/2016.  Was evaluated 11/15/16 and diagnosed with uti.  Treated with keflex.  Symptoms resolved.  Blood pressure elevated.  Instructed to increase losartan to bid.  She did this for a while, but is back to taking q day now.  States blood pressure has been doing well.  Her daughter checks.  No chest pain.  No sob.  No acid reflux.  No abdominal pain.  Bowels moving.     Past Medical History:  Diagnosis Date  . Anemia   . Arthritis   . Chicken pox   . Colitis   . Degenerative joint disease   . Diverticulitis   . Diverticulosis   . Fibrocystic breast disease   . GERD (gastroesophageal reflux disease)    EGD - gastritis/mild duodenitis, previous positive H. pylori  . Glaucoma   . Heart murmur   . Helicobacter pylori ab+   . History of depression   . History of skin cancer    facial  . Hypercholesterolemia   . Hypertension   . IBS (irritable bowel syndrome)   . Internal hemorrhoids   . Migraines   . Osteopenia   . Stroke ([place])   . Thyroid disease   . Tubular adenoma of colon   . Vitamin D deficiency    Past Surgical History:  Procedure Laterality Date  . ABDOMINAL HYSTERECTOMY  1973   secondary to bleeding  . APPENDECTOMY    . BILATERAL SALPINGOOPHORECTOMY  2004   complex ovarian cyst (left) - benign  . BREAST BIOPSY Bilateral    x4  . MANDIBLE FRACTURE SURGERY    . VENTRAL HERNIA REPAIR  2005   Dr. Tamala Julian   Family History  Problem Relation Age of Onset  . Esophageal cancer Mother   . Stomach cancer Mother   . Heart disease Father   . Cirrhosis Brother   .  Parkinson's disease Brother   . Ovarian cancer Sister   . Colon cancer Sister        half-sister   Social History   Socioeconomic History  . Marital status: Widowed    Spouse name: None  . Number of children: 2  . Years of education: None  . Highest education level: None  Social Needs  . Financial resource strain: None  . Food insecurity - worry: None  . Food insecurity - inability: None  . Transportation needs - medical: None  . Transportation needs - non-medical: None  Occupational History  . Occupation: retired  Tobacco Use  . Smoking status: Never Smoker  . Smokeless tobacco: Never Used  Substance and Sexual Activity  . Alcohol use: No    Alcohol/week: 0.0 oz  . Drug use: No  . Sexual activity: No  Other Topics Concern  . None  Social History Narrative  . None    Outpatient Encounter Medications as of 12/11/2016  Medication Sig  . aspirin 81 MG tablet Take 81 mg by mouth daily.  . clopidogrel (PLAVIX) 75 MG tablet TAKE 1 TABLET EVERY DAY  .  levothyroxine (SYNTHROID, LEVOTHROID) 88 MCG tablet Take 88 mcg by mouth daily.  [name] losartan (COZAAR) 50 MG tablet Take 1 tablet (50 mg total) daily by mouth.  [name] PARoxetine (PAXIL) 30 MG tablet Take 1 tablet (30 mg total) daily by mouth.  . rosuvastatin (CRESTOR) 40 MG tablet TAKE 1 TABLET BY MOUTH DAILY  . temazepam (RESTORIL) 15 MG capsule TAKE 1 CAPSULE BY MOUTH AT BEDTIME AS NEEDED FOR SLEEP  . [DISCONTINUED] diclofenac sodium (VOLTAREN) 1 % GEL Apply 4 g topically 4 (four) times daily.  . [DISCONTINUED] losartan (COZAAR) 50 MG tablet Take 1 tablet (50 mg total) by mouth daily.  . [DISCONTINUED] PARoxetine (PAXIL) 30 MG tablet Take 1 tablet (30 mg total) by mouth daily.  . [DISCONTINUED] temazepam (RESTORIL) 15 MG capsule TAKE 1 CAPSULE BY MOUTH AT BEDTIME AS NEEDED FOR SLEEP  . [DISCONTINUED] traMADol (ULTRAM) 50 MG tablet Take 1 tablet (50 mg total) by mouth every 6 (six) hours as needed.   No facility-administered  encounter medications on file as of 12/11/2016.     Review of Systems  Constitutional: Negative for appetite change and unexpected weight change.  HENT: Negative for congestion and sinus pressure.   Respiratory: Negative for cough, chest tightness and shortness of breath.   Cardiovascular: Negative for chest pain, palpitations and leg swelling.  Gastrointestinal: Negative for abdominal pain, diarrhea and nausea.  Genitourinary: Negative for difficulty urinating and dysuria.  Musculoskeletal: Negative for joint swelling and myalgias.  Skin: Negative for color change and rash.  Neurological: Negative for dizziness, light-headedness and headaches.  Psychiatric/Behavioral: Negative for agitation and dysphoric mood.       Objective:    Physical Exam  Constitutional: She appears well-developed and well-nourished. No distress.  HENT:  Nose: Nose normal.  Mouth/Throat: Oropharynx is clear and moist.  Neck: Neck supple. No thyromegaly present.  Cardiovascular: Normal rate and regular rhythm.  1/6 systolic murmur  Pulmonary/Chest: Breath sounds normal. No respiratory distress. She has no wheezes.  Abdominal: Soft. Bowel sounds are normal. There is no tenderness.  Musculoskeletal: She exhibits no edema or tenderness.  Lymphadenopathy:    She has no cervical adenopathy.  Skin: No rash noted. No erythema.  Psychiatric: She has a normal mood and affect. Her behavior is normal.    BP 138/60 (BP Location: Left Arm, Patient Position: Sitting, Cuff Size: Normal)   Pulse 81   Temp 98.2 F (36.8 C) (Oral)   Resp 14   Ht _0  (1.6 m)   Wt 142 lb 4.8 oz (64.5 kg)   LMP 01/17/1972   SpO2 97%   BMI 25.21 kg/m  Wt Readings from Last 3 Encounters:  12/11/16 142 lb 4.8 oz (64.5 kg)  11/15/16 144 lb (65.3 kg)  10/17/16 142 lb 9.6 oz (64.7 kg)     Lab Results  Component Value Date   WBC 5.7 12/09/2016   HGB 12.2 12/09/2016   HCT 37.1 12/09/2016   PLT 397.0 12/09/2016   GLUCOSE 95  12/09/2016   CHOL 197 12/09/2016   TRIG 78.0 12/09/2016   HDL 73.20 12/09/2016   LDLDIRECT 177.7 01/19/2013   LDLCALC 108 (H) 12/09/2016   ALT 9 12/09/2016   AST 15 12/09/2016   NA 139 12/09/2016   K 4.6 12/09/2016   CL 103 12/09/2016   CREATININE 0.73 12/09/2016   BUN 15 12/09/2016   CO2 27 12/09/2016   TSH 0.12 (L) 12/09/2016   HGBA1C 5.9 12/09/2016    Ct Abdomen Pelvis W Contrast  Result Date: 11/15/2016 CLINICAL DATA:  83 y/o  F; pelvic pressure and pain. EXAM: CT ABDOMEN AND PELVIS WITH CONTRAST TECHNIQUE: Multidetector CT imaging of the abdomen and pelvis was performed using the standard protocol following bolus administration of intravenous contrast. CONTRAST:  189m ISOVUE-300 IOPAMIDOL (ISOVUE-300) INJECTION 61% COMPARISON:  None. FINDINGS: Lower chest: Aortic valvular calcification. Moderate coronary artery calcification. Hepatobiliary: Stable subcentimeter lucencies in the right lobe of the liver likely representing cysts. No other focal liver lesion. Normal gallbladder. No intra or extrahepatic biliary ductal dilatation. Pancreas: Unremarkable. No pancreatic ductal dilatation or surrounding inflammatory changes. Spleen: Normal in size without focal abnormality. Adrenals/Urinary Tract: Normal adrenal glands. Diffuse bladder wall mucosal thickening and urothelial enhancement of the renal collecting systems best appreciated in the right renal pelvis (series 2, image 36). No hydronephrosis. Subcentimeter lucencies in left kidney cortex are compatible with cysts. Stomach/Bowel: Stomach is within normal limits. Sigmoid diverticulosis. No evidence of bowel wall thickening, distention, or inflammatory changes. Vascular/Lymphatic: Aortic atherosclerosis. No enlarged abdominal or pelvic lymph nodes. Reproductive: Status post hysterectomy. No adnexal masses. Other: No abdominal wall hernia or abnormality. No abdominopelvic ascites. Musculoskeletal: Mild lumbar spine dextrocurvature and  discogenic degenerative changes greatest at the L2-L4 levels with there is at least disc space narrowing. No acute fracture identified. IMPRESSION: 1. Diffuse urothelial and bladder mucosal enhancement likely representing infection of the renal collecting systems. No specific findings for pyelonephritis, however, not excluded with imaging. 2. Aortic valvular calcification can be associated with aortic stenosis. Moderate coronary artery and aortic calcific atherosclerosis. 3. Sigmoid diverticulosis.  No findings of acute diverticulitis. Electronically Signed   By: LKristine GarbeM.D.   On: 11/15/2016 07:00       Assessment & Plan:   Problem List Items Addressed This Visit    [place] ([place]    On crestor.        Relevant Medications   losartan (COZAAR) 50 MG tablet   CAD (coronary artery disease)    Continue risk factor modification.  Currently asymptomatic.        Relevant Medications   losartan (COZAAR) 50 MG tablet   Depression    Doing well on paxil.        Relevant Medications   PARoxetine (PAXIL) 30 MG tablet   History of CVA (cerebrovascular accident)    Stable on aspirin and plavix.  Follow.       Hypercholesterolemia    On crestor.  Low cholesterol diet and exercise.  Follow lipid panel and liver function test.s        Relevant Medications   losartan (COZAAR) 50 MG tablet   Other Relevant Orders   Lipid panel   Hepatic function panel   Hyperglycemia    Low carb diet and exercise.  Follow met b and a1c.       Relevant Orders   Hemoglobin A1c   Hypertension    Back on 571mlosartan daily.  Blood pressure as outlined.  Follow pressures.  Follow metabolic panel.        Relevant Medications   losartan (COZAAR) 50 MG tablet   Other Relevant Orders   Basic metabolic panel   [place] cancer ([place]   S/p total thyroidectomy - papillary carcinoma - 08/2016.  On thyroid replacement.  Recent tsh suppressed.  Given h/o cancer, will continue on  current dose.  She has f/u planned with endocrinology soon.  Forward labs.  Follow.            Gareth Fitzner,  MD

## 2016-12-14 ENCOUNTER — Encounter: Payer: Self-pay | Admitting: Internal Medicine

## 2016-12-14 MED ORDER — TEMAZEPAM 15 MG PO CAPS: ORAL_CAPSULE | ORAL | 0 refills | Status: DC

## 2016-12-14 MED ORDER — LOSARTAN POTASSIUM 50 MG PO TABS: 50.0000 mg | ORAL_TABLET | Freq: Every day | ORAL | 1 refills | Status: DC

## 2016-12-14 MED ORDER — PAROXETINE HCL 30 MG PO TABS: 30.0000 mg | ORAL_TABLET | Freq: Every day | ORAL | 1 refills | Status: DC

## 2016-12-14 NOTE — Assessment & Plan Note (Signed)
Low carb diet and exercise.  Follow met b and a1c.  

## 2016-12-14 NOTE — Assessment & Plan Note (Signed)
Back on 50mg  losartan daily.  Blood pressure as outlined.  Follow pressures.  Follow metabolic panel.

## 2016-12-14 NOTE — Assessment & Plan Note (Signed)
Doing well on paxil.   

## 2016-12-14 NOTE — Assessment & Plan Note (Signed)
On crestor.   

## 2016-12-14 NOTE — Assessment & Plan Note (Signed)
Stable on aspirin and plavix.  Follow.

## 2016-12-14 NOTE — Assessment & Plan Note (Signed)
Continue risk factor modification.  Currently asymptomatic.  

## 2016-12-14 NOTE — Assessment & Plan Note (Signed)
On crestor.  Low cholesterol diet and exercise.  Follow lipid panel and liver function tests.   

## 2016-12-14 NOTE — Assessment & Plan Note (Signed)
S/p total thyroidectomy - papillary carcinoma - 08/2016.  On thyroid replacement.  Recent tsh suppressed.  Given h/o cancer, will continue on current dose.  She has f/u planned with endocrinology soon.  Forward labs.  Follow.

## 2017-01-01 ENCOUNTER — Ambulatory Visit (INDEPENDENT_AMBULATORY_CARE_PROVIDER_SITE_OTHER): Payer: Medicare Other

## 2017-01-01 VITALS — BP 138/64 | HR 74 | Temp 98.3°F | Resp 14 | Ht 64.0 in | Wt 144.0 lb

## 2017-01-01 DIAGNOSIS — Z0000 Encounter for general adult medical examination without abnormal findings: Secondary | ICD-10-CM | POA: Diagnosis not present

## 2017-01-01 DIAGNOSIS — Z1239 Encounter for other screening for malignant neoplasm of breast: Secondary | ICD-10-CM

## 2017-01-01 NOTE — Patient Instructions (Addendum)
Ms. Sweigert , Thank you for taking time to come for your Medicare Wellness Visit. I appreciate your ongoing commitment to your health goals. Please review the following plan we discussed and let me know if I can assist you in the future.   Follow up with Dr. Nicki Reaper as needed.    Bring a copy of your [place] and/or Living Will to be scanned into chart.  Mammogram as directed.  Merry Christmas!!  These are the goals we discussed: Goals    . Health Maintenance     Complete Mammogram in January        This is a list of the screening recommended for you and due dates:  Health Maintenance  Topic Date Due  . Complete foot exam   01/24/1938  . Mammogram  02/24/2016  . DEXA scan (bone density measurement)  01/01/2018*  . Tetanus Vaccine  01/27/2018*  . Colon Cancer Screening  04/21/2017  . Hemoglobin A1C  06/08/2017  . Eye exam for diabetics  07/10/2017  . Flu Shot  Discontinued  . Pneumonia vaccines  Discontinued  *Topic was postponed. The date shown is not the original due date.    Mammogram A mammogram is an X-ray of the breasts that is done to check for abnormal changes. This procedure can screen for and detect any changes that may suggest breast cancer. A mammogram can also identify other changes and variations in the breast, such as:  Inflammation of the breast tissue (mastitis).  An infected area that contains a collection of pus (abscess).  A fluid-filled sac (cyst).  Fibrocystic changes. This is when breast tissue becomes denser, which can make the tissue feel rope-like or uneven under the skin.  Tumors that are not cancerous (benign).  Tell a health care provider about:  Any allergies you have.  If you have breast implants.  If you have had previous breast disease, biopsy, or surgery.  If you are breastfeeding.  Any possibility that you could be pregnant, if this applies.  If you are younger than age 39.  If you have a family history  of breast cancer. What are the risks? Generally, this is a safe procedure. However, problems may occur, including:  Exposure to radiation. Radiation levels are very low with this test.  The results being misinterpreted.  The need for further tests.  The inability of the mammogram to detect certain cancers.  What happens before the procedure?  Schedule your test about 1-2 weeks after your menstrual period. This is usually when your breasts are the least tender.  If you have had a mammogram done at a different facility in the past, get the mammogram X-rays or have them sent to your current exam facility in order to compare them.  Wash your breasts and under your arms the day of the test.  Do not wear deodorants, perfumes, lotions, or powders anywhere on your body on the day of the test.  Remove any jewelry from your neck.  Wear clothes that you can change into and out of easily. What happens during the procedure?  You will undress from the waist up and put on a gown.  You will stand in front of the X-ray machine.  Each breast will be placed between two plastic or glass plates. The plates will compress your breast for a few seconds. Try to stay as relaxed as possible during the procedure. This does not cause any harm to your breasts and any discomfort you feel  will be very brief.  X-rays will be taken from different angles of each breast. The procedure may vary among health care providers and hospitals. What happens after the procedure?  The mammogram will be examined by a specialist (radiologist).  You may need to repeat certain parts of the test, depending on the quality of the images. This is commonly done if the radiologist needs a better view of the breast tissue.  Ask when your test results will be ready. Make sure you get your test results.  You may resume your normal activities. This information is not intended to replace advice given to you by your health care  provider. Make sure you discuss any questions you have with your health care provider. Document Released: 01/12/2000 Document Revised: 06/19/2015 Document Reviewed: 03/25/2014 Elsevier Interactive Patient Education  2017 [employer].

## 2017-01-01 NOTE — Progress Notes (Signed)
Subjective:   Tamara [name] is a 83 y.o. female who presents for Medicare Annual (Subsequent) preventive examination.  Review of Systems:  No ROS.  Medicare Wellness Visit. Additional risk factors are reflected in the social history.  Cardiac Risk Factors include: advanced age (>34men, >26 women);hypertension     Objective:     Vitals: BP 138/64 (BP Location: Left Arm, Patient Position: Sitting, Cuff Size: Normal)   Pulse 74   Temp 98.3 F (36.8 C) (Oral)   Resp 14   Ht 5\' 4"  (1.626 m)   Wt 144 lb (65.3 kg)   LMP 01/17/1972   SpO2 97%   BMI 24.72 kg/m   Body mass index is 24.72 kg/m.  Advanced Directives 01/01/2017 11/15/2016 11/15/2016 01/02/2016 05/02/2015 05/02/2015  Does Patient Have a Medical Advance Directive? Yes - Yes Yes No No  Type of Paramedic of [place];Living will Out of facility DNR (pink MOST or yellow form) [place];Living will [place];Living will - -  Does patient want to make changes to medical advance directive? No - Patient declined - No - Patient declined - - -  Copy of [place] in Chart? No - copy requested - No - copy requested No - copy requested - -  Would patient like information on creating a medical advance directive? - - - - No - patient declined information -    Tobacco Social History   Tobacco Use  Smoking Status Never Smoker  Smokeless Tobacco Never Used     Counseling given: Not Answered   Clinical Intake:  Pre-visit preparation completed: Yes  Pain : 0-10 Pain Score: 2  Pain Type: Chronic pain Pain Location: Knee Pain Orientation: Right Pain Descriptors / Indicators: Constant, Pounding Pain Onset: More than a month ago     Nutritional Status: BMI 25 -29 Overweight Diabetes: No  Activities of Daily Living: Independent Ambulation: Independent Medication Administration: Independent Home Management: Independent     Do you feel unsafe  in your current relationship?: No Do you feel physically threatened by others?: No Anyone hurting you at home, work, or school?: No Unable to ask?: No Information provided on Community resources: No  How often do you need to have someone help you when you read instructions, pamphlets, or other written materials from your doctor or pharmacy?: 1 - Never  Interpreter Needed?: No     Past Medical History:  Diagnosis Date  . Anemia   . Arthritis   . Chicken pox   . Colitis   . Degenerative joint disease   . Diverticulitis   . Diverticulosis   . Fibrocystic breast disease   . GERD (gastroesophageal reflux disease)    EGD - gastritis/mild duodenitis, previous positive H. pylori  . Glaucoma   . Heart murmur   . Helicobacter pylori ab+   . History of depression   . History of skin cancer    facial  . Hypercholesterolemia   . Hypertension   . IBS (irritable bowel syndrome)   . Internal hemorrhoids   . Migraines   . Osteopenia   . Stroke ([place])   . Thyroid disease   . Tubular adenoma of colon   . Vitamin D deficiency    Past Surgical History:  Procedure Laterality Date  . ABDOMINAL HYSTERECTOMY  1973   secondary to bleeding  . APPENDECTOMY    . BILATERAL SALPINGOOPHORECTOMY  2004   complex ovarian cyst (left) - benign  . BREAST  BIOPSY Bilateral    x4  . MANDIBLE FRACTURE SURGERY    . VENTRAL HERNIA REPAIR  2005   Dr. Tamala Julian   Family History  Problem Relation Age of Onset  . Esophageal cancer Mother   . Stomach cancer Mother   . Heart disease Father   . Cirrhosis Brother   . Parkinson's disease Brother   . Ovarian cancer Sister   . Colon cancer Sister        half-sister  . Migraines Daughter   . Heart attack Son    Social History   Socioeconomic History  . Marital status: Widowed    Spouse name: None  . Number of children: 2  . Years of education: None  . Highest education level: None  Social Needs  . Financial resource strain: Not hard at all  . Food  insecurity - worry: Never true  . Food insecurity - inability: Never true  . Transportation needs - medical: No  . Transportation needs - non-medical: No  Occupational History  . Occupation: retired  Tobacco Use  . Smoking status: Never Smoker  . Smokeless tobacco: Never Used  Substance and Sexual Activity  . Alcohol use: No    Alcohol/week: 0.0 oz  . Drug use: No  . Sexual activity: No  Other Topics Concern  . None  Social History Narrative  . None    Outpatient Encounter Medications as of 01/01/2017  Medication Sig  . aspirin 81 MG tablet Take 81 mg by mouth daily.  . clopidogrel (PLAVIX) 75 MG tablet TAKE 1 TABLET EVERY DAY  . levothyroxine (SYNTHROID, LEVOTHROID) 88 MCG tablet Take 88 mcg by mouth daily.  [name] losartan (COZAAR) 50 MG tablet Take 1 tablet (50 mg total) daily by mouth.  [name] PARoxetine (PAXIL) 30 MG tablet Take 1 tablet (30 mg total) daily by mouth.  . rosuvastatin (CRESTOR) 40 MG tablet TAKE 1 TABLET BY MOUTH DAILY  . temazepam (RESTORIL) 15 MG capsule TAKE 1 CAPSULE BY MOUTH AT BEDTIME AS NEEDED FOR SLEEP   No facility-administered encounter medications on file as of 01/01/2017.     Activities of Daily Living In your present state of health, do you have any difficulty performing the following activities: 01/01/2017  Hearing? Y  Comment HOH  Vision? N  Difficulty concentrating or making decisions? N  Walking or climbing stairs? Y  Comment R knee pain  Dressing or bathing? N  Doing errands, shopping? N  Preparing Food and eating ? N  Using the Toilet? N  In the past six months, have you accidently leaked urine? N  Do you have problems with loss of bowel control? N  Managing your Medications? N  Managing your Finances? N  Housekeeping or managing your Housekeeping? N  Some recent data might be hidden   Patient Care Team: Einar Pheasant, MD as PCP - General (Internal Medicine) Minna Merritts, MD as Consulting Physician (Cardiology) Gladis Riffle, MD as Referring Physician (Surgical Oncology)    Assessment:    This is a routine wellness examination for Tamara [name]. The goal of the wellness visit is to assist the patient how to close the gaps in care and create a preventative care plan for the patient.   The roster of all physicians providing medical care to patient is listed in the Snapshot section of the chart.  Osteoporosis risk reviewed.    Safety issues reviewed; Life alert, smoke and carbon monoxide detectors in the home. No firearms in the home.  Wears seatbelts when driving or riding with others. Patient does wear sunscreen or protective clothing when in direct sunlight. No violence in the home.  Patient is alert, normal appearance, oriented to person/place/and time. Correctly identified the president of the [place], recall of 3/3 words, and performing simple calculations. Displays appropriate judgement and can read correct time from watch face.   No new identified risk were noted.  No failures at ADL's or IADL's.    BMI- discussed the importance of a healthy diet, water intake and the benefits of aerobic exercise. Educational material provided.   24 hour diet recall: Breakfast: 1 donut stick, grits  Lunch: trail mix Dinner: tomato and lettuce sandwich, boiled chicken Snack: yogurt Daily fluid intake: 2-3 cups of caffeine, 6-7 cups of water, 1  cup of cranberry juice  Dental- every 6 months.  [facility], Dr. Jacobo Forest.  Eye- Visual acuity not assessed per patient preference since they have regular follow up with the ophthalmologist.  Wears corrective lenses.  Sleep patterns- Sleeps 8 hours at night.  Wakes feeling rested.  Naps during the day.  TDAP vaccine declined.   Dexa Scan declined.  Mammogram discussed; follow as directed.  Educational material provided.  Patient Concerns: None at this time. Follow up with PCP as needed.  Exercise Activities and Dietary recommendations Current Exercise Habits:  The patient does not participate in regular exercise at present  Goals    . Health Maintenance     Complete Mammogram in January       Fall Risk Fall Risk  01/01/2017 10/17/2016 01/02/2016 12/15/2015 11/07/2015  Falls in the past year? No No Yes No No  Comment - - - - -  Number falls in past yr: - - 1 - -  Follow up - - Education provided;Falls prevention discussed - -   Depression Screen PHQ 2/9 Scores 01/01/2017 10/17/2016 08/06/2016 01/02/2016  PHQ - 2 Score 0 0 0 0  PHQ- 9 Score - - 0 -     Cognitive Function MMSE - Mini Mental State Exam 01/01/2017  Orientation to time 5  Orientation to Place 5  Registration 3  Attention/ Calculation 5  Recall 3  Language- name 2 objects 2  Language- repeat 1  Language- follow 3 step command 3  Language- read & follow direction 1  Write a sentence 1  Copy design 1  Total score 30     6CIT Screen 01/02/2016  What Year? 0 points  What month? 0 points  What time? 0 points  Count back from 20 0 points  Months in reverse 0 points  Repeat phrase 0 points  Total Score 0     There is no immunization history on file for this patient. Screening Tests Health Maintenance  Topic Date Due  . FOOT EXAM  01/24/1938  . MAMMOGRAM  02/24/2016  . DEXA SCAN  01/01/2018 (Originally 01/24/1995)  . TETANUS/TDAP  01/27/2018 (Originally 01/23/1949)  . COLONOSCOPY  04/21/2017  . HEMOGLOBIN A1C  06/08/2017  . OPHTHALMOLOGY EXAM  07/10/2017  . INFLUENZA VACCINE  Discontinued  . PNA vac Low Risk Adult  Discontinued      Plan:    End of life planning; Advance aging; Advanced directives discussed. Copy of current HCPOA/Living Will requested.    I have personally reviewed and noted the following in the patient's chart:   . Medical and social history . Use of alcohol, tobacco or illicit drugs  . Current medications and supplements . Functional ability and status .  Nutritional status . Physical activity . Advanced directives . List of other  physicians . Hospitalizations, surgeries, and ER visits in previous 12 months . Vitals . Screenings to include cognitive, depression, and falls . Referrals and appointments  In addition, I have reviewed and discussed with patient certain preventive protocols, quality metrics, and best practice recommendations. A written personalized care plan for preventive services as well as general preventive health recommendations were provided to patient.     Varney Biles, LPN  56/03/1495   Reviewed above information.  Agree with assessment and plan.  Dr Nicki Reaper

## 2017-01-14 DIAGNOSIS — E890 Postprocedural hypothyroidism: Secondary | ICD-10-CM | POA: Diagnosis not present

## 2017-01-14 DIAGNOSIS — C73 Malignant neoplasm of thyroid gland: Secondary | ICD-10-CM | POA: Diagnosis not present

## 2017-01-23 ENCOUNTER — Ambulatory Visit (INDEPENDENT_AMBULATORY_CARE_PROVIDER_SITE_OTHER): Payer: Medicare Other | Admitting: Internal Medicine

## 2017-01-23 ENCOUNTER — Encounter: Payer: Self-pay | Admitting: Internal Medicine

## 2017-01-23 VITALS — BP 158/68 | HR 68 | Temp 97.7°F | Resp 14 | Ht 64.0 in | Wt 141.8 lb

## 2017-01-23 DIAGNOSIS — R300 Dysuria: Secondary | ICD-10-CM

## 2017-01-23 DIAGNOSIS — N390 Urinary tract infection, site not specified: Secondary | ICD-10-CM

## 2017-01-23 DIAGNOSIS — I6523 Occlusion and stenosis of bilateral carotid arteries: Secondary | ICD-10-CM | POA: Diagnosis not present

## 2017-01-23 LAB — POCT URINALYSIS DIPSTICK
Bilirubin, UA: NEGATIVE
GLUCOSE UA: NEGATIVE
Nitrite, UA: POSITIVE
PH UA: 6 (ref 5.0–8.0)
Protein, UA: 30
Spec Grav, UA: 1.02 (ref 1.010–1.025)
UROBILINOGEN UA: 0.2 U/dL

## 2017-01-23 LAB — URINALYSIS, MICROSCOPIC ONLY

## 2017-01-23 MED ORDER — CIPROFLOXACIN HCL 250 MG PO TABS: 250.0000 mg | ORAL_TABLET | Freq: Two times a day (BID) | ORAL | 0 refills | Status: DC

## 2017-01-23 NOTE — Patient Instructions (Signed)
I am prescribing cipro for the UTI you appear to have

## 2017-01-23 NOTE — Progress Notes (Signed)
Subjective:  Patient ID: Tamara [name], female    DOB: 05-Jul-1927  Age: 83 y.o. MRN: [id number]  CC: The primary encounter diagnosis was Dysuria. A diagnosis of Recurrent UTI was also pertinent to this visit.  HPI Tamara [name] presents for evaluation and treatment of  suprapubic pressure and urinary hesitancy  That has been occurring and present for the  past week. She was treated for a UTI  2 months ago by ED after evaluation with abdominal CT for similar symptoms.  CT and urine culture reviewed.   S/p hysterectomy.  No lymphadenoapthy or ascites.  E coli pan sensitive treated for ten days with Keflex 500 mg bid and similar symptoms resolved before recurring several weeks later. She does not wear a pessary .  She recently underwent a total thyroidectomy for thyroid CA (August 2018) and is followed by Endocrine for thyroid supplementation.       Outpatient Medications Prior to Visit  Medication Sig Dispense Refill  . aspirin 81 MG tablet Take 81 mg by mouth daily.    . clopidogrel (PLAVIX) 75 MG tablet TAKE 1 TABLET EVERY DAY 90 tablet 3  . levothyroxine (SYNTHROID, LEVOTHROID) 88 MCG tablet Take 75 mcg by mouth daily.     [name] losartan (COZAAR) 50 MG tablet Take 1 tablet (50 mg total) daily by mouth. 90 tablet 1  . PARoxetine (PAXIL) 30 MG tablet Take 1 tablet (30 mg total) daily by mouth. 90 tablet 1  . rosuvastatin (CRESTOR) 40 MG tablet TAKE 1 TABLET BY MOUTH DAILY 90 tablet 2  . temazepam (RESTORIL) 15 MG capsule TAKE 1 CAPSULE BY MOUTH AT BEDTIME AS NEEDED FOR SLEEP 90 capsule 0   No facility-administered medications prior to visit.     Review of Systems;  Patient denies headache, fevers, malaise, unintentional weight loss, skin rash, eye pain, sinus congestion and sinus pain, sore throat, dysphagia,  hemoptysis , cough, dyspnea, wheezing, chest pain, palpitations, orthopnea, edema, abdominal pain, nausea, melena, diarrhea, constipation, flank pain, dysuria, hematuria, urinary   Frequency, nocturia, numbness, tingling, seizures,  Focal weakness, Loss of consciousness,  Tremor, insomnia, depression, anxiety, and suicidal ideation.      Objective:  BP (!) 158/68 (BP Location: Left Arm, Patient Position: Sitting, Cuff Size: Normal)   Pulse 68   Temp 97.7 F (36.5 C) (Oral)   Resp 14   Ht 5\' 4"  (1.626 m)   Wt 141 lb 12.8 oz (64.3 kg)   LMP 01/17/1972   SpO2 98%   BMI 24.34 kg/m   BP Readings from Last 3 Encounters:  01/23/17 (!) 158/68  01/01/17 138/64  12/11/16 138/60    Wt Readings from Last 3 Encounters:  01/23/17 141 lb 12.8 oz (64.3 kg)  01/01/17 144 lb (65.3 kg)  12/11/16 142 lb 4.8 oz (64.5 kg)    General appearance: alert, cooperative and appears stated age Ears: normal TM's and external ear canals both ears Throat: lips, mucosa, and tongue normal; teeth and gums normal Neck: no adenopathy, no carotid bruit, supple, symmetrical, trachea midline and thyroid not enlarged, symmetric, no tenderness/mass/nodules Back: symmetric, no curvature. ROM normal. No CVA tenderness. Lungs: clear to auscultation bilaterally Heart: regular rate and rhythm, S1, S2 normal, no murmur, click, rub or gallop Abdomen: soft, non-tender; bowel sounds normal; no masses,  no organomegaly Pulses: 2+ and symmetric Skin: Skin color, texture, turgor normal. No rashes or lesions Lymph nodes: Cervical, supraclavicular, and axillary nodes normal.  Lab Results  Component Value Date  HGBA1C 5.9 12/09/2016   HGBA1C 6.2 05/09/2016   HGBA1C 6.0 12/12/2015    Lab Results  Component Value Date   CREATININE 0.73 12/09/2016   CREATININE 0.66 11/15/2016   CREATININE 0.77 05/09/2016    Lab Results  Component Value Date   WBC 5.7 12/09/2016   HGB 12.2 12/09/2016   HCT 37.1 12/09/2016   PLT 397.0 12/09/2016   GLUCOSE 95 12/09/2016   CHOL 197 12/09/2016   TRIG 78.0 12/09/2016   HDL 73.20 12/09/2016   LDLDIRECT 177.7 01/19/2013   LDLCALC 108 (H) 12/09/2016   ALT 9  12/09/2016   AST 15 12/09/2016   NA 139 12/09/2016   K 4.6 12/09/2016   CL 103 12/09/2016   CREATININE 0.73 12/09/2016   BUN 15 12/09/2016   CO2 27 12/09/2016   TSH 0.12 (L) 12/09/2016   HGBA1C 5.9 12/09/2016    Ct Abdomen Pelvis W Contrast  Result Date: 11/15/2016 CLINICAL DATA:  83 y/o  F; pelvic pressure and pain. EXAM: CT ABDOMEN AND PELVIS WITH CONTRAST TECHNIQUE: Multidetector CT imaging of the abdomen and pelvis was performed using the standard protocol following bolus administration of intravenous contrast. CONTRAST:  181mL ISOVUE-300 IOPAMIDOL (ISOVUE-300) INJECTION 61% COMPARISON:  None. FINDINGS: Lower chest: Aortic valvular calcification. Moderate coronary artery calcification. Hepatobiliary: Stable subcentimeter lucencies in the right lobe of the liver likely representing cysts. No other focal liver lesion. Normal gallbladder. No intra or extrahepatic biliary ductal dilatation. Pancreas: Unremarkable. No pancreatic ductal dilatation or surrounding inflammatory changes. Spleen: Normal in size without focal abnormality. Adrenals/Urinary Tract: Normal adrenal glands. Diffuse bladder wall mucosal thickening and urothelial enhancement of the renal collecting systems best appreciated in the right renal pelvis (series 2, image 36). No hydronephrosis. Subcentimeter lucencies in left kidney cortex are compatible with cysts. Stomach/Bowel: Stomach is within normal limits. Sigmoid diverticulosis. No evidence of bowel wall thickening, distention, or inflammatory changes. Vascular/Lymphatic: Aortic atherosclerosis. No enlarged abdominal or pelvic lymph nodes. Reproductive: Status post hysterectomy. No adnexal masses. Other: No abdominal wall hernia or abnormality. No abdominopelvic ascites. Musculoskeletal: Mild lumbar spine dextrocurvature and discogenic degenerative changes greatest at the L2-L4 levels with there is at least disc space narrowing. No acute fracture identified. IMPRESSION: 1. Diffuse  urothelial and bladder mucosal enhancement likely representing infection of the renal collecting systems. No specific findings for pyelonephritis, however, not excluded with imaging. 2. Aortic valvular calcification can be associated with aortic stenosis. Moderate coronary artery and aortic calcific atherosclerosis. 3. Sigmoid diverticulosis.  No findings of acute diverticulitis. Electronically Signed   By: Kristine Garbe M.D.   On: 11/15/2016 07:00    Assessment & Plan:   Problem List Items Addressed This Visit    Recurrent UTI    Suggested by abnormal UA /micro and recurrent symptoms.  This is her 2nd UTI in 2 months,  First one treated with Keflex 500 mg bid for pan sensitive E Coli,  Empiric treatment started today with ciprofloxacin 250 mg bid pending repeat culture data       Other Visit Diagnoses    Dysuria    -  Primary   Relevant Orders   POCT Urinalysis Dipstick (Completed)   Urine Culture   Urine Microscopic Only (Completed)    A total of 25 minutes was spent with patient more than half of which was spent in counseling patient on the above mentioned issues , reviewing and explaining recent labs and imaging studies done, and coordination of care.  I am having Tamara [name] start  on ciprofloxacin. I am also having her maintain her aspirin, clopidogrel, levothyroxine, rosuvastatin, losartan, PARoxetine, and temazepam.  Meds ordered this encounter  Medications  . ciprofloxacin (CIPRO) 250 MG tablet    Sig: Take 1 tablet (250 mg total) by mouth 2 (two) times daily.    Dispense:  14 tablet    Refill:  0    There are no discontinued medications.  Follow-up: No Follow-up on file.   Crecencio Mc, MD

## 2017-01-25 DIAGNOSIS — N390 Urinary tract infection, site not specified: Secondary | ICD-10-CM | POA: Insufficient documentation

## 2017-01-25 NOTE — Assessment & Plan Note (Addendum)
Suggested by abnormal UA /micro and recurrent symptoms.  This is her 2nd UTI in 2 months,  First one treated with Keflex 500 mg bid for pan sensitive E Coli,  Empiric treatment started today with ciprofloxacin 250 mg bid pending repeat culture data

## 2017-01-26 LAB — URINE CULTURE
MICRO NUMBER: 81452215
SPECIMEN QUALITY:: ADEQUATE

## 2017-02-07 ENCOUNTER — Ambulatory Visit
Admission: RE | Admit: 2017-02-07 | Discharge: 2017-02-07 | Disposition: A | Discharge status: Home / Self Care | Payer: Medicare Other | Source: Ambulatory Visit | Attending: Internal Medicine | Admitting: Internal Medicine

## 2017-02-07 DIAGNOSIS — Z1231 Encounter for screening mammogram for malignant neoplasm of breast: Secondary | ICD-10-CM | POA: Insufficient documentation

## 2017-02-07 DIAGNOSIS — Z1239 Encounter for other screening for malignant neoplasm of breast: Secondary | ICD-10-CM

## 2017-02-21 DIAGNOSIS — M2391 Unspecified internal derangement of right knee: Secondary | ICD-10-CM | POA: Diagnosis not present

## 2017-03-07 ENCOUNTER — Encounter: Payer: Self-pay | Admitting: Internal Medicine

## 2017-03-12 ENCOUNTER — Other Ambulatory Visit (INDEPENDENT_AMBULATORY_CARE_PROVIDER_SITE_OTHER): Payer: Medicare Other

## 2017-03-12 DIAGNOSIS — R739 Hyperglycemia, unspecified: Secondary | ICD-10-CM

## 2017-03-12 DIAGNOSIS — E7800 Pure hypercholesterolemia, unspecified: Secondary | ICD-10-CM

## 2017-03-12 DIAGNOSIS — I10 Essential (primary) hypertension: Secondary | ICD-10-CM

## 2017-03-12 LAB — HEPATIC FUNCTION PANEL
ALT: 11 U/L (ref 0–35)
AST: 17 U/L (ref 0–37)
Albumin: 4.1 g/dL (ref 3.5–5.2)
Alkaline Phosphatase: 64 U/L (ref 39–117)
BILIRUBIN DIRECT: 0.1 mg/dL (ref 0.0–0.3)
BILIRUBIN TOTAL: 0.4 mg/dL (ref 0.2–1.2)
Total Protein: 7.1 g/dL (ref 6.0–8.3)

## 2017-03-12 LAB — BASIC METABOLIC PANEL
BUN: 23 mg/dL (ref 6–23)
CALCIUM: 9.1 mg/dL (ref 8.4–10.5)
CHLORIDE: 103 meq/L (ref 96–112)
CO2: 29 meq/L (ref 19–32)
Creatinine, Ser: 0.82 mg/dL (ref 0.40–1.20)
GFR: 70.07 mL/min (ref >60.00)
Glucose, Bld: 97 mg/dL (ref 70–99)
Potassium: 4.7 mEq/L (ref 3.5–5.1)
SODIUM: 139 meq/L (ref 135–145)

## 2017-03-12 LAB — HEMOGLOBIN A1C: Hgb A1c MFr Bld: 6.3 % (ref 4.6–6.5)

## 2017-03-12 LAB — LIPID PANEL
CHOL/HDL RATIO: 3
Cholesterol: 190 mg/dL (ref 0–200)
HDL: 68.9 mg/dL (ref >39.00)
LDL CALC: 105 mg/dL — AB (ref 0–99)
NonHDL: 121.34
TRIGLYCERIDES: 81 mg/dL (ref 0.0–149.0)
VLDL: 16.2 mg/dL (ref 0.0–40.0)

## 2017-03-14 ENCOUNTER — Ambulatory Visit (INDEPENDENT_AMBULATORY_CARE_PROVIDER_SITE_OTHER): Payer: Medicare Other

## 2017-03-14 ENCOUNTER — Encounter: Payer: Self-pay | Admitting: Internal Medicine

## 2017-03-14 ENCOUNTER — Ambulatory Visit (INDEPENDENT_AMBULATORY_CARE_PROVIDER_SITE_OTHER): Payer: Medicare Other | Admitting: Internal Medicine

## 2017-03-14 VITALS — BP 148/72 | HR 73 | Temp 98.4°F | Resp 16 | Wt 143.2 lb

## 2017-03-14 DIAGNOSIS — I6523 Occlusion and stenosis of bilateral carotid arteries: Secondary | ICD-10-CM

## 2017-03-14 DIAGNOSIS — R0602 Shortness of breath: Secondary | ICD-10-CM | POA: Diagnosis not present

## 2017-03-14 DIAGNOSIS — I10 Essential (primary) hypertension: Secondary | ICD-10-CM

## 2017-03-14 DIAGNOSIS — Z8673 Personal history of transient ischemic attack (TIA), and cerebral infarction without residual deficits: Secondary | ICD-10-CM

## 2017-03-14 DIAGNOSIS — I700 Atherosclerosis of aorta: Secondary | ICD-10-CM | POA: Diagnosis not present

## 2017-03-14 DIAGNOSIS — I2510 Atherosclerotic heart disease of native coronary artery without angina pectoris: Secondary | ICD-10-CM | POA: Diagnosis not present

## 2017-03-14 DIAGNOSIS — F329 Major depressive disorder, single episode, unspecified: Secondary | ICD-10-CM

## 2017-03-14 DIAGNOSIS — C73 Malignant neoplasm of thyroid gland: Secondary | ICD-10-CM | POA: Diagnosis not present

## 2017-03-14 DIAGNOSIS — E7800 Pure hypercholesterolemia, unspecified: Secondary | ICD-10-CM

## 2017-03-14 DIAGNOSIS — R739 Hyperglycemia, unspecified: Secondary | ICD-10-CM | POA: Diagnosis not present

## 2017-03-14 DIAGNOSIS — F32A Depression, unspecified: Secondary | ICD-10-CM

## 2017-03-14 NOTE — Progress Notes (Signed)
Patient ID: Tamara [name], female   DOB: [birth year], 84 y.o.   MRN: [id number]   Subjective:    Patient ID: Tamara [name], female    DOB: [birth year], 84 y.o.   MRN: [id number]  HPI  Patient here for a scheduled follow up.  She is accompanied by her daughter.  History obtained from both of them.  She reports noticing some increased sob with exertion.  Some increased fatigue.  No chest pain.  No acid reflux.  No abdominal pain.  Bowels moving.  Has been having persistent right knee pain.  Did not recommend surgery.  Affects her when she goes up and down stairs.  Does limit her activity at times.     Past Medical History:  Diagnosis Date  . Anemia   . Arthritis   . Chicken pox   . Colitis   . Degenerative joint disease   . Diverticulitis   . Diverticulosis   . Fibrocystic breast disease   . GERD (gastroesophageal reflux disease)    EGD - gastritis/mild duodenitis, previous positive H. pylori  . Glaucoma   . Heart murmur   . Helicobacter pylori ab+   . History of depression   . History of skin cancer    facial  . Hypercholesterolemia   . Hypertension   . IBS (irritable bowel syndrome)   . Internal hemorrhoids   . Migraines   . Osteopenia   . Stroke ([place])   . Thyroid disease   . Tubular adenoma of colon   . Vitamin D deficiency    Past Surgical History:  Procedure Laterality Date  . ABDOMINAL HYSTERECTOMY  1973   secondary to bleeding  . APPENDECTOMY    . BILATERAL SALPINGOOPHORECTOMY  2004   complex ovarian cyst (left) - benign  . BREAST BIOPSY Bilateral    x4  . MANDIBLE FRACTURE SURGERY    . VENTRAL HERNIA REPAIR  2005   Dr. Tamala Julian   Family History  Problem Relation Age of Onset  . Esophageal cancer Mother   . Stomach cancer Mother   . Heart disease Father   . Cirrhosis Brother   . Parkinson's disease Brother   . Ovarian cancer Sister   . Colon cancer Sister        half-sister  . Migraines Daughter   . Heart attack Son    Social History    Socioeconomic History  . Marital status: Widowed    Spouse name: None  . Number of children: 2  . Years of education: None  . Highest education level: None  Social Needs  . Financial resource strain: Not hard at all  . Food insecurity - worry: Never true  . Food insecurity - inability: Never true  . Transportation needs - medical: No  . Transportation needs - non-medical: No  Occupational History  . Occupation: retired  Tobacco Use  . Smoking status: Never Smoker  . Smokeless tobacco: Never Used  Substance and Sexual Activity  . Alcohol use: No    Alcohol/week: 0.0 oz  . Drug use: No  . Sexual activity: No  Other Topics Concern  . None  Social History Narrative  . None    Outpatient Encounter Medications as of 03/14/2017  Medication Sig  . aspirin 81 MG tablet Take 81 mg by mouth daily.  . clopidogrel (PLAVIX) 75 MG tablet TAKE 1 TABLET EVERY DAY  . levothyroxine (SYNTHROID, LEVOTHROID) 88 MCG tablet Take 75 mcg by mouth daily.   [name] losartan (COZAAR) 50  MG tablet Take 1 tablet (50 mg total) daily by mouth.  [name] PARoxetine (PAXIL) 30 MG tablet Take 1 tablet (30 mg total) daily by mouth.  . rosuvastatin (CRESTOR) 40 MG tablet TAKE 1 TABLET BY MOUTH DAILY  . temazepam (RESTORIL) 15 MG capsule TAKE 1 CAPSULE BY MOUTH AT BEDTIME AS NEEDED FOR SLEEP  . ciprofloxacin (CIPRO) 250 MG tablet Take 1 tablet (250 mg total) by mouth 2 (two) times daily. (Patient not taking: Reported on 03/14/2017)   No facility-administered encounter medications on file as of 03/14/2017.     Review of Systems  Constitutional: Negative for appetite change and unexpected weight change.  HENT: Negative for congestion and sinus pressure.   Respiratory: Positive for shortness of breath. Negative for cough and chest tightness.   Cardiovascular: Negative for chest pain, palpitations and leg swelling.  Gastrointestinal: Negative for abdominal pain, diarrhea, nausea and vomiting.  Genitourinary: Negative for  difficulty urinating and dysuria.  Musculoskeletal: Negative for myalgias.       Knee pain as outlined.    Skin: Negative for color change and rash.  Neurological: Negative for dizziness, light-headedness and headaches.  Psychiatric/Behavioral: Negative for agitation and dysphoric mood.       Objective:    Physical Exam  Constitutional: She appears well-developed and well-nourished. No distress.  HENT:  Nose: Nose normal.  Mouth/Throat: Oropharynx is clear and moist.  Neck: Neck supple. No thyromegaly present.  Cardiovascular: Normal rate and regular rhythm.  Pulmonary/Chest: Breath sounds normal. No respiratory distress. She has no wheezes.  Abdominal: Soft. Bowel sounds are normal. There is no tenderness.  Musculoskeletal: She exhibits no edema or tenderness.  Lymphadenopathy:    She has no cervical adenopathy.  Skin: No rash noted. No erythema.  Psychiatric: She has a normal mood and affect. Her behavior is normal.    BP (!) 148/72 (BP Location: Left Arm, Patient Position: Sitting, Cuff Size: Normal)   Pulse 73   Temp 98.4 F (36.9 C) (Oral)   Resp 16   Wt 143 lb 3.2 oz (65 kg)   LMP 01/17/1972   SpO2 98%   BMI 24.58 kg/m  Wt Readings from Last 3 Encounters:  03/14/17 143 lb 3.2 oz (65 kg)  01/23/17 141 lb 12.8 oz (64.3 kg)  01/01/17 144 lb (65.3 kg)     Lab Results  Component Value Date   WBC 5.7 12/09/2016   HGB 12.2 12/09/2016   HCT 37.1 12/09/2016   PLT 397.0 12/09/2016   GLUCOSE 97 03/12/2017   CHOL 190 03/12/2017   TRIG 81.0 03/12/2017   HDL 68.90 03/12/2017   LDLDIRECT 177.7 01/19/2013   LDLCALC 105 (H) 03/12/2017   ALT 11 03/12/2017   AST 17 03/12/2017   NA 139 03/12/2017   K 4.7 03/12/2017   CL 103 03/12/2017   CREATININE 0.82 03/12/2017   BUN 23 03/12/2017   CO2 29 03/12/2017   TSH 0.12 (L) 12/09/2016   HGBA1C 6.3 03/12/2017    Mm Screening Breast Tomo Bilateral  Result Date: 02/07/2017 CLINICAL DATA:  Screening. EXAM: 2D DIGITAL  SCREENING BILATERAL MAMMOGRAM WITH 3D TOMO WITH CAD COMPARISON:  Previous exam(s). ACR Breast Density Category b: There are scattered areas of fibroglandular density. FINDINGS: There are no findings suspicious for malignancy. Images were processed with CAD. IMPRESSION: No mammographic evidence of malignancy. A result letter of this screening mammogram will be mailed directly to the patient. RECOMMENDATION: Screening mammogram in one year. (Code:SM-B-01Y) BI-RADS CATEGORY  1: Negative. Electronically Signed  By: Nolon Nations M.D.   On: 02/07/2017 14:11       Assessment & Plan:   Problem List Items Addressed This Visit    Aortic atherosclerosis ([place])    On crestor.       CAD (coronary artery disease)    Has known CAD.  Has noticed some increased fatigue and some sob with exertion.  EKG - SR with RBBB. No acute ischemic changes.  Given history and change in symptoms, get her back in with cardiology for evaluation and question of need for further w/up.        Relevant Orders   Ambulatory referral to Cardiology   Carotid stenosis    No significant stenosis.  On antiplatelet count therapy.        Depression    Doing well on paxil.  Follow.        History of CVA (cerebrovascular accident)    Stable on aspirin and plavix.  Follow.       Hypercholesterolemia    On crestor.  Low cholesterol diet and exercise.  Follow lipid panel and liver function tests.        Relevant Orders   Hepatic function panel   Lipid panel   Hyperglycemia    Low carb diet and exercise.  Follow met b and a1c.        Relevant Orders   Hemoglobin A1c   Hypertension    Blood pressure under reasonable control.  Follow pressures.  Same medication regimen.  Follow metabolic panel.        Relevant Orders   Basic metabolic panel   CBC with Differential/Platelet   Thyroid cancer ([place])    S/p total thyroidectomy - papillary carcinoma - 08/2016.  On thyroid replacement.  Has f/u planned next week.  Is  scheduled to have tsh checked.  Keep f/u with endocrinology.         Other Visit Diagnoses    SOB (shortness of breath)    -  Primary   Relevant Orders   EKG 12-Lead (Completed)   DG Chest 2 View (Completed)   Ambulatory referral to Cardiology       Einar Pheasant, MD

## 2017-03-16 ENCOUNTER — Encounter: Payer: Self-pay | Admitting: Internal Medicine

## 2017-03-16 NOTE — Assessment & Plan Note (Signed)
On crestor.  Low cholesterol diet and exercise.  Follow lipid panel and liver function tests.   

## 2017-03-16 NOTE — Assessment & Plan Note (Signed)
Stable on aspirin and plavix.  Follow.

## 2017-03-16 NOTE — Assessment & Plan Note (Signed)
Doing well on paxil.  Follow.

## 2017-03-16 NOTE — Assessment & Plan Note (Signed)
Has known CAD.  Has noticed some increased fatigue and some sob with exertion.  EKG - SR with RBBB. No acute ischemic changes.  Given history and change in symptoms, get her back in with cardiology for evaluation and question of need for further w/up.

## 2017-03-16 NOTE — Assessment & Plan Note (Signed)
S/p total thyroidectomy - papillary carcinoma - 08/2016.  On thyroid replacement.  Has f/u planned next week.  Is scheduled to have tsh checked.  Keep f/u with endocrinology.

## 2017-03-16 NOTE — Assessment & Plan Note (Signed)
On crestor.   

## 2017-03-16 NOTE — Assessment & Plan Note (Signed)
Blood pressure under reasonable control.  Follow pressures.  Same medication regimen.  Follow metabolic panel.

## 2017-03-16 NOTE — Assessment & Plan Note (Signed)
No significant stenosis.  On antiplatelet count therapy.

## 2017-03-16 NOTE — Assessment & Plan Note (Signed)
Low carb diet and exercise.  Follow met b and a1c.   

## 2017-03-19 DIAGNOSIS — C73 Malignant neoplasm of thyroid gland: Secondary | ICD-10-CM | POA: Diagnosis not present

## 2017-03-19 DIAGNOSIS — E890 Postprocedural hypothyroidism: Secondary | ICD-10-CM | POA: Diagnosis not present

## 2017-03-26 DIAGNOSIS — E890 Postprocedural hypothyroidism: Secondary | ICD-10-CM | POA: Diagnosis not present

## 2017-03-26 DIAGNOSIS — C73 Malignant neoplasm of thyroid gland: Secondary | ICD-10-CM | POA: Diagnosis not present

## 2017-03-31 NOTE — Progress Notes (Signed)
Cardiology Office Note  Date:  04/02/2017   ID:  Tamara [name], DOB [birth year], MRN [id number]  PCP:  Einar Pheasant, MD   Chief Complaint  Patient presents with  . OTHER    CAD and SOB. Meds reviewed verbally with pt.    HPI:  Tamara [name] is a very pleasant 84 year old woman with Hyperlipidemia,  hypertension,   Previous Stroke August 2014  Mild carotid plaque bilaterally Depression mild to moderate diffuse aorta and coronary plaquing/atherosclerosis on CT CAD on CT scan She presents today for follow-up of her blood pressure and history of stroke  In follow-up she presents with her daughter Recovered from thyroidectomy She is concerned about shortness of breath after climbing stairs, has to catch her breath No regular exercise program He is to go to the gym but now Limited by chronic right knee pain Seen by orthopedics, medical management recommended first  she has declined surgery for her knee  We reviewed her recent lab work TSH 0.79 now 0.157, Normal range .45 to 5  Previous he was difficult time recovering after thyroidectomy Developed effusion post surgery, had this drained  Total chol 179,  LDL 90 On crestor 40 mg daily  EKG personally reviewed by myself on todays visit Shows Normal sinus rhythm with rate 67 bpm right bundle branch block  Other past medical history reviewed History of diverticulitis, went to the emergency room twice, was given Cipro and Flagyl Family reports that she had a reaction to the Flagy  CT scan  showed mild to moderate diffuse aorta and coronary plaquing/atherosclerosis.   admitted to the hospital August 25 and discharged 09/22/2012. Diagnosis of embolic infarct in the left parietal area with right hand paresthesias and expressive aphasia.  no documented atrial fibrillation though significant concern given history of fluttering and palpitations.  30 day Holter did not show any significant arrhythmia  residual deficits have  returned to normal.  On aspirin and Plavix with no further symptoms  Echocardiogram 09/22/2012 shows ejection fraction 55-60% mild mild MR and TR, normal right ventricular systolic pressures bubble study was not performed to look for PFO EKG in the hospital showed normal sinus rhythm with rate 61 beats per minute, essentially normal MRI of the brain showed abnormal signal in the left high posterior parietal region consistent with acute ischemia Carotid ultrasound showed no hemodynamic significance. There is calcified mural plaque identified on the right and left, less than 50%   PMH:   has a past medical history of Anemia, Arthritis, Chicken pox, Colitis, Degenerative joint disease, Diverticulitis, Diverticulosis, Fibrocystic breast disease, GERD (gastroesophageal reflux disease), Glaucoma, Heart murmur, Helicobacter pylori ab+, History of depression, History of skin cancer, Hypercholesterolemia, Hypertension, IBS (irritable bowel syndrome), Internal hemorrhoids, Migraines, Osteopenia, Stroke ([place]), Thyroid disease, Tubular adenoma of colon, and Vitamin D deficiency.  PSH:    Past Surgical History:  Procedure Laterality Date  . ABDOMINAL HYSTERECTOMY  1973   secondary to bleeding  . APPENDECTOMY    . BILATERAL SALPINGOOPHORECTOMY  2004   complex ovarian cyst (left) - benign  . BREAST BIOPSY Bilateral    x4  . MANDIBLE FRACTURE SURGERY    . VENTRAL HERNIA REPAIR  2005   Dr. Tamala Julian    Current Outpatient Medications  Medication Sig Dispense Refill  . aspirin 81 MG tablet Take 81 mg by mouth daily.    . clopidogrel (PLAVIX) 75 MG tablet TAKE 1 TABLET EVERY DAY 90 tablet 3  . levothyroxine (SYNTHROID, LEVOTHROID) 88 MCG tablet Take 75  mcg by mouth daily.     [name] losartan (COZAAR) 50 MG tablet Take 1 tablet (50 mg total) daily by mouth. 90 tablet 1  . PARoxetine (PAXIL) 30 MG tablet Take 1 tablet (30 mg total) daily by mouth. 90 tablet 1  . rosuvastatin (CRESTOR) 40 MG tablet TAKE 1 TABLET  BY MOUTH DAILY 90 tablet 2  . temazepam (RESTORIL) 15 MG capsule TAKE 1 CAPSULE BY MOUTH AT BEDTIME AS NEEDED FOR SLEEP 90 capsule 0   No current facility-administered medications for this visit.      Allergies:   Flagyl [metronidazole]; Other; Prednisone; Shrimp [shellfish allergy]; Tylenol [acetaminophen]; and Vicodin [hydrocodone-acetaminophen]   Social History:  The patient  reports that  has never smoked. she has never used smokeless tobacco. She reports that she does not drink alcohol or use drugs.   Family History:   family history includes Cirrhosis in her brother; Colon cancer in her sister; Esophageal cancer in her mother; Heart attack in her son; Heart disease in her father; Migraines in her daughter; Ovarian cancer in her sister; Parkinson's disease in her brother; Stomach cancer in her mother.    Review of Systems: Review of Systems  Constitutional: Negative.        Hurts all over, joint ache  Respiratory: Negative.   Cardiovascular: Negative.   Gastrointestinal: Negative.   Musculoskeletal: Positive for joint pain.  Neurological: Negative.   Psychiatric/Behavioral: Negative.   All other systems reviewed and are negative.    PHYSICAL EXAM: VS:  BP 140/72 (BP Location: Left Arm, Patient Position: Sitting, Cuff Size: Normal)   Pulse 67   Ht 5\' 4"  (1.626 m)   Wt 140 lb 8 oz (63.7 kg)   LMP 01/17/1972   BMI 24.12 kg/m  , BMI Body mass index is 24.12 kg/m. Constitutional:  oriented to person, place, and time. No distress.  HENT:  Head: Normocephalic and atraumatic.  Eyes:  no discharge. No scleral icterus.  Neck: Normal range of motion. Neck supple. No JVD present.  Cardiovascular: Normal rate, regular rhythm, normal heart sounds and intact distal pulses. Exam reveals no gallop and no friction rub. No edema No murmur heard. Pulmonary/Chest: Effort normal and breath sounds normal. No stridor. No respiratory distress.  no wheezes.  no rales.  no tenderness.   Abdominal: Soft.  no distension.  no tenderness.  Musculoskeletal: Normal range of motion.  no  tenderness or deformity.  Neurological:  normal muscle tone. Coordination normal. No atrophy Skin: Skin is warm and dry. No rash noted. not diaphoretic.  Psychiatric:  normal mood and affect. behavior is normal. Thought content normal.    Recent Labs: 12/09/2016: Hemoglobin 12.2; Platelets 397.0; TSH 0.12 03/12/2017: ALT 11; BUN 23; Creatinine, Ser 0.82; Potassium 4.7; Sodium 139    Lipid Panel Lab Results  Component Value Date   CHOL 190 03/12/2017   HDL 68.90 03/12/2017   LDLCALC 105 (H) 03/12/2017   TRIG 81.0 03/12/2017      Wt Readings from Last 3 Encounters:  04/02/17 140 lb 8 oz (63.7 kg)  03/14/17 143 lb 3.2 oz (65 kg)  01/23/17 141 lb 12.8 oz (64.3 kg)       ASSESSMENT AND PLAN:  Essential hypertension Blood pressure is well controlled on today's visit. No changes made to the medications. Stable  Hypercholesterolemia Cholesterol is at goal on the current lipid regimen. No changes to the medications were made. Ideally LDL should be less than 70    Cerebrovascular accident (CVA), unspecified mechanism (  [place]) tolerating aspirin and Plavix  Seen on previous scan 2014   Bilateral carotid artery stenosis Smooth plaque bilaterally, less than 39%   continue aggressive lipid management  Aortic atherosclerosis ([place]) Recommended the need to continue aggressive cholesterol management  no need for routine surveillance  Coronary artery disease involving native coronary artery of native heart without angina pectoris Currently with no symptoms of angina. No further workup at this time. Continue current medication regimen.Stable,   shortness of breath likely from deconditioning Recommended regular walking program Has been secondary secondary to chronic knee pain on the right  Thyroidectomy History of thyroid cancer Still recovering, details discussed with her   Total  encounter time more than 25 minutes  Greater than 50% was spent in counseling and coordination of care with the patient      Signed, Esmond Plants, M.D., Ph.D. 04/02/2017  [place], [place]

## 2017-04-02 ENCOUNTER — Encounter: Payer: Self-pay | Admitting: Cardiovascular Disease

## 2017-04-02 ENCOUNTER — Ambulatory Visit (INDEPENDENT_AMBULATORY_CARE_PROVIDER_SITE_OTHER): Payer: Medicare Other | Admitting: Cardiovascular Disease

## 2017-04-02 VITALS — BP 140/72 | HR 67 | Ht 64.0 in | Wt 140.5 lb

## 2017-04-02 DIAGNOSIS — I700 Atherosclerosis of aorta: Secondary | ICD-10-CM | POA: Diagnosis not present

## 2017-04-02 DIAGNOSIS — I639 Cerebral infarction, unspecified: Secondary | ICD-10-CM | POA: Diagnosis not present

## 2017-04-02 DIAGNOSIS — I6523 Occlusion and stenosis of bilateral carotid arteries: Secondary | ICD-10-CM

## 2017-04-02 DIAGNOSIS — E7800 Pure hypercholesterolemia, unspecified: Secondary | ICD-10-CM | POA: Diagnosis not present

## 2017-04-02 DIAGNOSIS — I2510 Atherosclerotic heart disease of native coronary artery without angina pectoris: Secondary | ICD-10-CM | POA: Diagnosis not present

## 2017-04-02 DIAGNOSIS — R0602 Shortness of breath: Secondary | ICD-10-CM | POA: Diagnosis not present

## 2017-04-02 DIAGNOSIS — I10 Essential (primary) hypertension: Secondary | ICD-10-CM | POA: Diagnosis not present

## 2017-04-02 NOTE — Patient Instructions (Signed)

## 2017-04-16 ENCOUNTER — Other Ambulatory Visit: Payer: Self-pay | Admitting: Internal Medicine

## 2017-04-18 NOTE — Telephone Encounter (Signed)
rx ok'd for temazepam #90 with no refills.

## 2017-04-18 NOTE — Telephone Encounter (Signed)
Last OV 04/02/2017 Next OV 06/24/17 Last refill 12/14/16

## 2017-05-02 DIAGNOSIS — R1312 Dysphagia, oropharyngeal phase: Secondary | ICD-10-CM | POA: Diagnosis not present

## 2017-05-02 DIAGNOSIS — Z85850 Personal history of malignant neoplasm of thyroid: Secondary | ICD-10-CM | POA: Diagnosis not present

## 2017-05-02 DIAGNOSIS — E890 Postprocedural hypothyroidism: Secondary | ICD-10-CM | POA: Diagnosis not present

## 2017-05-20 DIAGNOSIS — Z85850 Personal history of malignant neoplasm of thyroid: Secondary | ICD-10-CM | POA: Diagnosis not present

## 2017-05-20 DIAGNOSIS — R1312 Dysphagia, oropharyngeal phase: Secondary | ICD-10-CM | POA: Diagnosis not present

## 2017-05-24 IMAGING — CT CT ABD-PELV W/ CM
1 of 3 series · 14 of 32 positions shown, 19 images · IV contrast (iopamidol)
Comparison: 03/30/2008

CLINICAL DATA: Abdominal pain, bloating, and diarrhea for 1 month.
Intermittent nausea. 5 pound weight loss during past month. Previous
appendectomy.

EXAM:
CT ABDOMEN AND PELVIS WITH CONTRAST
TECHNIQUE: Multidetector CT imaging of the abdomen and pelvis was performed
using the standard protocol following bolus administration of
intravenous contrast.
CONTRAST:  100mL G1439F-AYY IOPAMIDOL (G1439F-AYY) INJECTION 61%

[Series 2: axial st · axial · 0.70mm/px · z∈[-925,-500]mm · 14 of 97 slices shown, 19 images]
[im 6/97  soft-tissue]
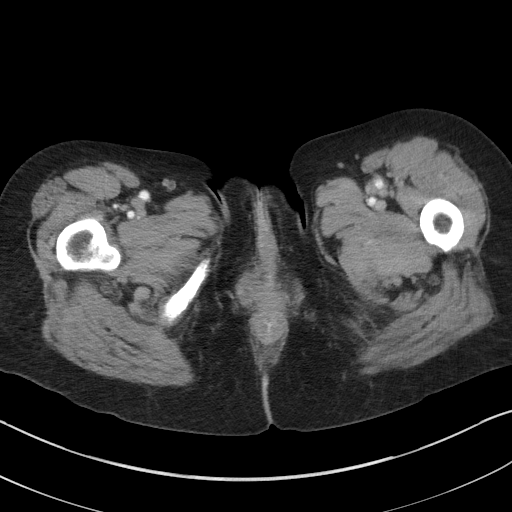
[im 6/97  bone]
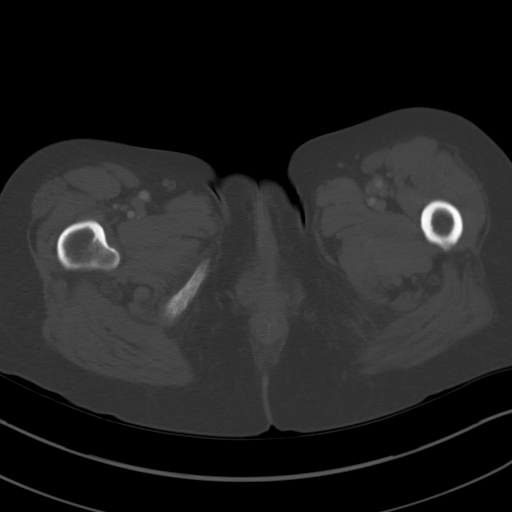
[im 16/97  soft-tissue]
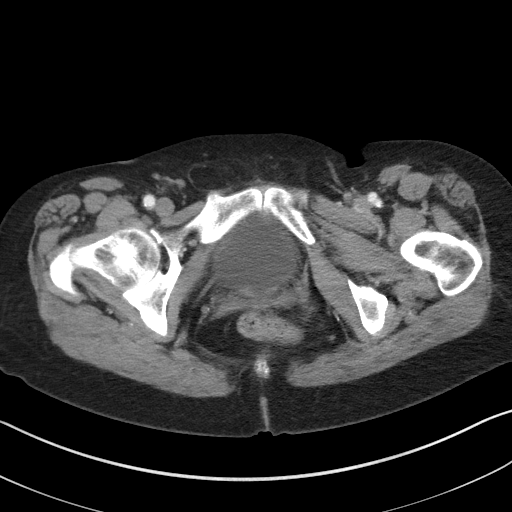
[im 21/97  soft-tissue]
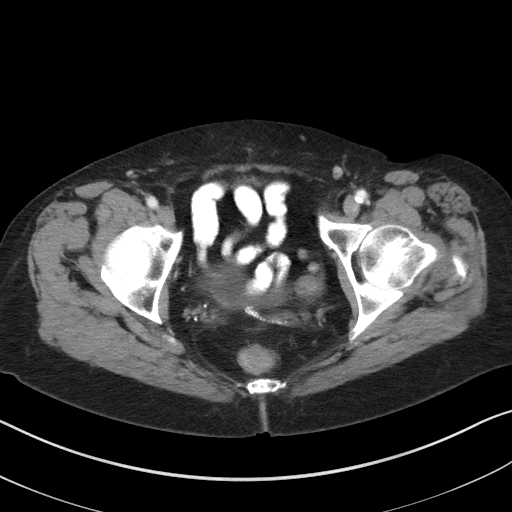
[im 26/97  soft-tissue]
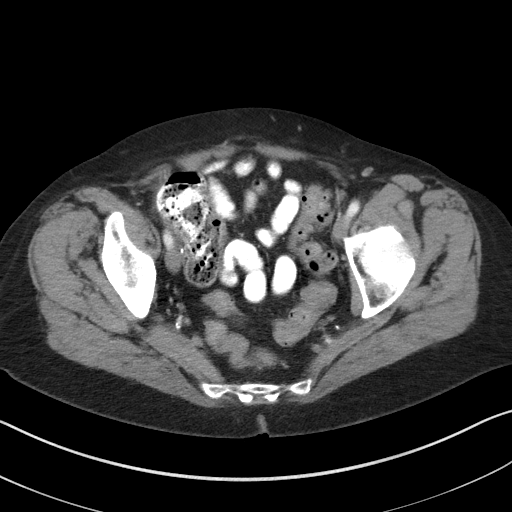
[im 36/97  soft-tissue]
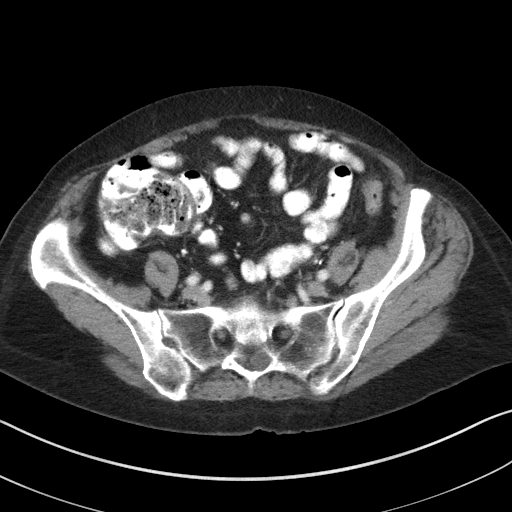
[im 41/97  soft-tissue]
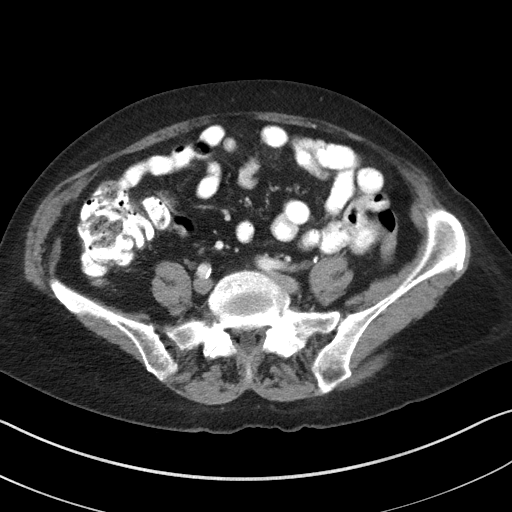
[im 51/97  soft-tissue]
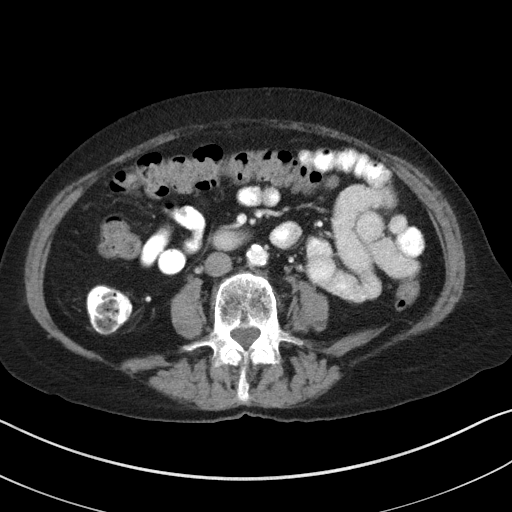
[im 56/97  soft-tissue]
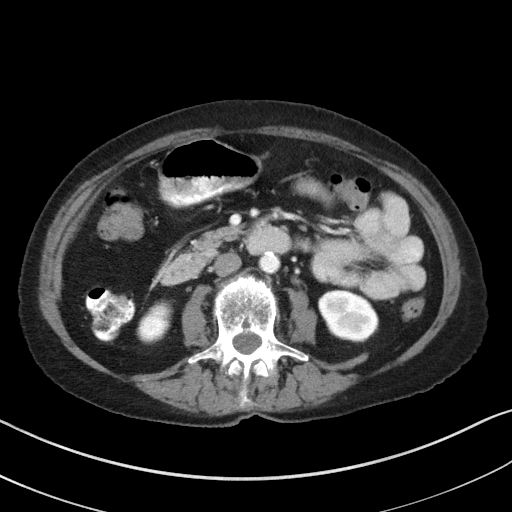
[im 61/97  soft-tissue]
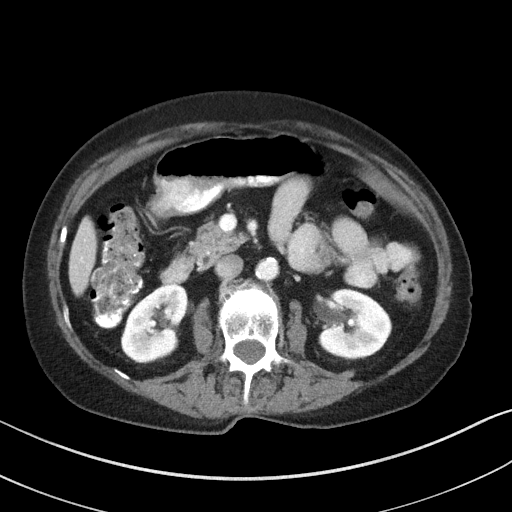
[im 61/97  bone]
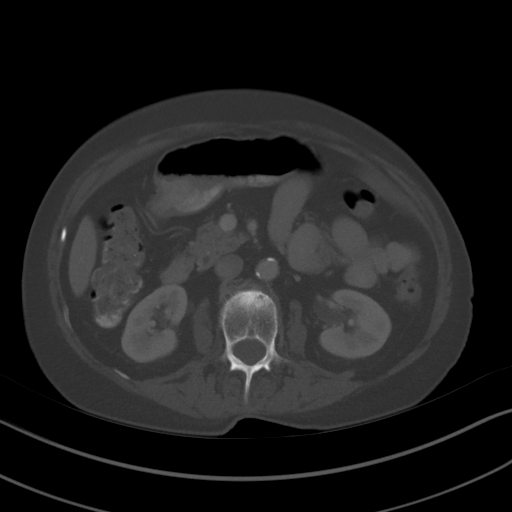
[im 71/97  soft-tissue]
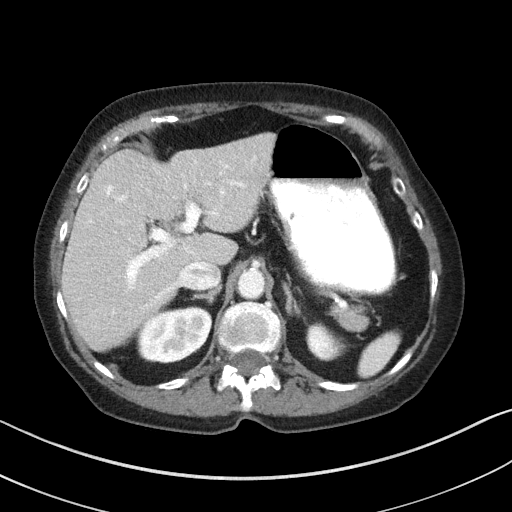
[im 76/97  soft-tissue]
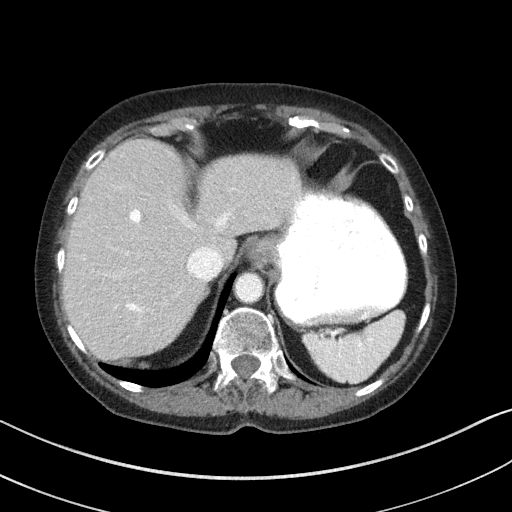
[im 76/97  lung]
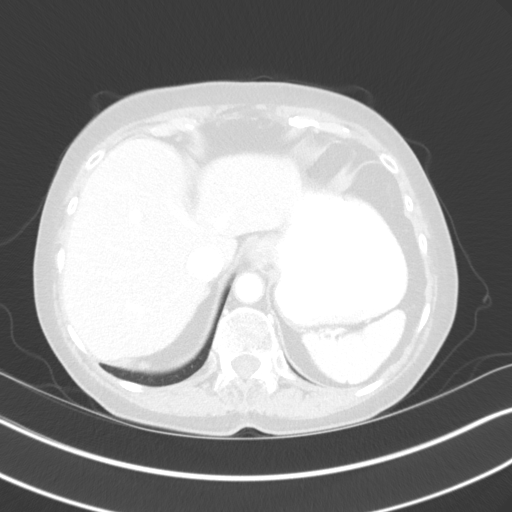
[im 81/97  soft-tissue]
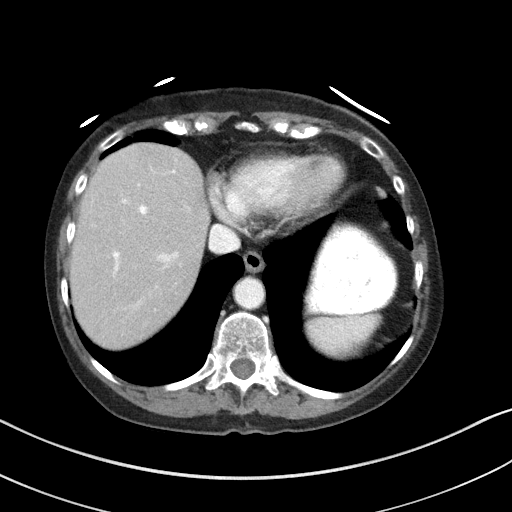
[im 81/97  lung]
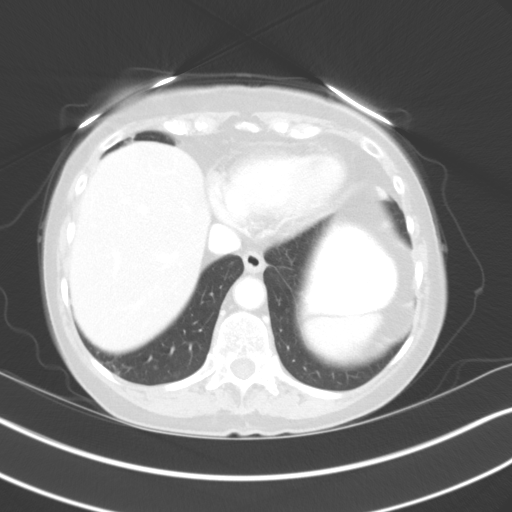
[im 86/97  lung]
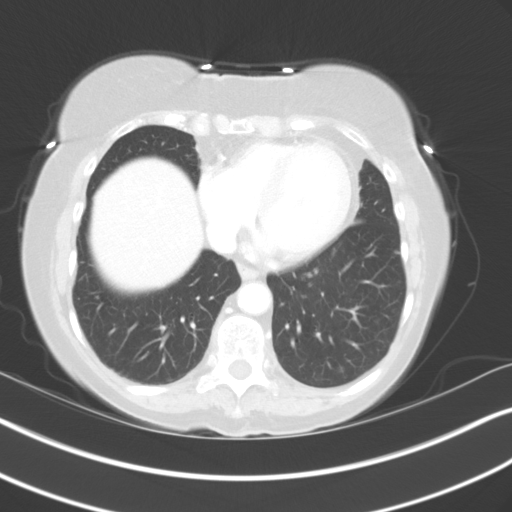
[im 91/97  soft-tissue]
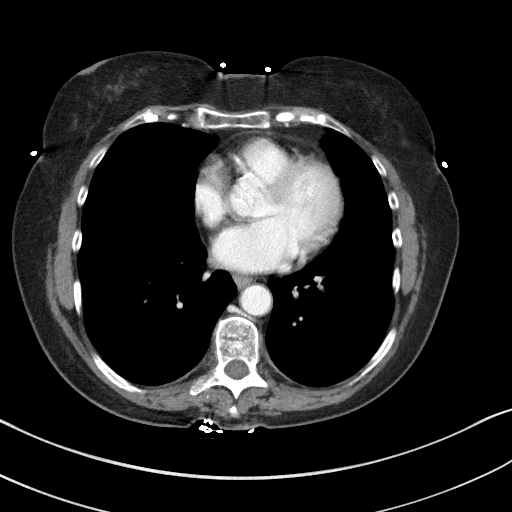
[im 91/97  lung]
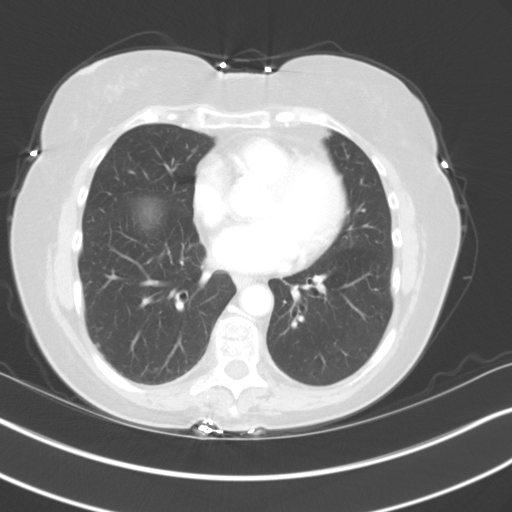

[14 of 32 positions shown; findings below may reference images not displayed]

FINDINGS: Lower chest:  No acute findings.

Hepatobiliary: No masses or other significant abnormality.
Gallbladder is unremarkable.

Pancreas: No mass, inflammatory changes, or other significant
abnormality.

Spleen: Within normal limits in size and appearance.

Adrenals/Urinary Tract: No masses identified. No evidence of
hydronephrosis.

Stomach/Bowel: No evidence of obstruction, inflammatory process, or
abnormal fluid collections. Diverticulosis is again seen involving
the descending and proximal sigmoid colon, however there is no
evidence of diverticulitis.

Vascular/Lymphatic: No pathologically enlarged lymph nodes. No
evidence of abdominal aortic aneurysm. Aortic atherosclerotic plaque
noted.

Reproductive: Prior hysterectomy noted. Adnexal regions are
unremarkable in appearance.

Other: None.

Musculoskeletal:  No suspicious bone lesions identified.
IMPRESSION: Colonic diverticulosis. No radiographic evidence of diverticulitis
or other acute findings.

## 2017-06-16 ENCOUNTER — Other Ambulatory Visit: Payer: Self-pay | Admitting: Internal Medicine

## 2017-06-18 ENCOUNTER — Other Ambulatory Visit: Payer: Self-pay | Admitting: Internal Medicine

## 2017-06-19 NOTE — Telephone Encounter (Signed)
Last OV 04/02/2017 Next OV 06/24/2017 Last refill 04/18/2017

## 2017-06-19 NOTE — Telephone Encounter (Signed)
rx ok'd for temazepam #90 with no refills.

## 2017-06-24 ENCOUNTER — Ambulatory Visit (INDEPENDENT_AMBULATORY_CARE_PROVIDER_SITE_OTHER): Payer: Medicare Other

## 2017-06-24 ENCOUNTER — Encounter: Payer: Self-pay | Admitting: Internal Medicine

## 2017-06-24 ENCOUNTER — Ambulatory Visit (INDEPENDENT_AMBULATORY_CARE_PROVIDER_SITE_OTHER): Payer: Medicare Other | Admitting: Internal Medicine

## 2017-06-24 VITALS — BP 140/78 | HR 71 | Temp 98.1°F | Resp 18 | Wt 142.8 lb

## 2017-06-24 DIAGNOSIS — M5442 Lumbago with sciatica, left side: Secondary | ICD-10-CM | POA: Diagnosis not present

## 2017-06-24 DIAGNOSIS — C73 Malignant neoplasm of thyroid gland: Secondary | ICD-10-CM | POA: Diagnosis not present

## 2017-06-24 DIAGNOSIS — F32A Depression, unspecified: Secondary | ICD-10-CM

## 2017-06-24 DIAGNOSIS — M5441 Lumbago with sciatica, right side: Secondary | ICD-10-CM | POA: Diagnosis not present

## 2017-06-24 DIAGNOSIS — R1310 Dysphagia, unspecified: Secondary | ICD-10-CM

## 2017-06-24 DIAGNOSIS — R739 Hyperglycemia, unspecified: Secondary | ICD-10-CM | POA: Diagnosis not present

## 2017-06-24 DIAGNOSIS — M791 Myalgia, unspecified site: Secondary | ICD-10-CM

## 2017-06-24 DIAGNOSIS — I6523 Occlusion and stenosis of bilateral carotid arteries: Secondary | ICD-10-CM

## 2017-06-24 DIAGNOSIS — I10 Essential (primary) hypertension: Secondary | ICD-10-CM

## 2017-06-24 DIAGNOSIS — M47816 Spondylosis without myelopathy or radiculopathy, lumbar region: Secondary | ICD-10-CM | POA: Diagnosis not present

## 2017-06-24 DIAGNOSIS — Z8673 Personal history of transient ischemic attack (TIA), and cerebral infarction without residual deficits: Secondary | ICD-10-CM | POA: Diagnosis not present

## 2017-06-24 DIAGNOSIS — I2510 Atherosclerotic heart disease of native coronary artery without angina pectoris: Secondary | ICD-10-CM

## 2017-06-24 DIAGNOSIS — F329 Major depressive disorder, single episode, unspecified: Secondary | ICD-10-CM | POA: Diagnosis not present

## 2017-06-24 DIAGNOSIS — I700 Atherosclerosis of aorta: Secondary | ICD-10-CM

## 2017-06-24 DIAGNOSIS — E7800 Pure hypercholesterolemia, unspecified: Secondary | ICD-10-CM | POA: Diagnosis not present

## 2017-06-24 LAB — CBC WITH DIFFERENTIAL/PLATELET
BASOS ABS: 0 10*3/uL (ref 0.0–0.1)
Basophils Relative: 0.8 % (ref 0.0–3.0)
EOS ABS: 0 10*3/uL (ref 0.0–0.7)
Eosinophils Relative: 0.9 % (ref 0.0–5.0)
HEMATOCRIT: 35.4 % — AB (ref 36.0–46.0)
Hemoglobin: 11.9 g/dL — ABNORMAL LOW (ref 12.0–15.0)
LYMPHS PCT: 23.9 % (ref 12.0–46.0)
Lymphs Abs: 1.2 10*3/uL (ref 0.7–4.0)
MCHC: 33.5 g/dL (ref 30.0–36.0)
MCV: 84.7 fl (ref 78.0–100.0)
MONOS PCT: 7.3 % (ref 3.0–12.0)
Monocytes Absolute: 0.4 10*3/uL (ref 0.1–1.0)
Neutro Abs: 3.3 10*3/uL (ref 1.4–7.7)
Neutrophils Relative %: 67.1 % (ref 43.0–77.0)
Platelets: 330 10*3/uL (ref 150.0–400.0)
RBC: 4.18 Mil/uL (ref 3.87–5.11)
RDW: 15 % (ref 11.5–15.5)
WBC: 4.9 10*3/uL (ref 4.0–10.5)

## 2017-06-24 LAB — BASIC METABOLIC PANEL
BUN: 18 mg/dL (ref 6–23)
CHLORIDE: 102 meq/L (ref 96–112)
CO2: 29 mEq/L (ref 19–32)
CREATININE: 0.87 mg/dL (ref 0.40–1.20)
Calcium: 8.9 mg/dL (ref 8.4–10.5)
GFR: 65.4 mL/min (ref >60.00)
GLUCOSE: 141 mg/dL — AB (ref 70–99)
POTASSIUM: 4.2 meq/L (ref 3.5–5.1)
Sodium: 138 mEq/L (ref 135–145)

## 2017-06-24 LAB — CK: Total CK: 68 U/L (ref 7–177)

## 2017-06-24 LAB — SEDIMENTATION RATE: SED RATE: 17 mm/h (ref 0–30)

## 2017-06-24 LAB — HEPATIC FUNCTION PANEL
ALBUMIN: 4.1 g/dL (ref 3.5–5.2)
ALK PHOS: 60 U/L (ref 39–117)
ALT: 11 U/L (ref 0–35)
AST: 17 U/L (ref 0–37)
BILIRUBIN DIRECT: 0.1 mg/dL (ref 0.0–0.3)
TOTAL PROTEIN: 7.1 g/dL (ref 6.0–8.3)
Total Bilirubin: 0.4 mg/dL (ref 0.2–1.2)

## 2017-06-24 LAB — HEMOGLOBIN A1C: Hgb A1c MFr Bld: 6.2 % (ref 4.6–6.5)

## 2017-06-24 MED ORDER — GABAPENTIN 100 MG PO CAPS: 100.0000 mg | ORAL_CAPSULE | Freq: Two times a day (BID) | ORAL | 1 refills | Status: DC | PRN

## 2017-06-24 NOTE — Progress Notes (Signed)
Patient ID: Tamara [name], female   DOB: Feb 10, 1927, 84 y.o.   MRN: [id number]   Subjective:    Patient ID: Tamara [name], female    DOB: May 17, 1927, 84 y.o.   MRN: [id number]  HPI  Patient here for a scheduled follow up.  Is s/p total thyroidectomy for papillary thyroid cancer on 09/16/16.  On levothyroxine.  Evaluated by endocrinology 05/02/17.  At that time reported dysphagia.  Had ultrasound.  States ok.  Discussed with her today.  Discussed further evaluation for the dysphagia.  She declines.  Feels she is controlling.  Desires no further evaluation, w/up or treatment.  No chest pain.  Breathing stable.  She does report increased joint and initially reported muscle aches.  On questioning, she reports increased lower back pian and pain going down into her buttock and leg.  Describes a stinging sensation.  Worsened recently.  Some aching in her shoulders and arms, but states more pain is localized in lower back/buttock/leg.  Helps to move.  Has taken some advil.  Has allergy to tylenol.  No abdominal pain.  Bowels moving.  Eating.  States blood sugars averaging 140s/70s.     Past Medical History:  Diagnosis Date  . Anemia   . Arthritis   . Chicken pox   . Colitis   . Degenerative joint disease   . Diverticulitis   . Diverticulosis   . Fibrocystic breast disease   . GERD (gastroesophageal reflux disease)    EGD - gastritis/mild duodenitis, previous positive H. pylori  . Glaucoma   . Heart murmur   . Helicobacter pylori ab+   . History of depression   . History of skin cancer    facial  . Hypercholesterolemia   . Hypertension   . IBS (irritable bowel syndrome)   . Internal hemorrhoids   . Migraines   . Osteopenia   . Stroke ([place])   . Thyroid disease   . Tubular adenoma of colon   . Vitamin D deficiency    Past Surgical History:  Procedure Laterality Date  . ABDOMINAL HYSTERECTOMY  1973   secondary to bleeding  . APPENDECTOMY    . BILATERAL SALPINGOOPHORECTOMY  2004   complex ovarian cyst (left) - benign  . BREAST BIOPSY Bilateral    x4  . MANDIBLE FRACTURE SURGERY    . VENTRAL HERNIA REPAIR  2005   Dr. Tamala Julian   Family History  Problem Relation Age of Onset  . Esophageal cancer Mother   . Stomach cancer Mother   . Heart disease Father   . Cirrhosis Brother   . Parkinson's disease Brother   . Ovarian cancer Sister   . Colon cancer Sister        half-sister  . Migraines Daughter   . Heart attack Son    Social History   Socioeconomic History  . Marital status: Widowed    Spouse name: Not on file  . Number of children: 2  . Years of education: Not on file  . Highest education level: Not on file  Occupational History  . Occupation: retired  Scientific laboratory technician  . Financial resource strain: Not hard at all  . Food insecurity:    Worry: Never true    Inability: Never true  . Transportation needs:    Medical: No    Non-medical: No  Tobacco Use  . Smoking status: Never Smoker  . Smokeless tobacco: Never Used  Substance and Sexual Activity  . Alcohol use: No    Alcohol/week:  0.0 oz  . Drug use: No  . Sexual activity: Never  Lifestyle  . Physical activity:    Days per week: Not on file    Minutes per session: Not on file  . Stress: Not at all  Relationships  . Social connections:    Talks on phone: Patient refused    Gets together: Patient refused    Attends religious service: Patient refused    Active member of club or organization: Patient refused    Attends meetings of clubs or organizations: Patient refused    Relationship status: Patient refused  Other Topics Concern  . Not on file  Social History Narrative  . Not on file    Outpatient Encounter Medications as of 06/24/2017  Medication Sig  . aspirin 81 MG tablet Take 81 mg by mouth daily.  . clopidogrel (PLAVIX) 75 MG tablet TAKE 1 TABLET EVERY DAY  . gabapentin (NEURONTIN) 100 MG capsule Take 1 capsule (100 mg total) by mouth 2 (two) times daily as needed.  [name] levothyroxine  (SYNTHROID, LEVOTHROID) 88 MCG tablet Take 75 mcg by mouth daily.   [name] losartan (COZAAR) 50 MG tablet Take 1 tablet (50 mg total) daily by mouth.  [name] PARoxetine (PAXIL) 30 MG tablet TAKE 1 TABLET EVERY DAY  . rosuvastatin (CRESTOR) 40 MG tablet TAKE 1 TABLET BY MOUTH DAILY  . temazepam (RESTORIL) 15 MG capsule TAKE 1 CAPSULE BY MOUTH AT BEDTIME   No facility-administered encounter medications on file as of 06/24/2017.     Review of Systems  Constitutional: Negative for appetite change and unexpected weight change.  HENT: Negative for congestion and sinus pressure.   Respiratory: Negative for cough, chest tightness and shortness of breath.   Cardiovascular: Negative for chest pain, palpitations and leg swelling.  Gastrointestinal: Negative for abdominal pain, diarrhea, nausea and vomiting.  Genitourinary: Negative for difficulty urinating and dysuria.  Musculoskeletal: Negative for myalgias.       Low back pain and pain and stinging sensation into buttock and legs as outlined.  Some shoulder discomfort.    Skin: Negative for color change and rash.  Neurological: Negative for dizziness, light-headedness and headaches.  Psychiatric/Behavioral: Negative for agitation and dysphoric mood.       Objective:    Physical Exam  Constitutional: She appears well-developed and well-nourished. No distress.  HENT:  Nose: Nose normal.  Mouth/Throat: Oropharynx is clear and moist.  Neck: Neck supple. No thyromegaly present.  Cardiovascular: Normal rate and regular rhythm.  Pulmonary/Chest: Breath sounds normal. No respiratory distress. She has no wheezes.  Abdominal: Soft. Bowel sounds are normal. There is no tenderness.  Musculoskeletal: She exhibits no edema or tenderness.  Increased pain in lower back with SLR.    Lymphadenopathy:    She has no cervical adenopathy.  Skin: No rash noted. No erythema.  Psychiatric: She has a normal mood and affect. Her behavior is normal.    BP 140/78 (BP  Location: Left Arm, Patient Position: Sitting)   Pulse 71   Temp 98.1 F (36.7 C) (Oral)   Resp 18   Wt 142 lb 12.8 oz (64.8 kg)   LMP 01/17/1972   SpO2 96%   BMI 24.51 kg/m  Wt Readings from Last 3 Encounters:  06/24/17 142 lb 12.8 oz (64.8 kg)  04/02/17 140 lb 8 oz (63.7 kg)  03/14/17 143 lb 3.2 oz (65 kg)     Lab Results  Component Value Date   WBC 4.9 06/24/2017   HGB 11.9 (L) 06/24/2017  HCT 35.4 (L) 06/24/2017   PLT 330.0 06/24/2017   GLUCOSE 141 (H) 06/24/2017   CHOL 190 03/12/2017   TRIG 81.0 03/12/2017   HDL 68.90 03/12/2017   LDLDIRECT 177.7 01/19/2013   LDLCALC 105 (H) 03/12/2017   ALT 11 06/24/2017   AST 17 06/24/2017   NA 138 06/24/2017   K 4.2 06/24/2017   CL 102 06/24/2017   CREATININE 0.87 06/24/2017   BUN 18 06/24/2017   CO2 29 06/24/2017   TSH 0.12 (L) 12/09/2016   HGBA1C 6.2 06/24/2017    Mm Screening Breast Tomo Bilateral  Result Date: 02/07/2017 CLINICAL DATA:  Screening. EXAM: 2D DIGITAL SCREENING BILATERAL MAMMOGRAM WITH 3D TOMO WITH CAD COMPARISON:  Previous exam(s). ACR Breast Density Category b: There are scattered areas of fibroglandular density. FINDINGS: There are no findings suspicious for malignancy. Images were processed with CAD. IMPRESSION: No mammographic evidence of malignancy. A result letter of this screening mammogram will be mailed directly to the patient. RECOMMENDATION: Screening mammogram in one year. (Code:SM-B-01Y) BI-RADS CATEGORY  1: Negative. Electronically Signed   By: Nolon Nations M.D.   On: 02/07/2017 14:11       Assessment & Plan:   Problem List Items Addressed This Visit    Aortic atherosclerosis ([place])    On crestor.       Back pain - Primary    Symptoms as outlined.  Appears to be more localized to low back.  Will check esr and ck.  Continue crestor.  Check L-S spine xray.  With the burning sensation and pain, will give her a trial of neurontin. Follow.        Relevant Orders   DG Lumbar Spine 2-3  Views (Completed)   CAD (coronary artery disease)    Known CAD.  Currently asymptomatic.  Continue risk factor modification.        Depression    Doing well on current regimen.  Follow.        Dysphagia    Previous h/o dysphagia s/p EGD and dilatation.  Now with return of dysphagia.  Discussed with endocrinology.  Had ultrasound.  Discussed with her today.  Discussed my desire to pursue further evaluation and treatment.  Discussed referral back to GI.  She declines.  Wants to monitor.        History of CVA (cerebrovascular accident)    Stable on aspirin and plavic.  Continue crestor.        Hypercholesterolemia    On crestor.  Low cholesterol diet and exercise.  Follow lipid panel and liver function tests.        Relevant Orders   Hepatic function panel (Completed)   Hyperglycemia    Low carb diet and exercise.  Follow met b and a1c.        Relevant Orders   Hemoglobin A1c (Completed)   Hypertension    Blood pressure as outlined.  continue same medication regimen.  Follow pressures.  Follow metabolic panel.       Relevant Orders   CBC with Differential/Platelet (Completed)   Basic metabolic panel (Completed)   Thyroid cancer ([place])    S/p total thyroidectomy - papillary carcinoma - 08/2016.  On thyroid replacement.  Continues f/u with Dr Gabriel Carina.         Other Visit Diagnoses    Muscle ache       Relevant Orders   Sedimentation rate (Completed)   CK (Creatine Kinase) (Completed)       Einar Pheasant, MD

## 2017-06-25 ENCOUNTER — Encounter: Payer: Self-pay | Admitting: Internal Medicine

## 2017-06-25 ENCOUNTER — Other Ambulatory Visit: Payer: Self-pay | Admitting: Internal Medicine

## 2017-06-25 ENCOUNTER — Other Ambulatory Visit (INDEPENDENT_AMBULATORY_CARE_PROVIDER_SITE_OTHER): Payer: Medicare Other

## 2017-06-25 DIAGNOSIS — D649 Anemia, unspecified: Secondary | ICD-10-CM | POA: Diagnosis not present

## 2017-06-25 DIAGNOSIS — R200 Anesthesia of skin: Secondary | ICD-10-CM

## 2017-06-25 LAB — VITAMIN B12: VITAMIN B 12: 168 pg/mL — AB (ref 211–911)

## 2017-06-25 LAB — IBC PANEL
Iron: 90 ug/dL (ref 42–145)
SATURATION RATIOS: 23.4 % (ref 20.0–50.0)
Transferrin: 275 mg/dL (ref 212.0–360.0)

## 2017-06-25 LAB — FERRITIN: FERRITIN: 16.2 ng/mL (ref 10.0–291.0)

## 2017-06-25 NOTE — Assessment & Plan Note (Signed)
Symptoms as outlined.  Appears to be more localized to low back.  Will check esr and ck.  Continue crestor.  Check L-S spine xray.  With the burning sensation and pain, will give her a trial of neurontin. Follow.   

## 2017-06-25 NOTE — Assessment & Plan Note (Signed)
On crestor.  Low cholesterol diet and exercise.  Follow lipid panel and liver function tests.   

## 2017-06-25 NOTE — Assessment & Plan Note (Signed)
Previous h/o dysphagia s/p EGD and dilatation.  Now with return of dysphagia.  Discussed with endocrinology.  Had ultrasound.  Discussed with her today.  Discussed my desire to pursue further evaluation and treatment.  Discussed referral back to GI.  She declines.  Wants to monitor.

## 2017-06-25 NOTE — Assessment & Plan Note (Signed)
On crestor.   

## 2017-06-25 NOTE — Assessment & Plan Note (Signed)
Stable on aspirin and plavic.  Continue crestor.

## 2017-06-25 NOTE — Assessment & Plan Note (Signed)
Known CAD.  Currently asymptomatic.  Continue risk factor modification.

## 2017-06-25 NOTE — Assessment & Plan Note (Signed)
S/p total thyroidectomy - papillary carcinoma - 08/2016.  On thyroid replacement.  Continues f/u with Dr Gabriel Carina.

## 2017-06-25 NOTE — Assessment & Plan Note (Signed)
Doing well on current regimen.  Follow.   

## 2017-06-25 NOTE — Progress Notes (Signed)
Order placed for add on lab.  °

## 2017-06-25 NOTE — Assessment & Plan Note (Signed)
Low carb diet and exercise.  Follow met b and a1c.   

## 2017-06-25 NOTE — Assessment & Plan Note (Signed)
Blood pressure as outlined.  continue same medication regimen.  Follow pressures.  Follow metabolic panel.

## 2017-06-26 ENCOUNTER — Telehealth: Payer: Self-pay | Admitting: Cardiovascular Disease

## 2017-06-26 MED ORDER — CLOPIDOGREL BISULFATE 75 MG PO TABS: 75.0000 mg | ORAL_TABLET | Freq: Every day | ORAL | 3 refills | Status: DC

## 2017-06-26 NOTE — Telephone Encounter (Signed)
°*  STAT* If patient is at the pharmacy, call can be transferred to refill team.   1. Which medications need to be refilled? (please list name of each medication and dose if known)  Plavix   2. Which pharmacy/location (including street and city if local pharmacy) is medication to be sent to? [facility]   3. Do they need a 30 day or 90 day supply? 90 day

## 2017-07-07 ENCOUNTER — Telehealth: Payer: Self-pay

## 2017-07-07 NOTE — Telephone Encounter (Signed)
Pt scheduled for 7/8 @ 12

## 2017-07-07 NOTE — Telephone Encounter (Signed)
Copied from [place] [phone]. Topic: General - Other >> Jul 07, 2017 12:09 PM Carolyn Stare wrote:  Pt call to say Dr Nicki Reaper wanted her to come back in 6 weeks and there is no availablity. Would like a call back as to when she can come in   [phone]

## 2017-08-04 ENCOUNTER — Encounter: Payer: Self-pay | Admitting: Internal Medicine

## 2017-08-04 ENCOUNTER — Ambulatory Visit (INDEPENDENT_AMBULATORY_CARE_PROVIDER_SITE_OTHER): Payer: Medicare Other | Admitting: Internal Medicine

## 2017-08-04 VITALS — BP 128/70 | HR 72 | Temp 98.2°F | Resp 16 | Ht 64.0 in | Wt 141.2 lb

## 2017-08-04 DIAGNOSIS — R739 Hyperglycemia, unspecified: Secondary | ICD-10-CM | POA: Diagnosis not present

## 2017-08-04 DIAGNOSIS — M5441 Lumbago with sciatica, right side: Secondary | ICD-10-CM | POA: Diagnosis not present

## 2017-08-04 DIAGNOSIS — I10 Essential (primary) hypertension: Secondary | ICD-10-CM | POA: Diagnosis not present

## 2017-08-04 DIAGNOSIS — F32A Depression, unspecified: Secondary | ICD-10-CM

## 2017-08-04 DIAGNOSIS — E7800 Pure hypercholesterolemia, unspecified: Secondary | ICD-10-CM | POA: Diagnosis not present

## 2017-08-04 DIAGNOSIS — C73 Malignant neoplasm of thyroid gland: Secondary | ICD-10-CM | POA: Diagnosis not present

## 2017-08-04 DIAGNOSIS — D649 Anemia, unspecified: Secondary | ICD-10-CM

## 2017-08-04 DIAGNOSIS — Z8673 Personal history of transient ischemic attack (TIA), and cerebral infarction without residual deficits: Secondary | ICD-10-CM

## 2017-08-04 DIAGNOSIS — M5442 Lumbago with sciatica, left side: Secondary | ICD-10-CM

## 2017-08-04 DIAGNOSIS — I700 Atherosclerosis of aorta: Secondary | ICD-10-CM | POA: Diagnosis not present

## 2017-08-04 DIAGNOSIS — I6523 Occlusion and stenosis of bilateral carotid arteries: Secondary | ICD-10-CM | POA: Diagnosis not present

## 2017-08-04 DIAGNOSIS — F329 Major depressive disorder, single episode, unspecified: Secondary | ICD-10-CM

## 2017-08-04 DIAGNOSIS — I2510 Atherosclerotic heart disease of native coronary artery without angina pectoris: Secondary | ICD-10-CM | POA: Diagnosis not present

## 2017-08-04 MED ORDER — PRAVASTATIN SODIUM 20 MG PO TABS: 20.0000 mg | ORAL_TABLET | Freq: Every day | ORAL | 3 refills | Status: DC

## 2017-08-04 MED ORDER — GABAPENTIN 100 MG PO CAPS: 100.0000 mg | ORAL_CAPSULE | Freq: Two times a day (BID) | ORAL | 1 refills | Status: DC | PRN

## 2017-08-04 NOTE — Progress Notes (Signed)
Patient ID: Tamara [name], female   DOB: October 15, 1927, 84 y.o.   MRN: [id number]   Subjective:    Patient ID: Tamara [name], female    DOB: [birth year], 84 y.o.   MRN: [id number]  HPI  Patient here for a scheduled follow up.  Last visit, she reported increased low back pain and pain going down her leg.  She was placed on gabapentin.  Also stopped her crestor.  States the back pain and leg pain is better.  Also reports the aching she was experiencing resolved.  Feels much better off crestor.  Takes gabapentin prn.  Overall feels better.  Discussed the need for treatment of her cholesterol given history.  She has been tried on several statin medications.  No chest pain.  Breathing stable.  No acid reflux.  No abdominal pain.  Bowels moving.  States her blood pressure has been doing well.     Past Medical History:  Diagnosis Date  . Anemia   . Arthritis   . Chicken pox   . Colitis   . Degenerative joint disease   . Diverticulitis   . Diverticulosis   . Fibrocystic breast disease   . GERD (gastroesophageal reflux disease)    EGD - gastritis/mild duodenitis, previous positive H. pylori  . Glaucoma   . Heart murmur   . Helicobacter pylori ab+   . History of depression   . History of skin cancer    facial  . Hypercholesterolemia   . Hypertension   . IBS (irritable bowel syndrome)   . Internal hemorrhoids   . Migraines   . Osteopenia   . Stroke ([place])   . Thyroid disease   . Tubular adenoma of colon   . Vitamin D deficiency    Past Surgical History:  Procedure Laterality Date  . ABDOMINAL HYSTERECTOMY  1973   secondary to bleeding  . APPENDECTOMY    . BILATERAL SALPINGOOPHORECTOMY  2004   complex ovarian cyst (left) - benign  . BREAST BIOPSY Bilateral    x4  . MANDIBLE FRACTURE SURGERY    . VENTRAL HERNIA REPAIR  2005   Dr. Tamala Julian   Family History  Problem Relation Age of Onset  . Esophageal cancer Mother   . Stomach cancer Mother   . Heart disease Father   . Cirrhosis  Brother   . Parkinson's disease Brother   . Ovarian cancer Sister   . Colon cancer Sister        half-sister  . Migraines Daughter   . Heart attack Son    Social History   Socioeconomic History  . Marital status: Widowed    Spouse name: Not on file  . Number of children: 2  . Years of education: Not on file  . Highest education level: Not on file  Occupational History  . Occupation: retired  Scientific laboratory technician  . Financial resource strain: Not hard at all  . Food insecurity:    Worry: Never true    Inability: Never true  . Transportation needs:    Medical: No    Non-medical: No  Tobacco Use  . Smoking status: Never Smoker  . Smokeless tobacco: Never Used  Substance and Sexual Activity  . Alcohol use: No    Alcohol/week: 0.0 oz  . Drug use: No  . Sexual activity: Never  Lifestyle  . Physical activity:    Days per week: Not on file    Minutes per session: Not on file  . Stress: Not at all  Relationships  . Social connections:    Talks on phone: Patient refused    Gets together: Patient refused    Attends religious service: Patient refused    Active member of club or organization: Patient refused    Attends meetings of clubs or organizations: Patient refused    Relationship status: Patient refused  Other Topics Concern  . Not on file  Social History Narrative  . Not on file    Outpatient Encounter Medications as of 08/04/2017  Medication Sig  . aspirin 81 MG tablet Take 81 mg by mouth daily.  . clopidogrel (PLAVIX) 75 MG tablet Take 1 tablet (75 mg total) by mouth daily.  [name] levothyroxine (SYNTHROID, LEVOTHROID) 88 MCG tablet Take 75 mcg by mouth daily.   [name] losartan (COZAAR) 50 MG tablet Take 1 tablet (50 mg total) daily by mouth.  [name] PARoxetine (PAXIL) 30 MG tablet TAKE 1 TABLET EVERY DAY  . rosuvastatin (CRESTOR) 40 MG tablet TAKE 1 TABLET BY MOUTH DAILY  . temazepam (RESTORIL) 15 MG capsule TAKE 1 CAPSULE BY MOUTH AT BEDTIME  . gabapentin (NEURONTIN) 100 MG capsule  Take 1 capsule (100 mg total) by mouth 2 (two) times daily as needed.  . pravastatin (PRAVACHOL) 20 MG tablet Take 1 tablet (20 mg total) by mouth daily.  . [DISCONTINUED] gabapentin (NEURONTIN) 100 MG capsule Take 1 capsule (100 mg total) by mouth 2 (two) times daily as needed. (Patient not taking: Reported on 08/04/2017)   No facility-administered encounter medications on file as of 08/04/2017.     Review of Systems  Constitutional: Negative for appetite change and unexpected weight change.  HENT: Negative for congestion and sinus pressure.   Respiratory: Negative for cough, chest tightness and shortness of breath.   Cardiovascular: Negative for chest pain, palpitations and leg swelling.  Gastrointestinal: Negative for abdominal pain, diarrhea, nausea and vomiting.  Genitourinary: Negative for difficulty urinating and dysuria.  Musculoskeletal: Negative for joint swelling and myalgias.       Back and leg pain improved.  Aching resolve.    Skin: Negative for color change and rash.  Neurological: Negative for dizziness, light-headedness and headaches.  Psychiatric/Behavioral: Negative for agitation and dysphoric mood.       Objective:    Physical Exam  Constitutional: She appears well-developed and well-nourished. No distress.  HENT:  Nose: Nose normal.  Mouth/Throat: Oropharynx is clear and moist.  Neck: Neck supple. No thyromegaly present.  Cardiovascular: Normal rate and regular rhythm.  Pulmonary/Chest: Breath sounds normal. No respiratory distress. She has no wheezes.  Abdominal: Soft. Bowel sounds are normal. There is no tenderness.  Musculoskeletal: She exhibits no edema or tenderness.  Lymphadenopathy:    She has no cervical adenopathy.  Skin: No rash noted. No erythema.  Psychiatric: She has a normal mood and affect. Her behavior is normal.    BP 128/70 (BP Location: Left Arm, Patient Position: Sitting, Cuff Size: Normal)   Pulse 72   Temp 98.2 F (36.8 C) (Oral)    Resp 16   Ht _0  (1.626 m)   Wt 141 lb 4 oz (64.1 kg)   LMP 01/17/1972   SpO2 95%   BMI 24.25 kg/m  Wt Readings from Last 3 Encounters:  08/04/17 141 lb 4 oz (64.1 kg)  06/24/17 142 lb 12.8 oz (64.8 kg)  04/02/17 140 lb 8 oz (63.7 kg)     Lab Results  Component Value Date   WBC 4.9 06/24/2017   HGB 11.9 (L) 06/24/2017  HCT 35.4 (L) 06/24/2017   PLT 330.0 06/24/2017   GLUCOSE 141 (H) 06/24/2017   CHOL 190 03/12/2017   TRIG 81.0 03/12/2017   HDL 68.90 03/12/2017   LDLDIRECT 177.7 01/19/2013   LDLCALC 105 (H) 03/12/2017   ALT 11 06/24/2017   AST 17 06/24/2017   NA 138 06/24/2017   K 4.2 06/24/2017   CL 102 06/24/2017   CREATININE 0.87 06/24/2017   BUN 18 06/24/2017   CO2 29 06/24/2017   TSH 0.12 (L) 12/09/2016   HGBA1C 6.2 06/24/2017    Mm Screening Breast Tomo Bilateral  Result Date: 02/07/2017 CLINICAL DATA:  Screening. EXAM: 2D DIGITAL SCREENING BILATERAL MAMMOGRAM WITH 3D TOMO WITH CAD COMPARISON:  Previous exam(s). ACR Breast Density Category b: There are scattered areas of fibroglandular density. FINDINGS: There are no findings suspicious for malignancy. Images were processed with CAD. IMPRESSION: No mammographic evidence of malignancy. A result letter of this screening mammogram will be mailed directly to the patient. RECOMMENDATION: Screening mammogram in one year. (Code:SM-B-01Y) BI-RADS CATEGORY  1: Negative. Electronically Signed   By: Nolon Nations M.D.   On: 02/07/2017 14:11       Assessment & Plan:   Problem List Items Addressed This Visit    Aortic atherosclerosis ([place])    Off crestor.  Start pravastatin.       Relevant Medications   pravastatin (PRAVACHOL) 20 MG tablet   Back pain    Back and leg pain improved.  rx given for gabapentin if needed.  Aching improved after stopping crestor.  Follow.        CAD (coronary artery disease)    Currently asymptomatic.       Relevant Medications   pravastatin (PRAVACHOL) 20 MG tablet   Depression     Doing well on current regimen.  Follow.        History of CVA (cerebrovascular accident)    Stable on aspirin and plavix.  Follow.  Treat cholesterol.        Hypercholesterolemia    Intolerant to crestor.  Has tried multiple statins.  Discussed trial of pravastatin.  Has been on previously.  Start pravastatin.  Low cholesterol diet and exercise.        Relevant Medications   pravastatin (PRAVACHOL) 20 MG tablet   Other Relevant Orders   Hepatic function panel   Lipid panel   Hyperglycemia    Low carb diet and exercise.  Follow met b and a1c.       Relevant Orders   Hemoglobin A1c   Hypertension    Blood pressure as outlined.  Same medication regimen. Follow pressures.  Follow metabolic panel.        Relevant Medications   pravastatin (PRAVACHOL) 20 MG tablet   Other Relevant Orders   Basic metabolic panel   Thyroid cancer ([place])    S/p total thyroidectomy - papillary carcinoma - 08/2016.  On thyroid replacement.  Continue f/u with Dr Gabriel Carina.        Relevant Orders   TSH    Other Visit Diagnoses    Anemia, unspecified type    -  Primary   Relevant Orders   CBC with Differential/Platelet   Ferritin       Einar Pheasant, MD

## 2017-08-04 NOTE — Progress Notes (Signed)
Pre-visit discussion using our clinic review tool. No additional management support is needed unless otherwise documented below in the visit note.  

## 2017-08-07 ENCOUNTER — Encounter: Payer: Self-pay | Admitting: Internal Medicine

## 2017-08-07 NOTE — Assessment & Plan Note (Signed)
Blood pressure as outlined.  Same medication regimen.  Follow pressures.  Follow metabolic panel.  

## 2017-08-07 NOTE — Assessment & Plan Note (Signed)
Off crestor.  Start pravastatin.

## 2017-08-07 NOTE — Assessment & Plan Note (Signed)
Intolerant to crestor.  Has tried multiple statins.  Discussed trial of pravastatin.  Has been on previously.  Start pravastatin.  Low cholesterol diet and exercise.

## 2017-08-07 NOTE — Assessment & Plan Note (Signed)
S/p total thyroidectomy - papillary carcinoma - 08/2016.  On thyroid replacement.  Continue f/u with Dr Gabriel Carina.

## 2017-08-07 NOTE — Assessment & Plan Note (Signed)
Stable on aspirin and plavix.  Follow.  Treat cholesterol.

## 2017-08-07 NOTE — Assessment & Plan Note (Signed)
Doing well on current regimen.  Follow.   

## 2017-08-07 NOTE — Assessment & Plan Note (Signed)
Back and leg pain improved.  rx given for gabapentin if needed.  Aching improved after stopping crestor.  Follow.

## 2017-08-07 NOTE — Assessment & Plan Note (Signed)
Currently asymptomatic 

## 2017-08-07 NOTE — Assessment & Plan Note (Signed)
Low carb diet and exercise.  Follow met b and a1c.  

## 2017-08-19 DIAGNOSIS — H0011 Chalazion right upper eyelid: Secondary | ICD-10-CM | POA: Diagnosis not present

## 2017-08-21 ENCOUNTER — Other Ambulatory Visit: Payer: Self-pay | Admitting: Internal Medicine

## 2017-09-15 ENCOUNTER — Other Ambulatory Visit (INDEPENDENT_AMBULATORY_CARE_PROVIDER_SITE_OTHER): Payer: Medicare Other

## 2017-09-15 DIAGNOSIS — D649 Anemia, unspecified: Secondary | ICD-10-CM

## 2017-09-15 DIAGNOSIS — I10 Essential (primary) hypertension: Secondary | ICD-10-CM | POA: Diagnosis not present

## 2017-09-15 DIAGNOSIS — R739 Hyperglycemia, unspecified: Secondary | ICD-10-CM

## 2017-09-15 DIAGNOSIS — E7800 Pure hypercholesterolemia, unspecified: Secondary | ICD-10-CM

## 2017-09-15 DIAGNOSIS — C73 Malignant neoplasm of thyroid gland: Secondary | ICD-10-CM | POA: Diagnosis not present

## 2017-09-15 LAB — BASIC METABOLIC PANEL
BUN: 13 mg/dL (ref 6–23)
CALCIUM: 9.3 mg/dL (ref 8.4–10.5)
CO2: 28 meq/L (ref 19–32)
CREATININE: 0.82 mg/dL (ref 0.40–1.20)
Chloride: 103 mEq/L (ref 96–112)
GFR: 69.98 mL/min (ref >60.00)
GLUCOSE: 92 mg/dL (ref 70–99)
Potassium: 4.4 mEq/L (ref 3.5–5.1)
SODIUM: 139 meq/L (ref 135–145)

## 2017-09-15 LAB — LIPID PANEL
CHOL/HDL RATIO: 4
Cholesterol: 257 mg/dL — ABNORMAL HIGH (ref 0–200)
HDL: 65.7 mg/dL (ref >39.00)
LDL Cholesterol: 174 mg/dL — ABNORMAL HIGH (ref 0–99)
NONHDL: 190.92
Triglycerides: 84 mg/dL (ref 0.0–149.0)
VLDL: 16.8 mg/dL (ref 0.0–40.0)

## 2017-09-15 LAB — CBC WITH DIFFERENTIAL/PLATELET
Basophils Absolute: 0 10*3/uL (ref 0.0–0.1)
Basophils Relative: 0.7 % (ref 0.0–3.0)
EOS PCT: 1.3 % (ref 0.0–5.0)
Eosinophils Absolute: 0.1 10*3/uL (ref 0.0–0.7)
HCT: 35.7 % — ABNORMAL LOW (ref 36.0–46.0)
HEMOGLOBIN: 11.8 g/dL — AB (ref 12.0–15.0)
Lymphocytes Relative: 31.9 % (ref 12.0–46.0)
Lymphs Abs: 1.5 10*3/uL (ref 0.7–4.0)
MCHC: 33.2 g/dL (ref 30.0–36.0)
MCV: 85.8 fl (ref 78.0–100.0)
MONO ABS: 0.4 10*3/uL (ref 0.1–1.0)
Monocytes Relative: 8.2 % (ref 3.0–12.0)
Neutro Abs: 2.7 10*3/uL (ref 1.4–7.7)
Neutrophils Relative %: 57.9 % (ref 43.0–77.0)
Platelets: 344 10*3/uL (ref 150.0–400.0)
RBC: 4.16 Mil/uL (ref 3.87–5.11)
RDW: 14.5 % (ref 11.5–15.5)
WBC: 4.7 10*3/uL (ref 4.0–10.5)

## 2017-09-15 LAB — HEPATIC FUNCTION PANEL
ALK PHOS: 63 U/L (ref 39–117)
ALT: 8 U/L (ref 0–35)
AST: 13 U/L (ref 0–37)
Albumin: 3.9 g/dL (ref 3.5–5.2)
BILIRUBIN DIRECT: 0.1 mg/dL (ref 0.0–0.3)
BILIRUBIN TOTAL: 0.4 mg/dL (ref 0.2–1.2)
Total Protein: 6.7 g/dL (ref 6.0–8.3)

## 2017-09-15 LAB — FERRITIN: FERRITIN: 18.5 ng/mL (ref 10.0–291.0)

## 2017-09-15 LAB — TSH: TSH: 1.08 u[IU]/mL (ref 0.35–4.50)

## 2017-09-15 LAB — HEMOGLOBIN A1C: Hgb A1c MFr Bld: 5.9 % (ref 4.6–6.5)

## 2017-09-18 ENCOUNTER — Telehealth: Payer: Self-pay | Admitting: Internal Medicine

## 2017-09-18 ENCOUNTER — Other Ambulatory Visit: Payer: Self-pay

## 2017-09-18 ENCOUNTER — Other Ambulatory Visit: Payer: Self-pay | Admitting: Internal Medicine

## 2017-09-18 MED ORDER — EZETIMIBE 10 MG PO TABS: 10.0000 mg | ORAL_TABLET | Freq: Every day | ORAL | 1 refills | Status: DC

## 2017-09-18 NOTE — Telephone Encounter (Signed)
Last OV 08/04/17 Next OV 10/20/17 Last refill 06/19/17  Patient has 2 pills left so will need sent in before Dr Nicki Reaper returns.

## 2017-09-18 NOTE — Telephone Encounter (Signed)
Please notify patient that the temazepam prescription  was Refilled for 30 days only  , since it is a controlled substance  And sent to [facility]

## 2017-09-19 NOTE — Telephone Encounter (Signed)
Left message for patient letting her know that medication was sent

## 2017-09-23 ENCOUNTER — Ambulatory Visit (INDEPENDENT_AMBULATORY_CARE_PROVIDER_SITE_OTHER): Payer: Medicare Other

## 2017-09-23 DIAGNOSIS — E538 Deficiency of other specified B group vitamins: Secondary | ICD-10-CM | POA: Diagnosis not present

## 2017-09-23 DIAGNOSIS — E890 Postprocedural hypothyroidism: Secondary | ICD-10-CM | POA: Diagnosis not present

## 2017-09-23 DIAGNOSIS — C73 Malignant neoplasm of thyroid gland: Secondary | ICD-10-CM | POA: Diagnosis not present

## 2017-09-23 MED ORDER — CYANOCOBALAMIN 1000 MCG/ML IJ SOLN
1000.0000 ug | Freq: Once | INTRAMUSCULAR | Status: AC
  Administered 2017-09-23: 1000 ug via INTRAMUSCULAR

## 2017-09-23 NOTE — Progress Notes (Addendum)
b12 injection given in right deltoid patient tolerated well.   Reviewed above information.  Agree with assessment and plan.    Dr Nicki Reaper

## 2017-09-30 DIAGNOSIS — E890 Postprocedural hypothyroidism: Secondary | ICD-10-CM | POA: Diagnosis not present

## 2017-09-30 DIAGNOSIS — Z85850 Personal history of malignant neoplasm of thyroid: Secondary | ICD-10-CM | POA: Diagnosis not present

## 2017-10-08 ENCOUNTER — Ambulatory Visit (INDEPENDENT_AMBULATORY_CARE_PROVIDER_SITE_OTHER): Payer: Medicare Other

## 2017-10-08 ENCOUNTER — Telehealth: Payer: Self-pay

## 2017-10-08 DIAGNOSIS — E538 Deficiency of other specified B group vitamins: Secondary | ICD-10-CM | POA: Diagnosis not present

## 2017-10-08 MED ORDER — CYANOCOBALAMIN 1000 MCG/ML IJ SOLN
1000.0000 ug | Freq: Once | INTRAMUSCULAR | Status: AC
  Administered 2017-10-08: 1000 ug via INTRAMUSCULAR

## 2017-10-08 NOTE — Progress Notes (Addendum)
Patient presented for B 12 injection to left/right deltoid, patient voiced no concerns nor showed any signs of distress during injection.  Reviewed.  Dr Nicki Reaper

## 2017-10-08 NOTE — Telephone Encounter (Signed)
Copied from [place] [id number]. Topic: General - Other >> Oct 08, 2017  9:40 AM Bea Graff, NT wrote: Reason for CRM: Patients daughter called to schedule mom for B-12 injection. She states her mom was suppose to come back every week and hadn't received a call to set that up. Moms last B-12 injection was 8/27. She states her moms energy level is low again and she needs to come in today for the B-12 injection. Scheduled patient for 2pm today with the nurse. Please advise daughter if this should be changed per Dr. Lars Mage orders. Daughter informed if she does not hear anything back to come on in with her mom at 2pm.

## 2017-10-08 NOTE — Telephone Encounter (Signed)
Left detailed message for daughter

## 2017-10-16 ENCOUNTER — Ambulatory Visit (INDEPENDENT_AMBULATORY_CARE_PROVIDER_SITE_OTHER): Payer: Medicare Other

## 2017-10-16 DIAGNOSIS — E538 Deficiency of other specified B group vitamins: Secondary | ICD-10-CM | POA: Diagnosis not present

## 2017-10-16 MED ORDER — CYANOCOBALAMIN 1000 MCG/ML IJ SOLN
1000.0000 ug | Freq: Once | INTRAMUSCULAR | Status: AC
  Administered 2017-10-16: 1000 ug via INTRAMUSCULAR

## 2017-10-16 NOTE — Progress Notes (Signed)
Patient comes in for B 12 injection.  Injected left deltoid.  Patient tolerated injection well.   Reviewed.  Dr Scott 

## 2017-10-20 ENCOUNTER — Ambulatory Visit (INDEPENDENT_AMBULATORY_CARE_PROVIDER_SITE_OTHER): Payer: Medicare Other | Admitting: Internal Medicine

## 2017-10-20 ENCOUNTER — Encounter: Payer: Self-pay | Admitting: Internal Medicine

## 2017-10-20 VITALS — BP 138/78 | HR 68 | Temp 98.0°F | Resp 18 | Wt 142.0 lb

## 2017-10-20 DIAGNOSIS — C73 Malignant neoplasm of thyroid gland: Secondary | ICD-10-CM

## 2017-10-20 DIAGNOSIS — F329 Major depressive disorder, single episode, unspecified: Secondary | ICD-10-CM | POA: Diagnosis not present

## 2017-10-20 DIAGNOSIS — E7800 Pure hypercholesterolemia, unspecified: Secondary | ICD-10-CM | POA: Diagnosis not present

## 2017-10-20 DIAGNOSIS — F32A Depression, unspecified: Secondary | ICD-10-CM

## 2017-10-20 DIAGNOSIS — I10 Essential (primary) hypertension: Secondary | ICD-10-CM

## 2017-10-20 DIAGNOSIS — I2510 Atherosclerotic heart disease of native coronary artery without angina pectoris: Secondary | ICD-10-CM

## 2017-10-20 DIAGNOSIS — I6523 Occlusion and stenosis of bilateral carotid arteries: Secondary | ICD-10-CM | POA: Diagnosis not present

## 2017-10-20 DIAGNOSIS — Z8673 Personal history of transient ischemic attack (TIA), and cerebral infarction without residual deficits: Secondary | ICD-10-CM

## 2017-10-20 DIAGNOSIS — E538 Deficiency of other specified B group vitamins: Secondary | ICD-10-CM | POA: Diagnosis not present

## 2017-10-20 DIAGNOSIS — I700 Atherosclerosis of aorta: Secondary | ICD-10-CM | POA: Diagnosis not present

## 2017-10-20 DIAGNOSIS — R739 Hyperglycemia, unspecified: Secondary | ICD-10-CM

## 2017-10-20 DIAGNOSIS — Z23 Encounter for immunization: Secondary | ICD-10-CM

## 2017-10-20 MED ORDER — TEMAZEPAM 15 MG PO CAPS: ORAL_CAPSULE | ORAL | 1 refills | Status: DC

## 2017-10-20 MED ORDER — PAROXETINE HCL 40 MG PO TABS: 40.0000 mg | ORAL_TABLET | Freq: Every day | ORAL | 2 refills | Status: DC

## 2017-10-20 MED ORDER — CYANOCOBALAMIN 1000 MCG/ML IJ SOLN
1000.0000 ug | Freq: Once | INTRAMUSCULAR | Status: AC
  Administered 2017-10-20: 1000 ug via INTRAMUSCULAR

## 2017-10-20 NOTE — Progress Notes (Signed)
Patient ID: Tamara [name], female   DOB: [birth year], 84 y.o.   MRN: [id number]   Subjective:    Patient ID: Tamara [name], female    DOB: 1927/11/17, 84 y.o.   MRN: [id number]  HPI  Patient here for a scheduled follow up.  She is s/p total thyroidectomy for papillary thyroid cancer 09/16/16.  On levothyroxine.  Neck ultrasound unremarkable.  Goal tsh goal .3 - 2.0.  She is accompanied by her daughter.  History obtained from both of them.  Reports increased fatigue.  Discussed with her today.  Concern regarding some depression.  Discussed adjusting medications.  Discussed increasing paxil.  She reports she has taken an increased dose for short period and felt better.  No chest pain.  No sob.  No acid reflux.  No abdominal pain.  Bowels moving.     Past Medical History:  Diagnosis Date  . Anemia   . Arthritis   . Chicken pox   . Colitis   . Degenerative joint disease   . Diverticulitis   . Diverticulosis   . Fibrocystic breast disease   . GERD (gastroesophageal reflux disease)    EGD - gastritis/mild duodenitis, previous positive H. pylori  . Glaucoma   . Heart murmur   . Helicobacter pylori ab+   . History of depression   . History of skin cancer    facial  . Hypercholesterolemia   . Hypertension   . IBS (irritable bowel syndrome)   . Internal hemorrhoids   . Migraines   . Osteopenia   . Stroke ([place])   . Thyroid disease   . Tubular adenoma of colon   . Vitamin D deficiency    Past Surgical History:  Procedure Laterality Date  . ABDOMINAL HYSTERECTOMY  1973   secondary to bleeding  . APPENDECTOMY    . BILATERAL SALPINGOOPHORECTOMY  2004   complex ovarian cyst (left) - benign  . BREAST BIOPSY Bilateral    x4  . MANDIBLE FRACTURE SURGERY    . VENTRAL HERNIA REPAIR  2005   Dr. Tamala Julian   Family History  Problem Relation Age of Onset  . Esophageal cancer Mother   . Stomach cancer Mother   . Heart disease Father   . Cirrhosis Brother   . Parkinson's disease Brother     . Ovarian cancer Sister   . Colon cancer Sister        half-sister  . Migraines Daughter   . Heart attack Son    Social History   Socioeconomic History  . Marital status: Widowed    Spouse name: Not on file  . Number of children: 2  . Years of education: Not on file  . Highest education level: Not on file  Occupational History  . Occupation: retired  Scientific laboratory technician  . Financial resource strain: Not hard at all  . Food insecurity:    Worry: Never true    Inability: Never true  . Transportation needs:    Medical: No    Non-medical: No  Tobacco Use  . Smoking status: Never Smoker  . Smokeless tobacco: Never Used  Substance and Sexual Activity  . Alcohol use: No    Alcohol/week: 0.0 standard drinks  . Drug use: No  . Sexual activity: Never  Lifestyle  . Physical activity:    Days per week: Not on file    Minutes per session: Not on file  . Stress: Not at all  Relationships  . Social connections:    Talks  on phone: Patient refused    Gets together: Patient refused    Attends religious service: Patient refused    Active member of club or organization: Patient refused    Attends meetings of clubs or organizations: Patient refused    Relationship status: Patient refused  Other Topics Concern  . Not on file  Social History Narrative  . Not on file    Outpatient Encounter Medications as of 10/20/2017  Medication Sig  . aspirin 81 MG tablet Take 81 mg by mouth daily.  . clopidogrel (PLAVIX) 75 MG tablet Take 1 tablet (75 mg total) by mouth daily.  [name] ezetimibe (ZETIA) 10 MG tablet Take 1 tablet (10 mg total) by mouth daily.  [name] gabapentin (NEURONTIN) 100 MG capsule Take 1 capsule (100 mg total) by mouth 2 (two) times daily as needed.  [name] levothyroxine (SYNTHROID, LEVOTHROID) 88 MCG tablet Take 75 mcg by mouth daily.   [name] losartan (COZAAR) 50 MG tablet TAKE 1 TABLET EVERY DAY  . PARoxetine (PAXIL) 40 MG tablet Take 1 tablet (40 mg total) by mouth daily.  . pravastatin  (PRAVACHOL) 20 MG tablet Take 1 tablet (20 mg total) by mouth daily.  . rosuvastatin (CRESTOR) 40 MG tablet TAKE 1 TABLET BY MOUTH DAILY  . temazepam (RESTORIL) 15 MG capsule TAKE 1 CAPSULE AT BEDTIME  . [DISCONTINUED] PARoxetine (PAXIL) 30 MG tablet TAKE 1 TABLET EVERY DAY  . [DISCONTINUED] temazepam (RESTORIL) 15 MG capsule TAKE 1 CAPSULE AT BEDTIME  . [EXPIRED] cyanocobalamin ((VITAMIN B-12)) injection 1,000 mcg    No facility-administered encounter medications on file as of 10/20/2017.     Review of Systems  Constitutional: Positive for fatigue. Negative for appetite change and unexpected weight change.  HENT: Negative for congestion and sinus pressure.   Respiratory: Negative for cough, chest tightness and shortness of breath.   Cardiovascular: Negative for chest pain, palpitations and leg swelling.  Gastrointestinal: Negative for abdominal pain, diarrhea, nausea and vomiting.  Genitourinary: Negative for difficulty urinating and dysuria.  Musculoskeletal: Negative for joint swelling and myalgias.  Skin: Negative for color change and rash.  Neurological: Negative for dizziness, light-headedness and headaches.  Psychiatric/Behavioral: Negative for agitation and dysphoric mood.       Objective:    Physical Exam  Constitutional: She appears well-developed and well-nourished. No distress.  HENT:  Nose: Nose normal.  Mouth/Throat: Oropharynx is clear and moist.  Neck: Neck supple. No thyromegaly present.  Cardiovascular: Normal rate and regular rhythm.  Pulmonary/Chest: Breath sounds normal. No respiratory distress. She has no wheezes.  Abdominal: Soft. Bowel sounds are normal. There is no tenderness.  Musculoskeletal: She exhibits no edema or tenderness.  Lymphadenopathy:    She has no cervical adenopathy.  Skin: No rash noted. No erythema.  Psychiatric: She has a normal mood and affect. Thought content normal.    BP 138/78 (BP Location: Left Arm, Patient Position: Sitting,  Cuff Size: Normal)   Pulse 68   Temp 98 F (36.7 C) (Oral)   Resp 18   Wt 142 lb (64.4 kg)   LMP 01/17/1972   SpO2 98%   BMI 24.37 kg/m  Wt Readings from Last 3 Encounters:  10/20/17 142 lb (64.4 kg)  08/04/17 141 lb 4 oz (64.1 kg)  06/24/17 142 lb 12.8 oz (64.8 kg)     Lab Results  Component Value Date   WBC 4.7 09/15/2017   HGB 11.8 (L) 09/15/2017   HCT 35.7 (L) 09/15/2017   PLT 344.0 09/15/2017   GLUCOSE  92 09/15/2017   CHOL 257 (H) 09/15/2017   TRIG 84.0 09/15/2017   HDL 65.70 09/15/2017   LDLDIRECT 177.7 01/19/2013   LDLCALC 174 (H) 09/15/2017   ALT 8 09/15/2017   AST 13 09/15/2017   NA 139 09/15/2017   K 4.4 09/15/2017   CL 103 09/15/2017   CREATININE 0.82 09/15/2017   BUN 13 09/15/2017   CO2 28 09/15/2017   TSH 1.08 09/15/2017   HGBA1C 5.9 09/15/2017    Mm Screening Breast Tomo Bilateral  Result Date: 02/07/2017 CLINICAL DATA:  Screening. EXAM: 2D DIGITAL SCREENING BILATERAL MAMMOGRAM WITH 3D TOMO WITH CAD COMPARISON:  Previous exam(s). ACR Breast Density Category b: There are scattered areas of fibroglandular density. FINDINGS: There are no findings suspicious for malignancy. Images were processed with CAD. IMPRESSION: No mammographic evidence of malignancy. A result letter of this screening mammogram will be mailed directly to the patient. RECOMMENDATION: Screening mammogram in one year. (Code:SM-B-01Y) BI-RADS CATEGORY  1: Negative. Electronically Signed   By: Nolon Nations M.D.   On: 02/07/2017 14:11       Assessment & Plan:   Problem List Items Addressed This Visit    Aortic atherosclerosis ([place])    Had been on crestor.  Stopped.  Was started on pravastatin.  Did not tolerate.  Now on zetia.        CAD (coronary artery disease)    Currently asymptomatic.        Depression    Increased issues as outlined.  Could be contributing to increased fatigue.  Increase paxil to '40mg'$  q day.  Follow.        Relevant Medications   PARoxetine (PAXIL) 40  MG tablet   History of CVA (cerebrovascular accident)    Doing well on aspirin and plavix.  Follow.       Hypercholesterolemia    Intolerant to crestor.  Has tried multiple statin medications.  On zetia.  Follow lipid panel and liver function tests.        Hyperglycemia    Low carb diet and exercise.  Follow met b and a1c.       Hypertension    Blood pressure as outlined.  Continue same medications.  Follow.        Thyroid cancer ([place])    S/p total thyroidectomy - papillary carcinoma. - 08/2016.  On thyroid replacement with goal tsh .3 - 2.0.   Followed by Dr Gabriel Carina.         Other Visit Diagnoses    B12 deficiency    -  Primary   Relevant Medications   cyanocobalamin ((VITAMIN B-12)) injection 1,000 mcg (Completed)   Need for immunization against influenza           Einar Pheasant, MD

## 2017-10-26 ENCOUNTER — Encounter: Payer: Self-pay | Admitting: Internal Medicine

## 2017-10-26 NOTE — Assessment & Plan Note (Signed)
Doing well on aspirin and plavix.  Follow.   

## 2017-10-26 NOTE — Assessment & Plan Note (Signed)
S/p total thyroidectomy - papillary carcinoma. - 08/2016.  On thyroid replacement with goal tsh .3 - 2.0.   Followed by Dr Gabriel Carina.

## 2017-10-26 NOTE — Assessment & Plan Note (Signed)
Low carb diet and exercise.  Follow met b and a1c.  

## 2017-10-26 NOTE — Assessment & Plan Note (Signed)
Currently asymptomatic 

## 2017-10-26 NOTE — Assessment & Plan Note (Signed)
Intolerant to crestor.  Has tried multiple statin medications.  On zetia.  Follow lipid panel and liver function tests.

## 2017-10-26 NOTE — Assessment & Plan Note (Signed)
Had been on crestor.  Stopped.  Was started on pravastatin.  Did not tolerate.  Now on zetia.

## 2017-10-26 NOTE — Assessment & Plan Note (Signed)
Increased issues as outlined.  Could be contributing to increased fatigue.  Increase paxil to 40mg  q day.  Follow.

## 2017-10-26 NOTE — Assessment & Plan Note (Signed)
Blood pressure as outlined.  Continue same medications.  Follow.   

## 2017-11-20 ENCOUNTER — Ambulatory Visit: Payer: Medicare Other

## 2017-11-25 ENCOUNTER — Ambulatory Visit (INDEPENDENT_AMBULATORY_CARE_PROVIDER_SITE_OTHER): Payer: Medicare Other

## 2017-11-25 DIAGNOSIS — E538 Deficiency of other specified B group vitamins: Secondary | ICD-10-CM

## 2017-11-25 MED ORDER — CYANOCOBALAMIN 1000 MCG/ML IJ SOLN
1000.0000 ug | Freq: Once | INTRAMUSCULAR | Status: AC
  Administered 2017-11-25: 1000 ug via INTRAMUSCULAR

## 2017-11-25 MED ORDER — CYANOCOBALAMIN 1000 MCG/ML IJ SOLN: 1000.0000 ug | Freq: Once | INTRAMUSCULAR | Status: DC

## 2017-11-25 NOTE — Progress Notes (Signed)
Pt was here today for B-12 shot given in RD. Pt tolerated well.

## 2017-11-28 ENCOUNTER — Telehealth: Payer: Self-pay

## 2017-11-28 MED ORDER — CLOPIDOGREL BISULFATE 75 MG PO TABS: 75.0000 mg | ORAL_TABLET | Freq: Every day | ORAL | 3 refills | Status: DC

## 2017-11-28 NOTE — Telephone Encounter (Signed)
Refill sent for Plavix 75 mg to [facility] for 90 day supply.

## 2017-12-01 ENCOUNTER — Other Ambulatory Visit: Payer: Self-pay | Admitting: Internal Medicine

## 2017-12-04 ENCOUNTER — Other Ambulatory Visit: Payer: Self-pay

## 2017-12-04 ENCOUNTER — Telehealth: Payer: Self-pay | Admitting: Internal Medicine

## 2017-12-04 MED ORDER — PRAVASTATIN SODIUM 20 MG PO TABS: 20.0000 mg | ORAL_TABLET | Freq: Every day | ORAL | 1 refills | Status: DC

## 2017-12-04 MED ORDER — ROSUVASTATIN CALCIUM 40 MG PO TABS: 40.0000 mg | ORAL_TABLET | Freq: Every day | ORAL | 2 refills | Status: DC

## 2017-12-04 NOTE — Telephone Encounter (Signed)
I have sent a 90 day supply for both prescriptions to [facility].

## 2017-12-04 NOTE — Telephone Encounter (Unsigned)
Copied from [place] [phone]. Topic: Quick Communication - Rx Refill/Question >> Dec 04, 2017  8:29 AM Judyann Munson wrote: Medication: PARoxetine (PAXIL) 40 MG tablet    pravastatin (PRAVACHOL) 20 MG tablet   rosuvastatin (CRESTOR) 40 MG tablet   Pharmacy: requested a 90 day supply   Has the patient contacted their pharmacy? yes  Preferred Pharmacy (with phone number or street name): [place], [place] [phone] (Phone) [phone] (Fax)    Agent: Please be advised that RX refills may take up to 3 business days. We ask that you follow-up with your pharmacy.

## 2017-12-18 ENCOUNTER — Ambulatory Visit: Payer: Medicare Other | Admitting: Internal Medicine

## 2017-12-19 ENCOUNTER — Other Ambulatory Visit: Payer: Self-pay

## 2017-12-19 ENCOUNTER — Other Ambulatory Visit: Payer: Self-pay | Admitting: Internal Medicine

## 2017-12-19 MED ORDER — GABAPENTIN 100 MG PO CAPS: 100.0000 mg | ORAL_CAPSULE | Freq: Two times a day (BID) | ORAL | 1 refills | Status: DC | PRN

## 2017-12-19 MED ORDER — TEMAZEPAM 15 MG PO CAPS: ORAL_CAPSULE | ORAL | 1 refills | Status: DC

## 2017-12-19 NOTE — Progress Notes (Unsigned)
Pt dropped off list of refills. Patient says she cannot take Zetia. She started back on her rosuvastatin and pravastatin. I was not sure that she should be on both. There is not a note in the chart advising her to do so. Patient is not having any issues with the statin drugs. I sent in her refill for her gabapentin. She is requesting refill for her temazepam and also need clarification on which cholesterol medication you would like her to take.  Per review of chart.  She has had intolerance to multiple statin medications.  Per last note she was off statin medication and taking zetia and tolerating.  If she is currently taking crestor and doing fine, then continue crestor.  Stay off pravastatin and zetia.    Dr Nicki Reaper

## 2017-12-19 NOTE — Progress Notes (Signed)
Patient is aware and will do so. Med list updated

## 2017-12-29 ENCOUNTER — Ambulatory Visit (INDEPENDENT_AMBULATORY_CARE_PROVIDER_SITE_OTHER): Payer: Medicare Other | Admitting: Internal Medicine

## 2017-12-29 ENCOUNTER — Encounter: Payer: Self-pay | Admitting: Internal Medicine

## 2017-12-29 VITALS — BP 126/74 | HR 66 | Temp 98.3°F | Resp 16 | Wt 143.6 lb

## 2017-12-29 DIAGNOSIS — D649 Anemia, unspecified: Secondary | ICD-10-CM

## 2017-12-29 DIAGNOSIS — I700 Atherosclerosis of aorta: Secondary | ICD-10-CM | POA: Diagnosis not present

## 2017-12-29 DIAGNOSIS — Z1231 Encounter for screening mammogram for malignant neoplasm of breast: Secondary | ICD-10-CM | POA: Diagnosis not present

## 2017-12-29 DIAGNOSIS — F329 Major depressive disorder, single episode, unspecified: Secondary | ICD-10-CM

## 2017-12-29 DIAGNOSIS — Z8673 Personal history of transient ischemic attack (TIA), and cerebral infarction without residual deficits: Secondary | ICD-10-CM | POA: Diagnosis not present

## 2017-12-29 DIAGNOSIS — F32A Depression, unspecified: Secondary | ICD-10-CM

## 2017-12-29 DIAGNOSIS — R739 Hyperglycemia, unspecified: Secondary | ICD-10-CM | POA: Diagnosis not present

## 2017-12-29 DIAGNOSIS — I6523 Occlusion and stenosis of bilateral carotid arteries: Secondary | ICD-10-CM | POA: Diagnosis not present

## 2017-12-29 DIAGNOSIS — C73 Malignant neoplasm of thyroid gland: Secondary | ICD-10-CM

## 2017-12-29 DIAGNOSIS — E538 Deficiency of other specified B group vitamins: Secondary | ICD-10-CM | POA: Diagnosis not present

## 2017-12-29 DIAGNOSIS — I2510 Atherosclerotic heart disease of native coronary artery without angina pectoris: Secondary | ICD-10-CM | POA: Diagnosis not present

## 2017-12-29 DIAGNOSIS — I10 Essential (primary) hypertension: Secondary | ICD-10-CM

## 2017-12-29 DIAGNOSIS — E7800 Pure hypercholesterolemia, unspecified: Secondary | ICD-10-CM | POA: Diagnosis not present

## 2017-12-29 LAB — HEPATIC FUNCTION PANEL
ALT: 17 U/L (ref 0–35)
AST: 21 U/L (ref 0–37)
Albumin: 3.9 g/dL (ref 3.5–5.2)
Alkaline Phosphatase: 58 U/L (ref 39–117)
Bilirubin, Direct: 0 mg/dL (ref 0.0–0.3)
Total Bilirubin: 0.4 mg/dL (ref 0.2–1.2)
Total Protein: 6.6 g/dL (ref 6.0–8.3)

## 2017-12-29 LAB — BASIC METABOLIC PANEL
BUN: 12 mg/dL (ref 6–23)
CO2: 28 mEq/L (ref 19–32)
CREATININE: 0.79 mg/dL (ref 0.40–1.20)
Calcium: 9.6 mg/dL (ref 8.4–10.5)
Chloride: 102 mEq/L (ref 96–112)
GFR: 73.01 mL/min (ref >60.00)
Glucose, Bld: 96 mg/dL (ref 70–99)
Potassium: 4 mEq/L (ref 3.5–5.1)
Sodium: 138 mEq/L (ref 135–145)

## 2017-12-29 LAB — CBC WITH DIFFERENTIAL/PLATELET
Basophils Absolute: 0 10*3/uL (ref 0.0–0.1)
Basophils Relative: 0.6 % (ref 0.0–3.0)
Eosinophils Absolute: 0.1 10*3/uL (ref 0.0–0.7)
Eosinophils Relative: 1.8 % (ref 0.0–5.0)
HCT: 36.4 % (ref 36.0–46.0)
Hemoglobin: 12 g/dL (ref 12.0–15.0)
Lymphocytes Relative: 18.3 % (ref 12.0–46.0)
Lymphs Abs: 1 10*3/uL (ref 0.7–4.0)
MCHC: 33.1 g/dL (ref 30.0–36.0)
MCV: 85.9 fl (ref 78.0–100.0)
Monocytes Absolute: 0.5 10*3/uL (ref 0.1–1.0)
Monocytes Relative: 8.7 % (ref 3.0–12.0)
NEUTROS ABS: 4 10*3/uL (ref 1.4–7.7)
Neutrophils Relative %: 70.6 % (ref 43.0–77.0)
PLATELETS: 298 10*3/uL (ref 150.0–400.0)
RBC: 4.23 Mil/uL (ref 3.87–5.11)
RDW: 14.4 % (ref 11.5–15.5)
WBC: 5.6 10*3/uL (ref 4.0–10.5)

## 2017-12-29 LAB — FERRITIN: Ferritin: 19 ng/mL (ref 10.0–291.0)

## 2017-12-29 MED ORDER — PAROXETINE HCL 30 MG PO TABS: 30.0000 mg | ORAL_TABLET | Freq: Every day | ORAL | 1 refills | Status: DC

## 2017-12-29 MED ORDER — ROSUVASTATIN CALCIUM 40 MG PO TABS: 40.0000 mg | ORAL_TABLET | Freq: Every day | ORAL | 2 refills | Status: DC

## 2017-12-29 MED ORDER — CYANOCOBALAMIN 1000 MCG/ML IJ SOLN
1000.0000 ug | Freq: Once | INTRAMUSCULAR | Status: AC
  Administered 2017-12-29: 1000 ug via INTRAMUSCULAR

## 2017-12-29 NOTE — Assessment & Plan Note (Signed)
Doing well on aspirin and plavix.  Follow.   

## 2017-12-29 NOTE — Assessment & Plan Note (Signed)
S/p thyroidectomy - papillary carcinoma - 08/2016.  On thyroid replacement.  Goal TSH .3 - 2.0.  Followed by Dr Gabriel Carina.

## 2017-12-29 NOTE — Progress Notes (Signed)
Patient ID: Tamara [name], female   DOB: [birth year], 84 y.o.   MRN: [id number]   Subjective:    Patient ID: Tamara [name], female    DOB: Jun 27, 1927, 84 y.o.   MRN: [id number]  HPI  Patient here for a scheduled follow up.  She is s/p total thyroidectomy for papillary thyroid cancer 08/2016.  On levothyroxine.  Sees Dr Gabriel Carina.  Last evaluated 09/30/17.  Goal TSH .3- 2.0.  Recommended f/u in 6 months.  She reports she is doing better.  Feels better.  On paxil.  States she is only taking '30mg'$  of paxil now.  Did not increase to '40mg'$ .  Trying to stay active.  No chest pain.  No sob.  No acid reflux.  No abdominal pain.  Bowels moving.  Did not tolerate zetia.  On crestor now and tolerating.     Past Medical History:  Diagnosis Date  . Anemia   . Arthritis   . Chicken pox   . Colitis   . Degenerative joint disease   . Diverticulitis   . Diverticulosis   . Fibrocystic breast disease   . GERD (gastroesophageal reflux disease)    EGD - gastritis/mild duodenitis, previous positive H. pylori  . Glaucoma   . Heart murmur   . Helicobacter pylori ab+   . History of depression   . History of skin cancer    facial  . Hypercholesterolemia   . Hypertension   . IBS (irritable bowel syndrome)   . Internal hemorrhoids   . Migraines   . Osteopenia   . Stroke ([place])   . Thyroid disease   . Tubular adenoma of colon   . Vitamin D deficiency    Past Surgical History:  Procedure Laterality Date  . ABDOMINAL HYSTERECTOMY  1973   secondary to bleeding  . APPENDECTOMY    . BILATERAL SALPINGOOPHORECTOMY  2004   complex ovarian cyst (left) - benign  . BREAST BIOPSY Bilateral    x4  . MANDIBLE FRACTURE SURGERY    . VENTRAL HERNIA REPAIR  2005   Dr. Tamala Julian   Family History  Problem Relation Age of Onset  . Esophageal cancer Mother   . Stomach cancer Mother   . Heart disease Father   . Cirrhosis Brother   . Parkinson's disease Brother   . Ovarian cancer Sister   . Colon cancer Sister    half-sister  . Migraines Daughter   . Heart attack Son    Social History   Socioeconomic History  . Marital status: Widowed    Spouse name: Not on file  . Number of children: 2  . Years of education: Not on file  . Highest education level: Not on file  Occupational History  . Occupation: retired  Scientific laboratory technician  . Financial resource strain: Not hard at all  . Food insecurity:    Worry: Never true    Inability: Never true  . Transportation needs:    Medical: No    Non-medical: No  Tobacco Use  . Smoking status: Never Smoker  . Smokeless tobacco: Never Used  Substance and Sexual Activity  . Alcohol use: No    Alcohol/week: 0.0 standard drinks  . Drug use: No  . Sexual activity: Never  Lifestyle  . Physical activity:    Days per week: Not on file    Minutes per session: Not on file  . Stress: Not at all  Relationships  . Social connections:    Talks on phone: Patient refused  Gets together: Patient refused    Attends religious service: Patient refused    Active member of club or organization: Patient refused    Attends meetings of clubs or organizations: Patient refused    Relationship status: Patient refused  Other Topics Concern  . Not on file  Social History Narrative  . Not on file    Outpatient Encounter Medications as of 12/29/2017  Medication Sig  . aspirin 81 MG tablet Take 81 mg by mouth daily.  . clopidogrel (PLAVIX) 75 MG tablet Take 1 tablet (75 mg total) by mouth daily.  [name] gabapentin (NEURONTIN) 100 MG capsule Take 1 capsule (100 mg total) by mouth 2 (two) times daily as needed.  [name] levothyroxine (SYNTHROID, LEVOTHROID) 75 MCG tablet Take 75 mcg by mouth daily.  [name] losartan (COZAAR) 50 MG tablet TAKE 1 TABLET EVERY DAY  . PARoxetine (PAXIL) 30 MG tablet Take 1 tablet (30 mg total) by mouth daily.  . rosuvastatin (CRESTOR) 40 MG tablet Take 1 tablet (40 mg total) by mouth daily.  . temazepam (RESTORIL) 15 MG capsule TAKE 1 CAPSULE AT BEDTIME  .  [DISCONTINUED] gabapentin (NEURONTIN) 100 MG capsule TAKE 1 TABLET BY MOUTH TWICE DAILY AS NEEDED  . [DISCONTINUED] levothyroxine (SYNTHROID, LEVOTHROID) 88 MCG tablet Take 75 mcg by mouth daily.   . [DISCONTINUED] PARoxetine (PAXIL) 40 MG tablet Take 1 tablet (40 mg total) by mouth daily.  . [DISCONTINUED] rosuvastatin (CRESTOR) 40 MG tablet Take 1 tablet (40 mg total) by mouth daily.  . [DISCONTINUED] temazepam (RESTORIL) 15 MG capsule TAKE 1 CAPSULE BY MOUTH AT BEDTIME  . [EXPIRED] cyanocobalamin ((VITAMIN B-12)) injection 1,000 mcg    No facility-administered encounter medications on file as of 12/29/2017.     Review of Systems  Constitutional: Negative for appetite change and unexpected weight change.  HENT: Negative for congestion and sinus pressure.   Respiratory: Negative for cough, chest tightness and shortness of breath.   Cardiovascular: Negative for chest pain, palpitations and leg swelling.  Gastrointestinal: Negative for abdominal pain, diarrhea, nausea and vomiting.  Genitourinary: Negative for difficulty urinating and dysuria.  Musculoskeletal: Negative for joint swelling and myalgias.  Skin: Negative for color change and rash.  Neurological: Negative for dizziness, light-headedness and headaches.  Psychiatric/Behavioral: Negative for agitation and dysphoric mood.       Objective:    Physical Exam  Constitutional: She appears well-developed and well-nourished. No distress.  HENT:  Nose: Nose normal.  Mouth/Throat: Oropharynx is clear and moist.  Neck: Neck supple. No thyromegaly present.  Cardiovascular: Normal rate and regular rhythm.  Pulmonary/Chest: Breath sounds normal. No respiratory distress. She has no wheezes.  Abdominal: Soft. Bowel sounds are normal. There is no tenderness.  Musculoskeletal: She exhibits no edema or tenderness.  Lymphadenopathy:    She has no cervical adenopathy.  Skin: No rash noted. No erythema.  Psychiatric: She has a normal mood  and affect. Her behavior is normal.    BP 126/74 (BP Location: Left Arm, Patient Position: Sitting, Cuff Size: Normal)   Pulse 66   Temp 98.3 F (36.8 C) (Oral)   Resp 16   Wt 143 lb 9.6 oz (65.1 kg)   LMP 01/17/1972   SpO2 98%   BMI 24.65 kg/m  Wt Readings from Last 3 Encounters:  12/29/17 143 lb 9.6 oz (65.1 kg)  10/20/17 142 lb (64.4 kg)  08/04/17 141 lb 4 oz (64.1 kg)     Lab Results  Component Value Date   WBC 5.6 12/29/2017  HGB 12.0 12/29/2017   HCT 36.4 12/29/2017   PLT 298.0 12/29/2017   GLUCOSE 96 12/29/2017   CHOL 257 (H) 09/15/2017   TRIG 84.0 09/15/2017   HDL 65.70 09/15/2017   LDLDIRECT 177.7 01/19/2013   LDLCALC 174 (H) 09/15/2017   ALT 17 12/29/2017   AST 21 12/29/2017   NA 138 12/29/2017   K 4.0 12/29/2017   CL 102 12/29/2017   CREATININE 0.79 12/29/2017   BUN 12 12/29/2017   CO2 28 12/29/2017   TSH 1.08 09/15/2017   HGBA1C 5.9 09/15/2017    Mm Screening Breast Tomo Bilateral  Result Date: 02/07/2017 CLINICAL DATA:  Screening. EXAM: 2D DIGITAL SCREENING BILATERAL MAMMOGRAM WITH 3D TOMO WITH CAD COMPARISON:  Previous exam(s). ACR Breast Density Category b: There are scattered areas of fibroglandular density. FINDINGS: There are no findings suspicious for malignancy. Images were processed with CAD. IMPRESSION: No mammographic evidence of malignancy. A result letter of this screening mammogram will be mailed directly to the patient. RECOMMENDATION: Screening mammogram in one year. (Code:SM-B-01Y) BI-RADS CATEGORY  1: Negative. Electronically Signed   By: Nolon Nations M.D.   On: 02/07/2017 14:11       Assessment & Plan:   Problem List Items Addressed This Visit    Aortic atherosclerosis ([place])    On crestor.        Relevant Medications   rosuvastatin (CRESTOR) 40 MG tablet   CAD (coronary artery disease)    Currently asymptomatic.        Relevant Medications   rosuvastatin (CRESTOR) 40 MG tablet   Depression    On paxil.  Feels doing  well on paxil '30mg'$  q day.  Follow.        Relevant Medications   PARoxetine (PAXIL) 30 MG tablet   History of CVA (cerebrovascular accident)    Doing well on aspirin and plavix.  Follow.        Hypercholesterolemia    On crestor. Did not tolerate zetia.  Low cholesterol diet and exercise.  Follow lipid panel and liver function tests.        Relevant Medications   rosuvastatin (CRESTOR) 40 MG tablet   Other Relevant Orders   Hepatic function panel (Completed)   Hepatic function panel   Lipid panel   Hyperglycemia    Low carb diet and exercise.  Follow met b and a1c        Relevant Orders   Hemoglobin A1c   Hypertension    Blood pressure under good control.  Continue same medication regimen.  Follow pressures.  Follow metabolic panel.  Blood pressure on my recheck:  128/64.        Relevant Medications   rosuvastatin (CRESTOR) 40 MG tablet   Other Relevant Orders   Basic metabolic panel (Completed)   Basic metabolic panel   Thyroid cancer ([place])    S/p thyroidectomy - papillary carcinoma - 08/2016.  On thyroid replacement.  Goal TSH .3 - 2.0.  Followed by Dr Gabriel Carina.        Relevant Medications   levothyroxine (SYNTHROID, LEVOTHROID) 75 MCG tablet    Other Visit Diagnoses    Visit for screening mammogram    -  Primary   Relevant Orders   MM 3D SCREEN BREAST BILATERAL   Anemia, unspecified type       Relevant Medications   cyanocobalamin ((VITAMIN B-12)) injection 1,000 mcg (Completed)   Other Relevant Orders   CBC with Differential/Platelet (Completed)   Ferritin (Completed)   B12 deficiency  Relevant Medications   cyanocobalamin ((VITAMIN B-12)) injection 1,000 mcg (Completed)       Einar Pheasant, MD

## 2017-12-29 NOTE — Assessment & Plan Note (Signed)
Low carb diet and exercise.  Follow met b and a1c.   

## 2017-12-29 NOTE — Assessment & Plan Note (Signed)
On crestor.   

## 2017-12-29 NOTE — Assessment & Plan Note (Signed)
On paxil.  Feels doing well on paxil 30mg  q day.  Follow.

## 2017-12-29 NOTE — Assessment & Plan Note (Signed)
On crestor. Did not tolerate zetia.  Low cholesterol diet and exercise.  Follow lipid panel and liver function tests.

## 2017-12-29 NOTE — Assessment & Plan Note (Signed)
Blood pressure under good control.  Continue same medication regimen.  Follow pressures.  Follow metabolic panel.  Blood pressure on my recheck:  128/64.

## 2017-12-29 NOTE — Assessment & Plan Note (Signed)
Currently asymptomatic 

## 2017-12-30 ENCOUNTER — Telehealth: Payer: Self-pay | Admitting: Internal Medicine

## 2017-12-30 NOTE — Telephone Encounter (Signed)
Copied from [place] [phone]. Topic: Quick Communication - Lab Results (Clinic Use ONLY) >> Dec 30, 2017  1:25 PM Lars Masson, LPN wrote: Called patient to inform them of lab results. When patient returns call, triage nurse may disclose results.   pt returning call foe lab results

## 2017-12-30 NOTE — Telephone Encounter (Signed)
Result information provided to the patient: Notes recorded by Einar Pheasant, MD on 12/29/2017 at 7:34 PM EST Notify pt that her hgb is wnl. Kidney function tests and liver function tests are wnl. /

## 2018-01-02 ENCOUNTER — Ambulatory Visit: Payer: Medicare Other

## 2018-01-19 ENCOUNTER — Ambulatory Visit: Payer: Self-pay | Admitting: Hematology

## 2018-01-19 NOTE — Telephone Encounter (Signed)
Please confirm with pt if she desires name brand or is generic ok.

## 2018-01-19 NOTE — Telephone Encounter (Signed)
I spoke with patient's daughter as well as [facility]. Patient should be on generic brand.

## 2018-01-19 NOTE — Telephone Encounter (Signed)
Pharmacy calling wanting to know if pt should go back on generic in stead of brand name on the temazepam (RESTORIL) 15 MG capsule They didn't know if she changed it to brand instead of generic / Please see request from [facility]

## 2018-01-19 NOTE — Telephone Encounter (Signed)
Please advise 

## 2018-01-23 ENCOUNTER — Other Ambulatory Visit: Payer: Self-pay | Admitting: Internal Medicine

## 2018-01-26 ENCOUNTER — Ambulatory Visit: Payer: Self-pay | Admitting: *Deleted

## 2018-01-26 DIAGNOSIS — R102 Pelvic and perineal pain: Secondary | ICD-10-CM | POA: Diagnosis not present

## 2018-01-26 DIAGNOSIS — Z8673 Personal history of transient ischemic attack (TIA), and cerebral infarction without residual deficits: Secondary | ICD-10-CM | POA: Diagnosis not present

## 2018-01-26 DIAGNOSIS — Z85850 Personal history of malignant neoplasm of thyroid: Secondary | ICD-10-CM | POA: Diagnosis not present

## 2018-01-26 DIAGNOSIS — E785 Hyperlipidemia, unspecified: Secondary | ICD-10-CM | POA: Diagnosis not present

## 2018-01-26 DIAGNOSIS — I2510 Atherosclerotic heart disease of native coronary artery without angina pectoris: Secondary | ICD-10-CM | POA: Diagnosis not present

## 2018-01-26 DIAGNOSIS — Z885 Allergy status to narcotic agent status: Secondary | ICD-10-CM | POA: Diagnosis not present

## 2018-01-26 DIAGNOSIS — N9089 Other specified noninflammatory disorders of vulva and perineum: Secondary | ICD-10-CM | POA: Diagnosis not present

## 2018-01-26 DIAGNOSIS — Z886 Allergy status to analgesic agent status: Secondary | ICD-10-CM | POA: Diagnosis not present

## 2018-01-26 DIAGNOSIS — Z800 Family history of malignant neoplasm of digestive organs: Secondary | ICD-10-CM | POA: Diagnosis not present

## 2018-01-26 DIAGNOSIS — Z7982 Long term (current) use of aspirin: Secondary | ICD-10-CM | POA: Diagnosis not present

## 2018-01-26 DIAGNOSIS — K589 Irritable bowel syndrome without diarrhea: Secondary | ICD-10-CM | POA: Diagnosis not present

## 2018-01-26 DIAGNOSIS — I10 Essential (primary) hypertension: Secondary | ICD-10-CM | POA: Diagnosis not present

## 2018-01-26 NOTE — Telephone Encounter (Signed)
Patient calls for lower right-sided abdominal pain that comes and goes for over one month. Along with it, she has began to experience vaginal swelling with pressure and when she sits down she experiences increased pressure and itching in the vaginal area. At times, after voiding she feels increased vaginal pressure. Denies fever/blood or discharge with voiding/BM/CP/SOB. LBM today was normal. Stated, at times she has burning going down her legs from the vaginal area, while sitting. Thinks possibly its bladder/uterus protrusion.Recommended ER visit. Spoke with [employer], patient's daughter. She will take patient to ER at this time.  Reason for Disposition . [1] MODERATE pain (e.g., interferes with normal activities) AND [2] pain comes and goes (cramps) AND [3] present > 24 hours  (Exception: pain with Vomiting or Diarrhea - see that Guideline)  Answer Assessment - Initial Assessment Questions 1. LOCATION: "Where does it hurt?"      Lower abdominal pain and pressure on right side. Vagina feels swollen. 2. RADIATION: "Does the pain shoot anywhere else?" (e.g., chest, back)     no 3. ONSET: "When did the pain begin?" (e.g., minutes, hours or days ago)      2 months ago 4. SUDDEN: "Gradual or sudden onset?"     Gradual over months 5. PATTERN "Does the pain come and go, or is it constant?"    - If constant: "Is it getting better, staying the same, or worsening?"      (Note: Constant means the pain never goes away completely; most serious pain is constant and it progresses)     - If intermittent: "How long does it last?" "Do you have pain now?"     (Note: Intermittent means the pain goes away completely between bouts)     Comes and goes. 6. SEVERITY: "How bad is the pain?"  (e.g., Scale 1-10; mild, moderate, or severe)   - MILD (1-3): doesn't interfere with normal activities, abdomen soft and not tender to touch    - MODERATE (4-7): interferes with normal activities or awakens from sleep, tender to touch     - SEVERE (8-10): excruciating pain, doubled over, unable to do any normal activities      moderate 7. RECURRENT SYMPTOM: "Have you ever had this type of abdominal pain before?" If so, ask: "When was the last time?" and "What happened that time?"      Abdominal pain comes and goes. 8. CAUSE: "What do you think is causing the abdominal pain?"     She thinks her uterus has fallen 9. RELIEVING/AGGRAVATING FACTORS: "What makes it better or worse?" (e.g., movement, antacids, bowel movement)    nothing 10. OTHER SYMPTOMS: "Has there been any vomiting, diarrhea, constipation, or urine problems?"       At times pressure continues after voiding. 11. PREGNANCY: "Is there any chance you are pregnant?" "When was your last menstrual period?"       na  Protocols used: ABDOMINAL PAIN - [facility]

## 2018-02-02 DIAGNOSIS — N949 Unspecified condition associated with female genital organs and menstrual cycle: Secondary | ICD-10-CM | POA: Diagnosis not present

## 2018-02-02 DIAGNOSIS — N9089 Other specified noninflammatory disorders of vulva and perineum: Secondary | ICD-10-CM | POA: Diagnosis not present

## 2018-02-10 ENCOUNTER — Ambulatory Visit
Admission: RE | Admit: 2018-02-10 | Discharge: 2018-02-10 | Disposition: A | Discharge status: Home / Self Care | Payer: Medicare Other | Source: Ambulatory Visit | Attending: Internal Medicine | Admitting: Internal Medicine

## 2018-02-10 DIAGNOSIS — Z1231 Encounter for screening mammogram for malignant neoplasm of breast: Secondary | ICD-10-CM | POA: Insufficient documentation

## 2018-02-18 ENCOUNTER — Other Ambulatory Visit (HOSPITAL_COMMUNITY): Payer: Self-pay | Admitting: Neurology

## 2018-02-18 ENCOUNTER — Other Ambulatory Visit: Payer: Self-pay | Admitting: Neurology

## 2018-02-18 DIAGNOSIS — R102 Pelvic and perineal pain: Secondary | ICD-10-CM

## 2018-02-18 DIAGNOSIS — M79604 Pain in right leg: Secondary | ICD-10-CM

## 2018-02-18 DIAGNOSIS — E538 Deficiency of other specified B group vitamins: Secondary | ICD-10-CM | POA: Diagnosis not present

## 2018-02-18 DIAGNOSIS — M79605 Pain in left leg: Principal | ICD-10-CM

## 2018-02-18 DIAGNOSIS — E559 Vitamin D deficiency, unspecified: Secondary | ICD-10-CM | POA: Diagnosis not present

## 2018-02-24 ENCOUNTER — Ambulatory Visit
Admission: RE | Admit: 2018-02-24 | Discharge: 2018-02-24 | Disposition: A | Discharge status: Home / Self Care | Payer: Medicare Other | Source: Ambulatory Visit | Attending: Neurology | Admitting: Neurology

## 2018-02-24 DIAGNOSIS — R102 Pelvic and perineal pain: Secondary | ICD-10-CM | POA: Insufficient documentation

## 2018-02-24 DIAGNOSIS — M79604 Pain in right leg: Secondary | ICD-10-CM | POA: Diagnosis not present

## 2018-02-24 DIAGNOSIS — M545 Low back pain: Secondary | ICD-10-CM | POA: Diagnosis not present

## 2018-02-24 DIAGNOSIS — M79605 Pain in left leg: Secondary | ICD-10-CM | POA: Insufficient documentation

## 2018-03-26 DIAGNOSIS — E559 Vitamin D deficiency, unspecified: Secondary | ICD-10-CM | POA: Diagnosis not present

## 2018-03-26 DIAGNOSIS — M79604 Pain in right leg: Secondary | ICD-10-CM | POA: Diagnosis not present

## 2018-03-26 DIAGNOSIS — E538 Deficiency of other specified B group vitamins: Secondary | ICD-10-CM | POA: Diagnosis not present

## 2018-03-26 DIAGNOSIS — M79605 Pain in left leg: Secondary | ICD-10-CM | POA: Diagnosis not present

## 2018-03-26 DIAGNOSIS — I6930 Unspecified sequelae of cerebral infarction: Secondary | ICD-10-CM | POA: Diagnosis not present

## 2018-03-26 DIAGNOSIS — M25561 Pain in right knee: Secondary | ICD-10-CM | POA: Diagnosis not present

## 2018-03-26 DIAGNOSIS — F5104 Psychophysiologic insomnia: Secondary | ICD-10-CM | POA: Diagnosis not present

## 2018-03-27 DIAGNOSIS — Z85850 Personal history of malignant neoplasm of thyroid: Secondary | ICD-10-CM | POA: Diagnosis not present

## 2018-03-27 DIAGNOSIS — E890 Postprocedural hypothyroidism: Secondary | ICD-10-CM | POA: Diagnosis not present

## 2018-04-03 DIAGNOSIS — Z85850 Personal history of malignant neoplasm of thyroid: Secondary | ICD-10-CM | POA: Diagnosis not present

## 2018-04-03 DIAGNOSIS — E890 Postprocedural hypothyroidism: Secondary | ICD-10-CM | POA: Diagnosis not present

## 2018-04-10 ENCOUNTER — Other Ambulatory Visit: Payer: Medicare Other

## 2018-04-13 ENCOUNTER — Other Ambulatory Visit: Payer: Self-pay

## 2018-04-13 ENCOUNTER — Other Ambulatory Visit (INDEPENDENT_AMBULATORY_CARE_PROVIDER_SITE_OTHER): Payer: Medicare Other

## 2018-04-13 DIAGNOSIS — R739 Hyperglycemia, unspecified: Secondary | ICD-10-CM

## 2018-04-13 DIAGNOSIS — E7800 Pure hypercholesterolemia, unspecified: Secondary | ICD-10-CM

## 2018-04-13 DIAGNOSIS — I10 Essential (primary) hypertension: Secondary | ICD-10-CM

## 2018-04-13 LAB — LIPID PANEL
Cholesterol: 190 mg/dL (ref 0–200)
HDL: 74 mg/dL (ref >39.00)
LDL Cholesterol: 98 mg/dL (ref 0–99)
NonHDL: 115.59
Total CHOL/HDL Ratio: 3
Triglycerides: 90 mg/dL (ref 0.0–149.0)
VLDL: 18 mg/dL (ref 0.0–40.0)

## 2018-04-13 LAB — HEPATIC FUNCTION PANEL
ALT: 8 U/L (ref 0–35)
AST: 16 U/L (ref 0–37)
Albumin: 4.2 g/dL (ref 3.5–5.2)
Alkaline Phosphatase: 66 U/L (ref 39–117)
Bilirubin, Direct: 0.1 mg/dL (ref 0.0–0.3)
Total Bilirubin: 0.4 mg/dL (ref 0.2–1.2)
Total Protein: 7.1 g/dL (ref 6.0–8.3)

## 2018-04-13 LAB — BASIC METABOLIC PANEL
BUN: 16 mg/dL (ref 6–23)
CALCIUM: 9.2 mg/dL (ref 8.4–10.5)
CO2: 28 mEq/L (ref 19–32)
Chloride: 103 mEq/L (ref 96–112)
Creatinine, Ser: 0.72 mg/dL (ref 0.40–1.20)
GFR: 76.41 mL/min (ref >60.00)
Glucose, Bld: 99 mg/dL (ref 70–99)
Potassium: 4.1 mEq/L (ref 3.5–5.1)
Sodium: 139 mEq/L (ref 135–145)

## 2018-04-13 LAB — HEMOGLOBIN A1C: Hgb A1c MFr Bld: 6 % (ref 4.6–6.5)

## 2018-04-16 ENCOUNTER — Ambulatory Visit (INDEPENDENT_AMBULATORY_CARE_PROVIDER_SITE_OTHER): Payer: Medicare Other | Admitting: Internal Medicine

## 2018-04-16 ENCOUNTER — Other Ambulatory Visit: Payer: Self-pay

## 2018-04-16 ENCOUNTER — Encounter: Payer: Self-pay | Admitting: Internal Medicine

## 2018-04-16 DIAGNOSIS — R739 Hyperglycemia, unspecified: Secondary | ICD-10-CM | POA: Diagnosis not present

## 2018-04-16 DIAGNOSIS — Z8673 Personal history of transient ischemic attack (TIA), and cerebral infarction without residual deficits: Secondary | ICD-10-CM | POA: Diagnosis not present

## 2018-04-16 DIAGNOSIS — N9089 Other specified noninflammatory disorders of vulva and perineum: Secondary | ICD-10-CM

## 2018-04-16 DIAGNOSIS — Z0000 Encounter for general adult medical examination without abnormal findings: Secondary | ICD-10-CM

## 2018-04-16 DIAGNOSIS — C73 Malignant neoplasm of thyroid gland: Secondary | ICD-10-CM

## 2018-04-16 DIAGNOSIS — F32A Depression, unspecified: Secondary | ICD-10-CM

## 2018-04-16 DIAGNOSIS — I700 Atherosclerosis of aorta: Secondary | ICD-10-CM

## 2018-04-16 DIAGNOSIS — I10 Essential (primary) hypertension: Secondary | ICD-10-CM

## 2018-04-16 DIAGNOSIS — F329 Major depressive disorder, single episode, unspecified: Secondary | ICD-10-CM

## 2018-04-16 DIAGNOSIS — M79606 Pain in leg, unspecified: Secondary | ICD-10-CM

## 2018-04-16 DIAGNOSIS — E7800 Pure hypercholesterolemia, unspecified: Secondary | ICD-10-CM

## 2018-04-16 NOTE — Progress Notes (Signed)
Patient ID: Tamara [name], female   DOB: 1927/03/17, 85 y.o.   MRN: [id number]   Subjective:    Patient ID: Tamara [name], female    DOB: 08/03/1927, 85 y.o.   MRN: [id number]  HPI  Patient with past history of hypertension, hypercholesterolemia and anemia.  She comes in today to follow up on these issues as well as for a complete physical exam. Is s/p total thyroidectomy for papillary thyroid cancer on 09/16/16.  She reports she is doing relatively well.  Tries to stay active.  No chest pain.  No sob.  No acid reflux.  No abdominal pain.  Bowels moving.  No urine change.  left side pain better.  Taking gabapentin. Helping.  She did fall and states her boot hit her vagina.  Reports saw gyn - for lesion of labia.  S/p biopsy.  Treated for G. Vag.  Still with some soreness to palpation.  Discussed f/u with gyn.     Past Medical History:  Diagnosis Date  . Anemia   . Arthritis   . Chicken pox   . Colitis   . Degenerative joint disease   . Diverticulitis   . Diverticulosis   . Fibrocystic breast disease   . GERD (gastroesophageal reflux disease)    EGD - gastritis/mild duodenitis, previous positive H. pylori  . Glaucoma   . Heart murmur   . Helicobacter pylori ab+   . History of depression   . History of skin cancer    facial  . Hypercholesterolemia   . Hypertension   . IBS (irritable bowel syndrome)   . Internal hemorrhoids   . Migraines   . Osteopenia   . Stroke ([place])   . Thyroid disease   . Tubular adenoma of colon   . Vitamin D deficiency    Past Surgical History:  Procedure Laterality Date  . ABDOMINAL HYSTERECTOMY  1973   secondary to bleeding  . APPENDECTOMY    . BILATERAL SALPINGOOPHORECTOMY  2004   complex ovarian cyst (left) - benign  . BREAST BIOPSY Bilateral    x4- neg  . MANDIBLE FRACTURE SURGERY    . VENTRAL HERNIA REPAIR  2005   Dr. Tamala Julian   Family History  Problem Relation Age of Onset  . Esophageal cancer Mother   . Stomach cancer Mother   . Heart  disease Father   . Cirrhosis Brother   . Parkinson's disease Brother   . Ovarian cancer Sister   . Colon cancer Sister        half-sister  . Migraines Daughter   . Heart attack Son   . Breast cancer Neg Hx    Social History   Socioeconomic History  . Marital status: Widowed    Spouse name: Not on file  . Number of children: 2  . Years of education: Not on file  . Highest education level: Not on file  Occupational History  . Occupation: retired  Scientific laboratory technician  . Financial resource strain: Not hard at all  . Food insecurity:    Worry: Never true    Inability: Never true  . Transportation needs:    Medical: No    Non-medical: No  Tobacco Use  . Smoking status: Never Smoker  . Smokeless tobacco: Never Used  Substance and Sexual Activity  . Alcohol use: No    Alcohol/week: 0.0 standard drinks  . Drug use: No  . Sexual activity: Never  Lifestyle  . Physical activity:    Days per week:  Not on file    Minutes per session: Not on file  . Stress: Not at all  Relationships  . Social connections:    Talks on phone: Patient refused    Gets together: Patient refused    Attends religious service: Patient refused    Active member of club or organization: Patient refused    Attends meetings of clubs or organizations: Patient refused    Relationship status: Patient refused  Other Topics Concern  . Not on file  Social History Narrative  . Not on file    Outpatient Encounter Medications as of 04/16/2018  Medication Sig  . aspirin 81 MG tablet Take 81 mg by mouth daily.  . clopidogrel (PLAVIX) 75 MG tablet Take 1 tablet (75 mg total) by mouth daily.  . DULoxetine (CYMBALTA) 20 MG capsule Take 20 mg by mouth daily as needed.  . gabapentin (NEURONTIN) 300 MG capsule Take 300 mg by mouth 2 (two) times daily as needed.  [name] levothyroxine (SYNTHROID, LEVOTHROID) 75 MCG tablet Take 75 mcg by mouth daily.  [name] losartan (COZAAR) 50 MG tablet TAKE 1 TABLET EVERY DAY  . PARoxetine (PAXIL)  30 MG tablet Take 1 tablet (30 mg total) by mouth daily.  . rosuvastatin (CRESTOR) 40 MG tablet Take 1 tablet (40 mg total) by mouth daily.  . temazepam (RESTORIL) 15 MG capsule TAKE 1 CAPSULE AT BEDTIME  . [DISCONTINUED] gabapentin (NEURONTIN) 100 MG capsule Take 1 capsule (100 mg total) by mouth 2 (two) times daily as needed.   No facility-administered encounter medications on file as of 04/16/2018.     Review of Systems  Constitutional: Negative for appetite change and unexpected weight change.  HENT: Negative for congestion and sinus pressure.   Eyes: Negative for pain and visual disturbance.  Respiratory: Negative for cough, chest tightness and shortness of breath.   Cardiovascular: Negative for chest pain, palpitations and leg swelling.  Gastrointestinal: Negative for abdominal pain, diarrhea, nausea and vomiting.  Genitourinary: Negative for difficulty urinating and dysuria.       Vaginal pain with palpation as outlined.    Musculoskeletal: Negative for joint swelling and myalgias.  Skin: Negative for color change and rash.  Neurological: Negative for dizziness, light-headedness and headaches.  Hematological: Negative for adenopathy. Does not bruise/bleed easily.  Psychiatric/Behavioral: Negative for agitation and dysphoric mood.       Objective:    Physical Exam Constitutional:      General: She is not in acute distress.    Appearance: Normal appearance. She is well-developed.  HENT:     Nose: Nose normal. No congestion.     Mouth/Throat:     Pharynx: No oropharyngeal exudate or posterior oropharyngeal erythema.  Eyes:     General: No scleral icterus.       Right eye: No discharge.        Left eye: No discharge.  Neck:     Musculoskeletal: Neck supple. No muscular tenderness.     Thyroid: No thyromegaly.  Cardiovascular:     Rate and Rhythm: Normal rate and regular rhythm.  Pulmonary:     Effort: No tachypnea, accessory muscle usage or respiratory distress.      Breath sounds: Normal breath sounds. No decreased breath sounds or wheezing.  Chest:     Breasts:        Right: No inverted nipple, mass, nipple discharge or tenderness (no axillary adenopathy).        Left: No inverted nipple, mass, nipple discharge or tenderness (no  axilarry adenopathy).  Abdominal:     General: Bowel sounds are normal.     Palpations: Abdomen is soft.     Tenderness: There is no abdominal tenderness.  Genitourinary:    Comments: GU:  Persistent lesion/discoloration - labia.  Increased tenderness to palpation.   Musculoskeletal:        General: No swelling or tenderness.  Lymphadenopathy:     Cervical: No cervical adenopathy.  Skin:    Findings: No erythema or rash.  Neurological:     Mental Status: She is alert and oriented to person, place, and time.  Psychiatric:        Mood and Affect: Mood normal.        Behavior: Behavior normal.     BP (!) 118/58   Pulse 69   Temp 97.7 F (36.5 C) (Oral)   Resp 16   Ht '5\' 4"'$  (1.626 m)   Wt 142 lb 12.8 oz (64.8 kg)   LMP 01/17/1972   SpO2 98%   BMI 24.51 kg/m  Wt Readings from Last 3 Encounters:  04/16/18 142 lb 12.8 oz (64.8 kg)  12/29/17 143 lb 9.6 oz (65.1 kg)  10/20/17 142 lb (64.4 kg)     Lab Results  Component Value Date   WBC 5.6 12/29/2017   HGB 12.0 12/29/2017   HCT 36.4 12/29/2017   PLT 298.0 12/29/2017   GLUCOSE 99 04/13/2018   CHOL 190 04/13/2018   TRIG 90.0 04/13/2018   HDL 74.00 04/13/2018   LDLDIRECT 177.7 01/19/2013   LDLCALC 98 04/13/2018   ALT 8 04/13/2018   AST 16 04/13/2018   NA 139 04/13/2018   K 4.1 04/13/2018   CL 103 04/13/2018   CREATININE 0.72 04/13/2018   BUN 16 04/13/2018   CO2 28 04/13/2018   TSH 1.08 09/15/2017   HGBA1C 6.0 04/13/2018    Mr Lumbar Spine Wo Contrast  Result Date: 02/24/2018 CLINICAL DATA:  Chronic pain for 2 years. Progressive low back pain extending into the right lower extremity with brain filling. EXAM: MRI LUMBAR SPINE WITHOUT CONTRAST  TECHNIQUE: Multiplanar, multisequence MR imaging of the lumbar spine was performed. No intravenous contrast was administered. COMPARISON:  Lumbar spine radiographs 06/24/2017 FINDINGS: Segmentation: 5 non rib-bearing lumbar type vertebral bodies are present. The lowest fully formed vertebral body is L5. Alignment: Slight degenerative anterolisthesis is present at L4-5. AP alignment is otherwise anatomic. Rightward curvature centered at L3. Vertebrae: Chronic endplate marrow changes are present on the left at L4-5 and bilaterally at L2-3 and L3-4. Vertebral body heights are normal. Conus medullaris and cauda equina: Conus extends to the L1-2 level. Conus and cauda equina appear normal. Paraspinal and other soft tissues: Limited imaging the abdomen is unremarkable. There is no significant adenopathy. No solid organ lesions are present. Disc levels: L1-2: Mild disc bulging and facet hypertrophy is present. There is no significant stenosis. L2-3: A broad-based disc protrusion is present. Mild facet hypertrophy is present. There is no significant stenosis. L3-4: A broad-based disc protrusion is asymmetric to the right. Mild facet hypertrophy is noted bilaterally. This leads to mild bilateral foraminal narrowing. Central canal is patent. L4-5: A leftward disc protrusion is present. This leads to moderate left subarticular and foraminal stenosis. Mild right foraminal narrowing is present. L5-S1: Facet hypertrophy is worse on the left. No significant disc protrusion or stenosis is present. Tarlov cysts are noted at S2, unlikely to be of consequence to the patient. IMPRESSION: 1. Mild foraminal narrowing on the right at L3-4 at  L4-5. 2. Leftward disc protrusion with moderate left subarticular and foraminal stenosis at L4-5. 3. Mild left foraminal narrowing at L3-4. 4. Degenerative changes at L2-3 without significant stenosis. Electronically Signed   By: San Morelle M.D.   On: 02/24/2018 17:37       Assessment &  Plan:   Problem List Items Addressed This Visit    Aortic atherosclerosis ([place])    On crestor.        Depression    On paxil.  Doing better.  Follow.       Relevant Medications   DULoxetine (CYMBALTA) 20 MG capsule   Health care maintenance    Physical today 04/16/18.  Declines immunizations.  Mammogram 02/10/18 - Birads I.        History of CVA (cerebrovascular accident)    Doing well on aspirin and plavix.  Follow.        Hypercholesterolemia    On crestor.  Doing well.  Follow lipid panel and liver function tests.   Lab Results  Component Value Date   CHOL 190 04/13/2018   HDL 74.00 04/13/2018   LDLCALC 98 04/13/2018   LDLDIRECT 177.7 01/19/2013   TRIG 90.0 04/13/2018   CHOLHDL 3 04/13/2018        Relevant Orders   Lipid panel   Hepatic function panel   Hyperglycemia    Low carb diet and exercise.  Follow met b and a1c.       Relevant Orders   Hemoglobin A1c   Hypertension    Blood pressure under good control.  Continue same medication regimen.  Follow pressures.  Follow metabolic panel.        Relevant Orders   Basic metabolic panel   Labial lesion    States occurred after she fell.  Previously evaluated by gyn.  Persistent lesion and pain.  Refer back to gyn for further evaluation.       Relevant Orders   Ambulatory referral to Gynecology   Leg pain    Better on gabapentin.       Thyroid cancer ([place])    S/p thyroidectomy - papillary carcinoma.  On thyroid replacement.  Follow tsh.  Followed by Dr Gabriel Carina.       Relevant Orders   TSH       Einar Pheasant, MD

## 2018-04-16 NOTE — Assessment & Plan Note (Signed)
Physical today 04/16/18.  Declines immunizations.  Mammogram 02/10/18 - Birads I.

## 2018-04-18 ENCOUNTER — Encounter: Payer: Self-pay | Admitting: Internal Medicine

## 2018-04-18 DIAGNOSIS — N9089 Other specified noninflammatory disorders of vulva and perineum: Secondary | ICD-10-CM | POA: Insufficient documentation

## 2018-04-18 DIAGNOSIS — M79606 Pain in leg, unspecified: Secondary | ICD-10-CM | POA: Insufficient documentation

## 2018-04-18 DIAGNOSIS — M25551 Pain in right hip: Secondary | ICD-10-CM | POA: Insufficient documentation

## 2018-04-18 NOTE — Assessment & Plan Note (Signed)
S/p thyroidectomy - papillary carcinoma.  On thyroid replacement.  Follow tsh.  Followed by Dr Solum.   

## 2018-04-18 NOTE — Assessment & Plan Note (Signed)
Doing well on aspirin and plavix.  Follow.   

## 2018-04-18 NOTE — Assessment & Plan Note (Signed)
On crestor.  Doing well.  Follow lipid panel and liver function tests.   Lab Results  Component Value Date   CHOL 190 04/13/2018   HDL 74.00 04/13/2018   LDLCALC 98 04/13/2018   LDLDIRECT 177.7 01/19/2013   TRIG 90.0 04/13/2018   CHOLHDL 3 04/13/2018

## 2018-04-18 NOTE — Assessment & Plan Note (Signed)
Blood pressure under good control.  Continue same medication regimen.  Follow pressures.  Follow metabolic panel.   

## 2018-04-18 NOTE — Assessment & Plan Note (Signed)
States occurred after she fell.  Previously evaluated by gyn.  Persistent lesion and pain.  Refer back to gyn for further evaluation.

## 2018-04-18 NOTE — Assessment & Plan Note (Signed)
Better on gabapentin.

## 2018-04-18 NOTE — Assessment & Plan Note (Signed)
Low carb diet and exercise.  Follow met b and a1c.  

## 2018-04-18 NOTE — Assessment & Plan Note (Signed)
On crestor.   

## 2018-04-18 NOTE — Assessment & Plan Note (Signed)
On paxil.  Doing better.  Follow.

## 2018-04-20 ENCOUNTER — Telehealth: Payer: Self-pay

## 2018-04-20 NOTE — Telephone Encounter (Signed)
Pt scheduled for tomorrow

## 2018-04-20 NOTE — Telephone Encounter (Signed)
Copied from [place] [id number]. Topic: Quick Communication - See Telephone Encounter >> Apr 20, 2018  3:20 PM Rutherford Nail, [place] wrote: CRM for notification. See Telephone encounter for: 04/20/18. Patient's daughter calling and states that the patient had just seen Dr Nicki Reaper last week. States that she is now complaining of burning with urination and constant feeling of needing to go. Would like to know if she could being the patient by to just give a urine sample? Please advise.

## 2018-04-21 ENCOUNTER — Other Ambulatory Visit: Payer: Self-pay

## 2018-04-21 ENCOUNTER — Ambulatory Visit (INDEPENDENT_AMBULATORY_CARE_PROVIDER_SITE_OTHER): Payer: Medicare Other | Admitting: Internal Medicine

## 2018-04-21 ENCOUNTER — Encounter: Payer: Self-pay | Admitting: Internal Medicine

## 2018-04-21 VITALS — BP 158/78 | HR 69 | Temp 97.6°F | Resp 16 | Wt 143.0 lb

## 2018-04-21 DIAGNOSIS — R300 Dysuria: Secondary | ICD-10-CM

## 2018-04-21 DIAGNOSIS — N9089 Other specified noninflammatory disorders of vulva and perineum: Secondary | ICD-10-CM | POA: Diagnosis not present

## 2018-04-21 DIAGNOSIS — I10 Essential (primary) hypertension: Secondary | ICD-10-CM

## 2018-04-21 DIAGNOSIS — N3000 Acute cystitis without hematuria: Secondary | ICD-10-CM

## 2018-04-21 LAB — POCT URINALYSIS DIPSTICK
Bilirubin, UA: NEGATIVE
Blood, UA: NEGATIVE
Glucose, UA: NEGATIVE
Ketones, UA: NEGATIVE
Nitrite, UA: NEGATIVE
Protein, UA: NEGATIVE
Spec Grav, UA: 1.015 (ref 1.010–1.025)
Urobilinogen, UA: 0.2 E.U./dL
pH, UA: 7 (ref 5.0–8.0)

## 2018-04-21 LAB — URINALYSIS, MICROSCOPIC ONLY

## 2018-04-21 MED ORDER — CEFDINIR 300 MG PO CAPS: 300.0000 mg | ORAL_CAPSULE | Freq: Two times a day (BID) | ORAL | 0 refills | Status: DC

## 2018-04-21 NOTE — Progress Notes (Signed)
Patient ID: Tamara [name], female   DOB: 1927/03/10, 85 y.o.   MRN: [id number]   Subjective:    Patient ID: Tamara [name], female    DOB: [birth year], 85 y.o.   MRN: [id number]  HPI  Patient here as a work in with concerns regarding burning with urination.  I saw her last week.  No urinary symptoms then.  She did report a previous injury and gyn evaluation for labial lesion.  See note.  Reports that over this past weekend she started having increased urinary frequency and dysuria.  No hematuria.  No vaginal discharge.  No abdominal pain or back pain.  No fever.  Eating and drinking.  Some increased urgency.  Discussed the continued discomfort - labia.  She had talked with gyn.  Did not tolerate lidocaine ointment.  Plans f/u with gyn.    Past Medical History:  Diagnosis Date   Anemia    Arthritis    Chicken pox    Colitis    Degenerative joint disease    Diverticulitis    Diverticulosis    Fibrocystic breast disease    GERD (gastroesophageal reflux disease)    EGD - gastritis/mild duodenitis, previous positive H. pylori   Glaucoma    Heart murmur    Helicobacter pylori ab+    History of depression    History of skin cancer    facial   Hypercholesterolemia    Hypertension    IBS (irritable bowel syndrome)    Internal hemorrhoids    Migraines    Osteopenia    Stroke ([place])    Thyroid disease    Tubular adenoma of colon    Vitamin D deficiency    Past Surgical History:  Procedure Laterality Date   ABDOMINAL HYSTERECTOMY  1973   secondary to bleeding   APPENDECTOMY     BILATERAL SALPINGOOPHORECTOMY  2004   complex ovarian cyst (left) - benign   BREAST BIOPSY Bilateral    x4- neg   MANDIBLE FRACTURE SURGERY     VENTRAL HERNIA REPAIR  2005   Dr. Tamala Julian   Family History  Problem Relation Age of Onset   Esophageal cancer Mother    Stomach cancer Mother    Heart disease Father    Cirrhosis Brother    Parkinson's disease Brother     Ovarian cancer Sister    Colon cancer Sister        half-sister   Migraines Daughter    Heart attack Son    Breast cancer Neg Hx    Social History   Socioeconomic History   Marital status: Widowed    Spouse name: Not on file   Number of children: 2   Years of education: Not on file   Highest education level: Not on file  Occupational History   Occupation: retired  Scientist, product/process development strain: Not hard at [employer] insecurity:    Worry: Never true    Inability: Never true   Transportation needs:    Medical: No    Non-medical: No  Tobacco Use   Smoking status: Never Smoker   Smokeless tobacco: Never Used  Substance and Sexual Activity   Alcohol use: No    Alcohol/week: 0.0 standard drinks   Drug use: No   Sexual activity: Never  Lifestyle   Physical activity:    Days per week: Not on file    Minutes per session: Not on file   Stress: Not at all  Relationships  Social connections:    Talks on phone: Patient refused    Gets together: Patient refused    Attends religious service: Patient refused    Active member of club or organization: Patient refused    Attends meetings of clubs or organizations: Patient refused    Relationship status: Patient refused  Other Topics Concern   Not on file  Social History Narrative   Not on file    Outpatient Encounter Medications as of 04/21/2018  Medication Sig   aspirin 81 MG tablet Take 81 mg by mouth daily.   cefdinir (OMNICEF) 300 MG capsule Take 1 capsule (300 mg total) by mouth 2 (two) times daily.   clopidogrel (PLAVIX) 75 MG tablet Take 1 tablet (75 mg total) by mouth daily.   DULoxetine (CYMBALTA) 20 MG capsule Take 20 mg by mouth daily as needed.   gabapentin (NEURONTIN) 300 MG capsule Take 300 mg by mouth 2 (two) times daily as needed.   levothyroxine (SYNTHROID, LEVOTHROID) 75 MCG tablet Take 75 mcg by mouth daily.   losartan (COZAAR) 50 MG tablet TAKE 1 TABLET EVERY DAY     PARoxetine (PAXIL) 30 MG tablet Take 1 tablet (30 mg total) by mouth daily.   rosuvastatin (CRESTOR) 40 MG tablet Take 1 tablet (40 mg total) by mouth daily.   temazepam (RESTORIL) 15 MG capsule TAKE 1 CAPSULE AT BEDTIME   No facility-administered encounter medications on file as of 04/21/2018.     Review of Systems  Constitutional: Negative for appetite change and fever.  Gastrointestinal: Negative for abdominal pain, diarrhea, nausea and vomiting.  Genitourinary: Positive for dysuria, frequency and urgency. Negative for vaginal discharge and vaginal pain.  Musculoskeletal: Negative for back pain.  Skin: Negative for color change and rash.       Objective:    Physical Exam Constitutional:      General: She is not in acute distress.    Appearance: Normal appearance.  Neck:     Thyroid: No thyromegaly.  Cardiovascular:     Rate and Rhythm: Normal rate and regular rhythm.  Pulmonary:     Effort: No respiratory distress.     Breath sounds: Normal breath sounds. No wheezing.  Abdominal:     General: Bowel sounds are normal.     Palpations: Abdomen is soft.     Tenderness: There is no abdominal tenderness.  Musculoskeletal:     Comments: No CVA tenderness  Skin:    Findings: No erythema or rash.  Neurological:     Mental Status: She is alert.  Psychiatric:        Mood and Affect: Mood normal.        Behavior: Behavior normal.     BP (!) 158/78    Pulse 69    Temp 97.6 F (36.4 C) (Oral)    Resp 16    Wt 143 lb (64.9 kg)    LMP 01/17/1972    SpO2 97%    BMI 24.55 kg/m  Wt Readings from Last 3 Encounters:  04/21/18 143 lb (64.9 kg)  04/16/18 142 lb 12.8 oz (64.8 kg)  12/29/17 143 lb 9.6 oz (65.1 kg)     Lab Results  Component Value Date   WBC 5.6 12/29/2017   HGB 12.0 12/29/2017   HCT 36.4 12/29/2017   PLT 298.0 12/29/2017   GLUCOSE 99 04/13/2018   CHOL 190 04/13/2018   TRIG 90.0 04/13/2018   HDL 74.00 04/13/2018   LDLDIRECT 177.7 01/19/2013   LDLCALC  98 04/13/2018  ALT 8 04/13/2018   AST 16 04/13/2018   NA 139 04/13/2018   K 4.1 04/13/2018   CL 103 04/13/2018   CREATININE 0.72 04/13/2018   BUN 16 04/13/2018   CO2 28 04/13/2018   TSH 1.08 09/15/2017   HGBA1C 6.0 04/13/2018    Mr Lumbar Spine Wo Contrast  Result Date: 02/24/2018 CLINICAL DATA:  Chronic pain for 2 years. Progressive low back pain extending into the right lower extremity with brain filling. EXAM: MRI LUMBAR SPINE WITHOUT CONTRAST TECHNIQUE: Multiplanar, multisequence MR imaging of the lumbar spine was performed. No intravenous contrast was administered. COMPARISON:  Lumbar spine radiographs 06/24/2017 FINDINGS: Segmentation: 5 non rib-bearing lumbar type vertebral bodies are present. The lowest fully formed vertebral body is L5. Alignment: Slight degenerative anterolisthesis is present at L4-5. AP alignment is otherwise anatomic. Rightward curvature centered at L3. Vertebrae: Chronic endplate marrow changes are present on the left at L4-5 and bilaterally at L2-3 and L3-4. Vertebral body heights are normal. Conus medullaris and cauda equina: Conus extends to the L1-2 level. Conus and cauda equina appear normal. Paraspinal and other soft tissues: Limited imaging the abdomen is unremarkable. There is no significant adenopathy. No solid organ lesions are present. Disc levels: L1-2: Mild disc bulging and facet hypertrophy is present. There is no significant stenosis. L2-3: A broad-based disc protrusion is present. Mild facet hypertrophy is present. There is no significant stenosis. L3-4: A broad-based disc protrusion is asymmetric to the right. Mild facet hypertrophy is noted bilaterally. This leads to mild bilateral foraminal narrowing. Central canal is patent. L4-5: A leftward disc protrusion is present. This leads to moderate left subarticular and foraminal stenosis. Mild right foraminal narrowing is present. L5-S1: Facet hypertrophy is worse on the left. No significant disc protrusion  or stenosis is present. Tarlov cysts are noted at S2, unlikely to be of consequence to the patient. IMPRESSION: 1. Mild foraminal narrowing on the right at L3-4 at L4-5. 2. Leftward disc protrusion with moderate left subarticular and foraminal stenosis at L4-5. 3. Mild left foraminal narrowing at L3-4. 4. Degenerative changes at L2-3 without significant stenosis. Electronically Signed   By: San Morelle M.D.   On: 02/24/2018 17:37       Assessment & Plan:   Problem List Items Addressed This Visit    Hypertension    Blood pressure elevated today.  Was ok last week when evaluated.  Treat infection.  Follow pressures.  Hold on making changes in her medication.  Notify me if persistent elevation.        Labial lesion    Labial lesion.  Planning to f/u with gyn.        UTI (urinary tract infection)    Symptoms c/w uti.  Urine dip - trace leuks.  Send culture.  Treat with omnicef.  Follow closely.        Relevant Medications   cefdinir (OMNICEF) 300 MG capsule    Other Visit Diagnoses    Dysuria    -  Primary   Relevant Orders   Urine Microscopic (Completed)   Urine Culture   POCT urinalysis dipstick (Completed)       Einar Pheasant, MD

## 2018-04-21 NOTE — Patient Instructions (Signed)
Take a probiotic daily while you are on the antibiotic and for two weeks after completing the antibiotic   

## 2018-04-22 ENCOUNTER — Other Ambulatory Visit: Payer: Self-pay

## 2018-04-22 ENCOUNTER — Encounter: Payer: Self-pay | Admitting: Internal Medicine

## 2018-04-22 ENCOUNTER — Other Ambulatory Visit: Payer: Self-pay | Admitting: Internal Medicine

## 2018-04-22 ENCOUNTER — Ambulatory Visit: Payer: Self-pay

## 2018-04-22 DIAGNOSIS — N390 Urinary tract infection, site not specified: Secondary | ICD-10-CM | POA: Insufficient documentation

## 2018-04-22 LAB — URINE CULTURE
MICRO NUMBER:: 348989
Result:: NO GROWTH
SPECIMEN QUALITY:: ADEQUATE

## 2018-04-22 MED ORDER — CIPROFLOXACIN HCL 250 MG PO TABS: 250.0000 mg | ORAL_TABLET | Freq: Two times a day (BID) | ORAL | 0 refills | Status: DC

## 2018-04-22 NOTE — Telephone Encounter (Signed)
Need to confirm pt doing ok.  No lip or tongue swelling.  No trouble breathing.  omnicef is in the same family as cephalexin - so would avoid both of these.  She has recently taken cipro.  If did ok with this medication, I would recommend cipro 250mg  bid x 5 days.  Let us know if any problems.

## 2018-04-22 NOTE — Telephone Encounter (Signed)
Daughter requests new Rx be sent to [facility].

## 2018-04-22 NOTE — Telephone Encounter (Signed)
rx sent in for cipro. omnicef added to allergies

## 2018-04-22 NOTE — Assessment & Plan Note (Signed)
Labial lesion.  Planning to f/u with gyn.

## 2018-04-22 NOTE — Assessment & Plan Note (Signed)
Blood pressure elevated today.  Was ok last week when evaluated.  Treat infection.  Follow pressures.  Hold on making changes in her medication.  Notify me if persistent elevation.

## 2018-04-22 NOTE — Telephone Encounter (Signed)
Noted.  Please confirm pt doing ok.   

## 2018-04-22 NOTE — Telephone Encounter (Signed)
Is there something else that can be sent in for her?

## 2018-04-22 NOTE — Assessment & Plan Note (Signed)
Symptoms c/w uti.  Urine dip - trace leuks.  Send culture.  Treat with omnicef.  Follow closely.

## 2018-04-22 NOTE — Telephone Encounter (Signed)
Rec'd. call from pt's daughter, reporting side effect of "severe burning in throat and all the way down the esophagus" when she took 1st dose of Omnicef, last evening.  Daughter reported the pt. described that the pill "set her on fire."  Denied any other side effects.  Stated that the pt. has sensitivity to a lot of medications.    Asking for an alternate antibiotic in place of Omnicef.  Daughter stated that pt. cannot take  Amoxicillin, due to nausea.  Stated the pt. has been able to tolerate Cephalexin.  Will send Triage note to PCP office, for further recommendations.          Reason for Disposition . Caller has URGENT medication question about med that PCP prescribed and triager unable to answer question  Answer Assessment - Initial Assessment Questions 1. SYMPTOMS: "Do you have any symptoms?"     Had burning sensation in throat and esophagus with only taking one pill ; "set her on fire" 2. SEVERITY: If symptoms are present, ask "Are they mild, moderate or severe?"     Felt the reaction was severe  Protocols used: MEDICATION QUESTION CALL-A-AH

## 2018-04-22 NOTE — Telephone Encounter (Signed)
rx ok'd for temazepam #30 with no refills.   °

## 2018-04-24 NOTE — Telephone Encounter (Signed)
See result note.  

## 2018-04-30 ENCOUNTER — Telehealth: Payer: Self-pay

## 2018-04-30 ENCOUNTER — Other Ambulatory Visit: Payer: Self-pay

## 2018-04-30 ENCOUNTER — Ambulatory Visit: Payer: Medicare Other | Admitting: Internal Medicine

## 2018-04-30 ENCOUNTER — Encounter (HOSPITAL_COMMUNITY): Payer: Self-pay | Admitting: Emergency Medicine

## 2018-04-30 ENCOUNTER — Emergency Department (HOSPITAL_COMMUNITY)
Admission: EM | Admit: 2018-04-30 | Discharge: 2018-04-30 | Disposition: A | Discharge status: Home / Self Care | Payer: Medicare Other | Attending: Emergency Medicine | Admitting: Emergency Medicine

## 2018-04-30 ENCOUNTER — Emergency Department (HOSPITAL_COMMUNITY): Payer: Medicare Other

## 2018-04-30 DIAGNOSIS — Z7902 Long term (current) use of antithrombotics/antiplatelets: Secondary | ICD-10-CM | POA: Diagnosis not present

## 2018-04-30 DIAGNOSIS — R21 Rash and other nonspecific skin eruption: Secondary | ICD-10-CM | POA: Diagnosis not present

## 2018-04-30 DIAGNOSIS — I10 Essential (primary) hypertension: Secondary | ICD-10-CM | POA: Diagnosis not present

## 2018-04-30 DIAGNOSIS — Z79899 Other long term (current) drug therapy: Secondary | ICD-10-CM | POA: Insufficient documentation

## 2018-04-30 DIAGNOSIS — R509 Fever, unspecified: Secondary | ICD-10-CM

## 2018-04-30 DIAGNOSIS — R0602 Shortness of breath: Secondary | ICD-10-CM | POA: Diagnosis not present

## 2018-04-30 DIAGNOSIS — B349 Viral infection, unspecified: Secondary | ICD-10-CM | POA: Diagnosis not present

## 2018-04-30 DIAGNOSIS — R05 Cough: Secondary | ICD-10-CM | POA: Diagnosis not present

## 2018-04-30 DIAGNOSIS — Z7982 Long term (current) use of aspirin: Secondary | ICD-10-CM | POA: Insufficient documentation

## 2018-04-30 DIAGNOSIS — I4510 Unspecified right bundle-branch block: Secondary | ICD-10-CM | POA: Diagnosis not present

## 2018-04-30 LAB — COMPREHENSIVE METABOLIC PANEL
ALT: 23 U/L (ref 0–44)
AST: 29 U/L (ref 15–41)
Albumin: 3.1 g/dL — ABNORMAL LOW (ref 3.5–5.0)
Alkaline Phosphatase: 59 U/L (ref 38–126)
Anion gap: 8 (ref 5–15)
BUN: 13 mg/dL (ref 8–23)
CO2: 25 mmol/L (ref 22–32)
Calcium: 7.9 mg/dL — ABNORMAL LOW (ref 8.9–10.3)
Chloride: 104 mmol/L (ref 98–111)
Creatinine, Ser: 0.81 mg/dL (ref 0.44–1.00)
GFR calc Af Amer: >60 mL/min (ref >60)
GFR calc non Af Amer: >60 mL/min (ref >60)
Glucose, Bld: 105 mg/dL — ABNORMAL HIGH (ref 70–99)
Potassium: 3.5 mmol/L (ref 3.5–5.1)
Sodium: 137 mmol/L (ref 135–145)
Total Bilirubin: 0.7 mg/dL (ref 0.3–1.2)
Total Protein: 5.9 g/dL — ABNORMAL LOW (ref 6.5–8.1)

## 2018-04-30 LAB — CBC WITH DIFFERENTIAL/PLATELET
Abs Immature Granulocytes: 0.01 10*3/uL (ref 0.00–0.07)
Basophils Absolute: 0 10*3/uL (ref 0.0–0.1)
Basophils Relative: 0 %
Eosinophils Absolute: 0 10*3/uL (ref 0.0–0.5)
Eosinophils Relative: 0 %
HCT: 33.9 % — ABNORMAL LOW (ref 36.0–46.0)
Hemoglobin: 10.4 g/dL — ABNORMAL LOW (ref 12.0–15.0)
Immature Granulocytes: 0 %
Lymphocytes Relative: 6 %
Lymphs Abs: 0.5 10*3/uL — ABNORMAL LOW (ref 0.7–4.0)
MCH: 27.2 pg (ref 26.0–34.0)
MCHC: 30.7 g/dL (ref 30.0–36.0)
MCV: 88.5 fL (ref 80.0–100.0)
Monocytes Absolute: 0.3 10*3/uL (ref 0.1–1.0)
Monocytes Relative: 4 %
Neutro Abs: 7.1 10*3/uL (ref 1.7–7.7)
Neutrophils Relative %: 90 %
Platelets: 260 10*3/uL (ref 150–400)
RBC: 3.83 MIL/uL — ABNORMAL LOW (ref 3.87–5.11)
RDW: 14.1 % (ref 11.5–15.5)
WBC: 7.9 10*3/uL (ref 4.0–10.5)
nRBC: 0 % (ref 0.0–0.2)

## 2018-04-30 LAB — URINALYSIS, ROUTINE W REFLEX MICROSCOPIC
Bilirubin Urine: NEGATIVE
Glucose, UA: NEGATIVE mg/dL
Ketones, ur: NEGATIVE mg/dL
Nitrite: NEGATIVE
Protein, ur: NEGATIVE mg/dL
Specific Gravity, Urine: 1.017 (ref 1.005–1.030)
pH: 5 (ref 5.0–8.0)

## 2018-04-30 LAB — LACTIC ACID, PLASMA: Lactic Acid, Venous: 1 mmol/L (ref 0.5–1.9)

## 2018-04-30 MED ORDER — FENTANYL CITRATE (PF) 100 MCG/2ML IJ SOLN
50.0000 ug | Freq: Once | INTRAMUSCULAR | Status: AC
  Administered 2018-04-30: 15:00:00 50 ug via INTRAVENOUS
  Filled 2018-04-30: qty 2

## 2018-04-30 MED ORDER — SODIUM CHLORIDE 0.9 % IV BOLUS
500.0000 mL | Freq: Once | INTRAVENOUS | Status: AC
  Administered 2018-04-30: 500 mL via INTRAVENOUS

## 2018-04-30 NOTE — Discharge Instructions (Signed)
Person Under Monitoring Name: Tamara [name]  Location: [place] [address]   Infection Prevention Recommendations for Individuals Confirmed to have, or Being Evaluated for, 2019 Novel Coronavirus (COVID-19) Infection Who Receive Care at Home  Individuals who are confirmed to have, or are being evaluated for, COVID-19 should follow the prevention steps below until a healthcare provider or local or state health department says they can return to normal activities.  Stay home except to get medical care You should restrict activities outside your home, except for getting medical care. Do not go to work, school, or public areas, and do not use public transportation or taxis.  Call ahead before visiting your doctor Before your medical appointment, call the healthcare provider and tell them that you have, or are being evaluated for, COVID-19 infection. This will help the healthcare providers office take steps to keep other people from getting infected. Ask your healthcare provider to call the local or state health department.  Monitor your symptoms Seek prompt medical attention if your illness is worsening (e.g., difficulty breathing). Before going to your medical appointment, call the healthcare provider and tell them that you have, or are being evaluated for, COVID-19 infection. Ask your healthcare provider to call the local or state health department.  Wear a facemask You should wear a facemask that covers your nose and mouth when you are in the same room with other people and when you visit a healthcare provider. People who live with or visit you should also wear a facemask while they are in the same room with you.  Separate yourself from other people in your home As much as possible, you should stay in a different room from other people in your home. Also, you should use a separate bathroom, if available.  Avoid sharing household items You should not  share dishes, drinking glasses, cups, eating utensils, towels, bedding, or other items with other people in your home. After using these items, you should wash them thoroughly with soap and water.  Cover your coughs and sneezes Cover your mouth and nose with a tissue when you cough or sneeze, or you can cough or sneeze into your sleeve. Throw used tissues in a lined trash can, and immediately wash your hands with soap and water for at least 20 seconds or use an alcohol-based hand rub.  Wash your [facility] your hands often and thoroughly with soap and water for at least 20 seconds. You can use an alcohol-based hand sanitizer if soap and water are not available and if your hands are not visibly dirty. Avoid touching your eyes, nose, and mouth with unwashed hands.   Prevention Steps for Caregivers and Household Members of Individuals Confirmed to have, or Being Evaluated for, COVID-19 Infection Being Cared for in the Home  If you live with, or provide care at home for, a person confirmed to have, or being evaluated for, COVID-19 infection please follow these guidelines to prevent infection:  Follow healthcare providers instructions Make sure that you understand and can help the patient follow any healthcare provider instructions for all care.  Provide for the patients basic needs You should help the patient with basic needs in the home and provide support for getting groceries, prescriptions, and other personal needs.  Monitor the patients symptoms If they are getting sicker, call his or her medical provider and tell them that the patient has, or is being evaluated for, COVID-19 infection. This will help the healthcare providers  office take steps to keep other people from getting infected. Ask the healthcare provider to call the local or state health department.  Limit the number of people who have contact with the patient If possible, have only one caregiver for the  patient. Other household members should stay in another home or place of residence. If this is not possible, they should stay in another room, or be separated from the patient as much as possible. Use a separate bathroom, if available. Restrict visitors who do not have an essential need to be in the home.  Keep older adults, very young children, and other sick people away from the patient Keep older adults, very young children, and those who have compromised immune systems or chronic health conditions away from the patient. This includes people with chronic heart, lung, or kidney conditions, diabetes, and cancer.  Ensure good ventilation Make sure that shared spaces in the home have good air flow, such as from an air conditioner or an opened window, weather permitting.  Wash your hands often Wash your hands often and thoroughly with soap and water for at least 20 seconds. You can use an alcohol based hand sanitizer if soap and water are not available and if your hands are not visibly dirty. Avoid touching your eyes, nose, and mouth with unwashed hands. Use disposable paper towels to dry your hands. If not available, use dedicated cloth towels and replace them when they become wet.  Wear a facemask and gloves Wear a disposable facemask at all times in the room and gloves when you touch or have contact with the patients blood, body fluids, and/or secretions or excretions, such as sweat, saliva, sputum, nasal mucus, vomit, urine, or feces.  Ensure the mask fits over your nose and mouth tightly, and do not touch it during use. Throw out disposable facemasks and gloves after using them. Do not reuse. Wash your hands immediately after removing your facemask and gloves. If your personal clothing becomes contaminated, carefully remove clothing and launder. Wash your hands after handling contaminated clothing. Place all used disposable facemasks, gloves, and other waste in a lined container before  disposing them with other household waste. Remove gloves and wash your hands immediately after handling these items.  Do not share dishes, glasses, or other household items with the patient Avoid sharing household items. You should not share dishes, drinking glasses, cups, eating utensils, towels, bedding, or other items with a patient who is confirmed to have, or being evaluated for, COVID-19 infection. After the person uses these items, you should wash them thoroughly with soap and water.  Wash laundry thoroughly Immediately remove and wash clothes or bedding that have blood, body fluids, and/or secretions or excretions, such as sweat, saliva, sputum, nasal mucus, vomit, urine, or feces, on them. Wear gloves when handling laundry from the patient. Read and follow directions on labels of laundry or clothing items and detergent. In general, wash and dry with the warmest temperatures recommended on the label.  Clean all areas the individual has used often Clean all touchable surfaces, such as counters, tabletops, doorknobs, bathroom fixtures, toilets, phones, keyboards, tablets, and bedside tables, every day. Also, clean any surfaces that may have blood, body fluids, and/or secretions or excretions on them. Wear gloves when cleaning surfaces the patient has come in contact with. Use a diluted bleach solution (e.g., dilute bleach with 1 part bleach and 10 parts water) or a household disinfectant with a label that says EPA-registered for coronaviruses. To make a  bleach solution at home, add 1 tablespoon of bleach to 1 quart (4 cups) of water. For a larger supply, add  cup of bleach to 1 gallon (16 cups) of water. Read labels of cleaning products and follow recommendations provided on product labels. Labels contain instructions for safe and effective use of the cleaning product including precautions you should take when applying the product, such as wearing gloves or eye protection and making sure you  have good ventilation during use of the product. Remove gloves and wash hands immediately after cleaning.  Monitor yourself for signs and symptoms of illness Caregivers and household members are considered close contacts, should monitor their health, and will be asked to limit movement outside of the home to the extent possible. Follow the monitoring steps for close contacts listed on the symptom monitoring form.   ? If you have additional questions, contact your local health department or call the epidemiologist on call at [phone] (available 24/7). ? This guidance is subject to change. For the most up-to-date guidance from [facility], please refer to their website: [email or web address].pl

## 2018-04-30 NOTE — Telephone Encounter (Signed)
Called pt and spoke w/ pt's daughter, Molleigh Huot.  Pt's daughter stated that pt started feeling bad on yesterday and it "hit her like a ton of bricks".  Stated pt had a fever of 101.5 on yesterday but went down to 99.5 after she gave pt ibuprofen.  Also stated that pt had chills and shaking, very bad joint pain that kept her from sleeping, headache, a scratchy throat, a little dry cough and nausea.  Denies having shortness of breath. Stated pt had diarrhea but feels it was coming from the Miralax that the pt took on yesterday.    Stated pt is feeling better today.  Reported BP as 131/78 and O2 between 93-96.  Said that pt was sleeping, is eating some, and drinking fluids.    Pt's daughter was concerned since she said that pt's daughter works in nursing care w/ the senior patient population and was sick last month w/ these same symptoms.  Pt's daughter said that she was tested for strept throat, flu and COVID-19 around March 18 and all test came back negative.  Pt's daughter said that she had a total of 3 weeks of self-quarantine.    Pt's daughter said that pt has not been around anyone who is sick.  No travel.  Said pt does go to [employer], to get gas, and to [employer] to get books but not to very many places.  Scheduled pt for phone visit for this morning at 11:00 am.  Best number to call is daughter's cell phone:  [phone].

## 2018-04-30 NOTE — ED Notes (Signed)
Contact: Janalee Grobe (daughter)  Phone: [phone]  Please contact with any updates, thanks!

## 2018-04-30 NOTE — ED Triage Notes (Addendum)
Patient reports new onset generalized body aches, fever (100.47F last night), dry cough, and nausea since yesterday. Also endorses very mild shortness of breath, more with cough and exertion. Denies CP, dizziness, vomiting. Afebrile in triage. Resp e/u, skin w/d. She also states her daughter was recently sick with similar symptoms and tested for viruses, but everything came back okay for her.

## 2018-04-30 NOTE — Telephone Encounter (Signed)
Copied from [place] [phone]. Topic: General - Other >> Apr 30, 2018  8:23 AM Leward Quan A wrote: Reason for CRM: Carol Ada patient daughter called to say that on 04/29/2018 around 6 pm patient had a temp of 101.5 but was was given Ibuprofen and after an hour fever broke this am fever is at 99.8 BP 131/78 Co2 93. She is asking can something be sent to the Pharmacy for nausea because she is having that on and off. Any questions please call back Ph# [phone]

## 2018-04-30 NOTE — ED Notes (Signed)
Patient verbalized understanding of discharge instructions and denies any further needs or questions at this time. VS stable. Patient ambulatory with steady gait. Assisted to ED entrance in wheelchair.   

## 2018-04-30 NOTE — ED Provider Notes (Signed)
[facility] EMERGENCY DEPARTMENT Provider Note   CSN: [id number] Arrival date & time: 04/30/18  1219    History   Chief Complaint Chief Complaint  Patient presents with  . Fever  . Shortness of Breath    HPI Tamara [name] is a 85 y.o. female.     HPI Patient presents with fever and myalgias.  Patient states the temperature went all the way up to 100.2 last night.  Has had some nausea and had decreased appetite although she states she is hungry now.  Is had a little bit of a cough without sputum production.  No dysuria.  No vomiting.  States that she aches all over.  Daughter was sick about a month ago with URI type symptoms and was reportedly tested for among other things Covid at that time.  Patient has limited contact with other people.  No abdominal pain.  Patient has a red rash on her lower back.  States she gets like this when she takes Tylenol. Past Medical History:  Diagnosis Date  . Anemia   . Arthritis   . Chicken pox   . Colitis   . Degenerative joint disease   . Diverticulitis   . Diverticulosis   . Fibrocystic breast disease   . GERD (gastroesophageal reflux disease)    EGD - gastritis/mild duodenitis, previous positive H. pylori  . Glaucoma   . Heart murmur   . Helicobacter pylori ab+   . History of depression   . History of skin cancer    facial  . Hypercholesterolemia   . Hypertension   . IBS (irritable bowel syndrome)   . Internal hemorrhoids   . Migraines   . Osteopenia   . Stroke ([place])   . Thyroid disease   . Tubular adenoma of colon   . Vitamin D deficiency     Patient Active Problem List   Diagnosis Date Noted  . UTI (urinary tract infection) 04/22/2018  . Labial lesion 04/18/2018  . Leg pain 04/18/2018  . Recurrent UTI 01/25/2017  . Thyroid cancer ([place]) 08/07/2016  . Right arm numbness 05/31/2015  . Aortic atherosclerosis ([place]) 05/15/2015  . CAD (coronary artery disease) 05/15/2015  . Headache, migraine 05/09/2015   . History of colon polyps 05/09/2015  . Arthritis, degenerative 05/09/2015  . Abdominal pain 04/25/2015  . Diarrhea 04/15/2015  . Right knee pain 02/13/2015  . Hyperglycemia 08/11/2014  . Murmur, heart 05/17/2014  . Hyperlipidemia 05/17/2014  . Health care maintenance 04/17/2014  . Carotid stenosis 11/15/2013  . Dysphagia 10/28/2013  . Back pain 06/06/2013  . Internal nasal lesion 06/06/2013  . Fatigue 05/09/2013  . Fluttering heart 10/12/2012  . History of CVA (cerebrovascular accident) 09/28/2012  . Left carotid bruit 01/17/2012  . Osteopenia 01/17/2012  . GERD (gastroesophageal reflux disease) 01/17/2012  . Depression 01/17/2012  . Hypertension 12/18/2011  . Hypercholesterolemia 12/18/2011    Past Surgical History:  Procedure Laterality Date  . ABDOMINAL HYSTERECTOMY  1973   secondary to bleeding  . APPENDECTOMY    . BILATERAL SALPINGOOPHORECTOMY  2004   complex ovarian cyst (left) - benign  . BREAST BIOPSY Bilateral    x4- neg  . MANDIBLE FRACTURE SURGERY    . VENTRAL HERNIA REPAIR  2005   Dr. Tamala Julian     OB History   No obstetric history on file.      Home Medications    Prior to Admission medications   Medication Sig Start Date End Date Taking? Authorizing Provider  acetaminophen (TYLENOL) 325 MG tablet Take 650 mg by mouth every 6 (six) hours as needed for mild pain or fever.    Yes [provider]  Apple Cider Vinegar 500 MG TABS Take 500 mg by mouth daily.   Yes [provider]  aspirin 81 MG tablet Take 81 mg by mouth daily.   Yes [provider]  clopidogrel (PLAVIX) 75 MG tablet Take 1 tablet (75 mg total) by mouth daily. 11/28/17  Yes Gollan, Kathlene November, MD  DULoxetine (CYMBALTA) 20 MG capsule Take 20 mg by mouth daily as needed. 03/26/18  Yes [provider]  gabapentin (NEURONTIN) 300 MG capsule Take 300 mg by mouth 3 (three) times daily as needed (pain).  04/06/18  Yes [provider]  levothyroxine  (SYNTHROID, LEVOTHROID) 75 MCG tablet Take 75 mcg by mouth daily. 12/03/17  Yes [provider]  losartan (COZAAR) 50 MG tablet TAKE 1 TABLET EVERY DAY Patient taking differently: Take 50 mg by mouth daily.  01/26/18  Yes Einar Pheasant, MD  PARoxetine (PAXIL) 30 MG tablet Take 1 tablet (30 mg total) by mouth daily. 12/29/17  Yes Einar Pheasant, MD  polyethylene glycol (MIRALAX / GLYCOLAX) packet Take 17 g by mouth daily as needed for mild constipation.   Yes [provider]  rosuvastatin (CRESTOR) 40 MG tablet Take 1 tablet (40 mg total) by mouth daily. 12/29/17  Yes Einar Pheasant, MD  temazepam (RESTORIL) 15 MG capsule TAKE 1 CAPSULE BY MOUTH AT BEDTIME Patient taking differently: Take 15 mg by mouth at bedtime. TAKE 1 CAPSULE BY MOUTH AT BEDTIME 04/22/18  Yes Einar Pheasant, MD  cefdinir (OMNICEF) 300 MG capsule Take 1 capsule (300 mg total) by mouth 2 (two) times daily. Patient not taking: Reported on 04/30/2018 04/21/18   Einar Pheasant, MD  ciprofloxacin (CIPRO) 250 MG tablet Take 1 tablet (250 mg total) by mouth 2 (two) times daily. Patient not taking: Reported on 04/30/2018 04/22/18   Einar Pheasant, MD    Family History Family History  Problem Relation Age of Onset  . Esophageal cancer Mother   . Stomach cancer Mother   . Heart disease Father   . Cirrhosis Brother   . Parkinson's disease Brother   . Ovarian cancer Sister   . Colon cancer Sister        half-sister  . Migraines Daughter   . Heart attack Son   . Breast cancer Neg Hx     Social History Social History   Tobacco Use  . Smoking status: Never Smoker  . Smokeless tobacco: Never Used  Substance Use Topics  . Alcohol use: No    Alcohol/week: 0.0 standard drinks  . Drug use: No     Allergies   Flagyl [metronidazole]; Omnicef [cefdinir]; Other; Prednisone; Shrimp [shellfish allergy]; Tylenol [acetaminophen]; Vicodin [hydrocodone-acetaminophen]; and Ibuprofen   Review of Systems Review of  Systems  Constitutional: Positive for appetite change, fatigue and fever.  Respiratory: Positive for cough.   Cardiovascular: Negative for chest pain.  Gastrointestinal: Negative for abdominal pain.  Genitourinary: Negative for dysuria.  Skin: Positive for rash.  Neurological: Negative for weakness.  Psychiatric/Behavioral: Negative for confusion.     Physical Exam Updated Vital Signs BP (!) 143/58   Pulse 78   Temp 99.1 F (37.3 C) (Rectal)   Resp 20   LMP 01/17/1972   SpO2 98%   Physical Exam Vitals signs and nursing note reviewed.  HENT:     Head: Normocephalic.     Mouth/Throat:  Pharynx: No pharyngeal swelling or oropharyngeal exudate.  Neck:     Musculoskeletal: Neck supple.  Cardiovascular:     Rate and Rhythm: Regular rhythm.  Pulmonary:     Breath sounds: Normal breath sounds. No wheezing, rhonchi or rales.  Chest:     Chest wall: No tenderness.  Musculoskeletal:     Right lower leg: She exhibits no tenderness.     Left lower leg: She exhibits no tenderness.  Skin:    General: Skin is warm.     Capillary Refill: Capillary refill takes less than 2 seconds.     Comments: Most of lower back going up to mid back is erythematous with some induration.  No drainage.  Has some mild possible scaling along the midline.  Neurological:     Mental Status: She is alert and oriented to person, place, and time.      ED Treatments / Results  Labs (all labs ordered are listed, but only abnormal results are displayed) Labs Reviewed  URINALYSIS, ROUTINE W REFLEX MICROSCOPIC - Abnormal; Notable for the following components:      Result Value   APPearance HAZY (*)    Hgb urine dipstick SMALL (*)    Leukocytes,Ua TRACE (*)    Bacteria, UA RARE (*)    All other components within normal limits  COMPREHENSIVE METABOLIC PANEL - Abnormal; Notable for the following components:   Glucose, Bld 105 (*)    Calcium 7.9 (*)    Total Protein 5.9 (*)    Albumin 3.1 (*)    All  other components within normal limits  CBC WITH DIFFERENTIAL/PLATELET - Abnormal; Notable for the following components:   RBC 3.83 (*)    Hemoglobin 10.4 (*)    HCT 33.9 (*)    Lymphs Abs 0.5 (*)    All other components within normal limits  URINE CULTURE  LACTIC ACID, PLASMA  LACTIC ACID, PLASMA    EKG EKG Interpretation  Date/Time:  Thursday April 30 2018 12:30:36 EDT Ventricular Rate:  75 PR Interval:    QRS Duration: 134 QT Interval:  384 QTC Calculation: 429 R Axis:   79 Text Interpretation:  Sinus rhythm Right bundle branch block RBBB is new Confirmed by Davonna Belling [phone]) on 04/30/2018 2:22:24 PM   Radiology Dg Chest Portable 1 View  Result Date: 04/30/2018 CLINICAL DATA:  Fever, cough and body aches since yesterday. EXAM: PORTABLE CHEST 1 VIEW COMPARISON:  03/14/2017 FINDINGS: Heart size is normal. Chronic aortic atherosclerosis. Chronic elevation the right hemidiaphragm. Allowing for that, the lungs appear clear. No infiltrate, collapse or effusion. IMPRESSION: No active disease. Aortic atherosclerosis. Chronic elevation of the right hemidiaphragm. Electronically Signed   By: Nelson Chimes M.D.   On: 04/30/2018 13:25    Procedures Procedures (including critical care time)  Medications Ordered in ED Medications  sodium chloride 0.9 % bolus 500 mL (0 mLs Intravenous Stopped 04/30/18 1443)  fentaNYL (SUBLIMAZE) injection 50 mcg (50 mcg Intravenous Given 04/30/18 1506)     Initial Impression / Assessment and Plan / ED Course  I have reviewed the triage vital signs and the nursing notes.  Pertinent labs & imaging results that were available during my care of the patient were reviewed by me and considered in my medical decision making (see chart for details).        Patient with fever, but only up to 100.2.  Cough nausea and fatigue.  Work-up overall reassuring.  White count not elevated.  Urine shows white cells without  a lot of bacteria.  Urine had looked similar  recently and had negative culture.  Culture sent now.  Dull headache but doubt meningitis.  Patient would much rather go home.  Will discharge.  Follow-up with PCP as needed.  Given Covid isolation instructions since patient cannot be ruled out.  BRIEANN OSINSKI was evaluated in Emergency Department on 04/30/2018 for the symptoms described in the history of present illness. She was evaluated in the context of the global COVID-19 pandemic, which necessitated consideration that the patient might be at risk for infection with the SARS-CoV-2 virus that causes COVID-19. Institutional protocols and algorithms that pertain to the evaluation of patients at risk for COVID-19 are in a state of rapid change based on information released by regulatory bodies including the CDC and federal and state organizations. These policies and algorithms were followed during the patient's care in the ED.   Final Clinical Impressions(s) / ED Diagnoses   Final diagnoses:  Fever, unspecified fever cause  Viral illness    ED Discharge Orders    None       Davonna Belling, MD 04/30/18 1510

## 2018-04-30 NOTE — Telephone Encounter (Signed)
Spoke with patients daughter about patients symptoms. Advised to take pt to ED so that she can be evaluated. Virtual visit is not ideal for this. Daughter agreed and is taking pt to ED

## 2018-05-01 LAB — URINE CULTURE: Culture: <10000 — AB

## 2018-05-15 ENCOUNTER — Other Ambulatory Visit: Payer: Self-pay | Admitting: Internal Medicine

## 2018-05-21 ENCOUNTER — Telehealth: Payer: Self-pay

## 2018-05-21 NOTE — Telephone Encounter (Signed)
Spoke with patient to reschedule overdue F/U with Dr. Rockey Situ. Advised patient that due to [place] 19 precautions Dr. Rockey Situ is scheduling telephone or video visits at this time. Patient states she is not having any problems and prefers to wait until she can be seen in the office. Informed patient that recall will be placed for 08/29/2018.

## 2018-05-22 DIAGNOSIS — H353131 Nonexudative age-related macular degeneration, bilateral, early dry stage: Secondary | ICD-10-CM | POA: Diagnosis not present

## 2018-05-26 DIAGNOSIS — E559 Vitamin D deficiency, unspecified: Secondary | ICD-10-CM | POA: Diagnosis not present

## 2018-06-22 ENCOUNTER — Other Ambulatory Visit: Payer: Self-pay | Admitting: Internal Medicine

## 2018-06-25 ENCOUNTER — Other Ambulatory Visit: Payer: Self-pay | Admitting: Internal Medicine

## 2018-06-29 NOTE — Telephone Encounter (Signed)
Patient's daughter calling to check status of refill request. Patient is out of medication.

## 2018-06-30 NOTE — Telephone Encounter (Signed)
rx sent in for temazepam #30 with one refill.  Just received.  Please notify and apologize for inconvenience.

## 2018-06-30 NOTE — Telephone Encounter (Signed)
Left detailed voicemail

## 2018-07-23 ENCOUNTER — Other Ambulatory Visit: Payer: Self-pay | Admitting: Internal Medicine

## 2018-08-05 DIAGNOSIS — Z85850 Personal history of malignant neoplasm of thyroid: Secondary | ICD-10-CM | POA: Diagnosis not present

## 2018-08-05 DIAGNOSIS — R5382 Chronic fatigue, unspecified: Secondary | ICD-10-CM | POA: Diagnosis not present

## 2018-08-05 DIAGNOSIS — E559 Vitamin D deficiency, unspecified: Secondary | ICD-10-CM | POA: Diagnosis not present

## 2018-08-05 DIAGNOSIS — E890 Postprocedural hypothyroidism: Secondary | ICD-10-CM | POA: Diagnosis not present

## 2018-08-05 DIAGNOSIS — M25562 Pain in left knee: Secondary | ICD-10-CM | POA: Diagnosis not present

## 2018-08-05 DIAGNOSIS — G8929 Other chronic pain: Secondary | ICD-10-CM | POA: Diagnosis not present

## 2018-08-25 DIAGNOSIS — H353131 Nonexudative age-related macular degeneration, bilateral, early dry stage: Secondary | ICD-10-CM | POA: Diagnosis not present

## 2018-08-27 ENCOUNTER — Other Ambulatory Visit: Payer: Self-pay | Admitting: Internal Medicine

## 2018-08-28 NOTE — Telephone Encounter (Signed)
Refilled: 06/30/2018 Last OV: 04/21/2018 Next OV: 09/03/2018

## 2018-08-29 NOTE — Telephone Encounter (Signed)
rx sent in for clonazepam #30 with one refill.  

## 2018-09-02 DIAGNOSIS — M79605 Pain in left leg: Secondary | ICD-10-CM | POA: Diagnosis not present

## 2018-09-02 DIAGNOSIS — M79604 Pain in right leg: Secondary | ICD-10-CM | POA: Diagnosis not present

## 2018-09-02 DIAGNOSIS — M25561 Pain in right knee: Secondary | ICD-10-CM | POA: Diagnosis not present

## 2018-09-03 ENCOUNTER — Other Ambulatory Visit: Payer: Self-pay

## 2018-09-03 ENCOUNTER — Encounter: Payer: Self-pay | Admitting: Internal Medicine

## 2018-09-03 ENCOUNTER — Ambulatory Visit (INDEPENDENT_AMBULATORY_CARE_PROVIDER_SITE_OTHER): Payer: Medicare Other | Admitting: Internal Medicine

## 2018-09-03 DIAGNOSIS — C73 Malignant neoplasm of thyroid gland: Secondary | ICD-10-CM | POA: Diagnosis not present

## 2018-09-03 DIAGNOSIS — I2510 Atherosclerotic heart disease of native coronary artery without angina pectoris: Secondary | ICD-10-CM

## 2018-09-03 DIAGNOSIS — F329 Major depressive disorder, single episode, unspecified: Secondary | ICD-10-CM | POA: Diagnosis not present

## 2018-09-03 DIAGNOSIS — I10 Essential (primary) hypertension: Secondary | ICD-10-CM | POA: Diagnosis not present

## 2018-09-03 DIAGNOSIS — D649 Anemia, unspecified: Secondary | ICD-10-CM

## 2018-09-03 DIAGNOSIS — F32A Depression, unspecified: Secondary | ICD-10-CM

## 2018-09-03 DIAGNOSIS — K219 Gastro-esophageal reflux disease without esophagitis: Secondary | ICD-10-CM

## 2018-09-03 DIAGNOSIS — I700 Atherosclerosis of aorta: Secondary | ICD-10-CM | POA: Diagnosis not present

## 2018-09-03 DIAGNOSIS — R739 Hyperglycemia, unspecified: Secondary | ICD-10-CM | POA: Diagnosis not present

## 2018-09-03 DIAGNOSIS — E7800 Pure hypercholesterolemia, unspecified: Secondary | ICD-10-CM

## 2018-09-03 NOTE — Progress Notes (Signed)
Patient ID: Tamara [name], female   DOB: 03-Mar-1927, 85 y.o.   MRN: [id number]   Virtual Visit via telephone Note  This visit type was conducted due to national recommendations for restrictions regarding the COVID-19 pandemic (e.g. social distancing).  This format is felt to be most appropriate for this patient at this time.  All issues noted in this document were discussed and addressed.  No physical exam was performed (except for noted visual exam findings with Video Visits).   I connected with Tamara [name] by telephone and verified that I am speaking with the correct person using two identifiers. Location patient: home Location provider: work Persons participating in the telephonevisit: patient, provider  I discussed the limitations, risks, security and privacy concerns of performing an evaluation and management service by telephone and the availability of in person appointments. The patient expressed understanding and agreed to proceed.   Reason for visit: scheduled follow up  HPI: She reports she is doing relatively well.  Her daughter moved in with her.  This is going well.  States she feels good.  Saw neurology 09/02/18.  On cymbalta.  Decreased gabapentin to '200mg'$  q hs.  I discussed with her regarding trying to decrease temazepam.  She is in agreement.  She is walking.  No chest pain.  No sob.  No acid reflux.  No abdominal pain.  Bowels moving.  She will schedule f/u with gyn.  She prefers to schedule.     ROS: See pertinent positives and negatives per HPI.  Past Medical History:  Diagnosis Date  . Anemia   . Arthritis   . Chicken pox   . Colitis   . Degenerative joint disease   . Diverticulitis   . Diverticulosis   . Fibrocystic breast disease   . GERD (gastroesophageal reflux disease)    EGD - gastritis/mild duodenitis, previous positive H. pylori  . Glaucoma   . Heart murmur   . Helicobacter pylori ab+   . History of depression   . History of skin cancer    facial   . Hypercholesterolemia   . Hypertension   . IBS (irritable bowel syndrome)   . Internal hemorrhoids   . Migraines   . Osteopenia   . Stroke ([place])   . Thyroid disease   . Tubular adenoma of colon   . Vitamin D deficiency     Past Surgical History:  Procedure Laterality Date  . ABDOMINAL HYSTERECTOMY  1973   secondary to bleeding  . APPENDECTOMY    . BILATERAL SALPINGOOPHORECTOMY  2004   complex ovarian cyst (left) - benign  . BREAST BIOPSY Bilateral    x4- neg  . MANDIBLE FRACTURE SURGERY    . VENTRAL HERNIA REPAIR  2005   Dr. Tamala Julian    Family History  Problem Relation Age of Onset  . Esophageal cancer Mother   . Stomach cancer Mother   . Heart disease Father   . Cirrhosis Brother   . Parkinson's disease Brother   . Ovarian cancer Sister   . Colon cancer Sister        half-sister  . Migraines Daughter   . Heart attack Son   . Breast cancer Neg Hx     SOCIAL HX: reviewed.    Current Outpatient Medications:  .  gabapentin (NEURONTIN) 100 MG capsule, Take 100 mg by mouth 2 (two) times daily., Disp: , Rfl:  .  Vitamin D, Ergocalciferol, (DRISDOL) 1.25 MG (50000 UT) CAPS capsule, Take by mouth., Disp: ,  Rfl:  .  acetaminophen (TYLENOL) 325 MG tablet, Take 650 mg by mouth every 6 (six) hours as needed for mild pain or fever. , Disp: , Rfl:  .  Apple Cider Vinegar 500 MG TABS, Take 500 mg by mouth daily., Disp: , Rfl:  .  aspirin 81 MG tablet, Take 81 mg by mouth daily., Disp: , Rfl:  .  clopidogrel (PLAVIX) 75 MG tablet, Take 1 tablet (75 mg total) by mouth daily., Disp: 90 tablet, Rfl: 3 .  DULoxetine (CYMBALTA) 20 MG capsule, Take 20 mg by mouth daily as needed., Disp: , Rfl:  .  levothyroxine (SYNTHROID, LEVOTHROID) 75 MCG tablet, Take 75 mcg by mouth daily., Disp: , Rfl:  .  losartan (COZAAR) 50 MG tablet, TAKE 1 TABLET EVERY DAY, Disp: 90 tablet, Rfl: 1 .  PARoxetine (PAXIL) 30 MG tablet, TAKE 1 TABLET EVERY DAY, Disp: 90 tablet, Rfl: 1 .  polyethylene glycol  (MIRALAX / GLYCOLAX) packet, Take 17 g by mouth daily as needed for mild constipation., Disp: , Rfl:  .  rosuvastatin (CRESTOR) 40 MG tablet, TAKE 1 TABLET (40 MG TOTAL) BY MOUTH DAILY., Disp: 90 tablet, Rfl: 2 .  temazepam (RESTORIL) 7.5 MG capsule, Take 1 capsule (7.5 mg total) by mouth at bedtime as needed for sleep., Disp: 30 capsule, Rfl: 0  EXAM:  GENERAL: alert.  Sounds to be in no acute distress.  Answering questions appropriately.    PSYCH/NEURO: pleasant and cooperative, no obvious depression or anxiety, speech and thought processing grossly intact  ASSESSMENT AND PLAN:  Discussed the following assessment and plan:  Aortic atherosclerosis ([place]) On crestor.    CAD (coronary artery disease) Currently asymptomatic.    Depression Started on cymbalta by neurology.  May be able to taper paxil.  Decrease temazepam to 7.'5mg'$  q day.  Follow.    GERD (gastroesophageal reflux disease) No upper symptoms currently.  Follow.    Hypercholesterolemia On crestor. Low cholesterol diet and exercise.  Follow lipid panel and liver function tests.    Hyperglycemia Low carb diet and exercise.  Follow met b and a1c.    Hypertension Blood pressure has been under good control.  Continue same medication regimen.  Follow pressures.  Follow metabolic panel.    Thyroid cancer ([place]) S/p thyroidectomy - papillary carcinoma.  On thyroid replacement.  Follow tsh.  Followed by Dr Gabriel Carina.    Anemia Last hemoglobin decreased.  Recheck cbc, iron studies and B12.      I discussed the assessment and treatment plan with the patient. The patient was provided an opportunity to ask questions and all were answered. The patient agreed with the plan and demonstrated an understanding of the instructions.   The patient was advised to call back or seek an in-person evaluation if the symptoms worsen or if the condition fails to improve as anticipated.  I provided 15 minutes of non-face-to-face time during this  encounter.   Einar Pheasant, MD

## 2018-09-06 ENCOUNTER — Encounter: Payer: Self-pay | Admitting: Internal Medicine

## 2018-09-06 DIAGNOSIS — D649 Anemia, unspecified: Secondary | ICD-10-CM | POA: Insufficient documentation

## 2018-09-06 MED ORDER — TEMAZEPAM 7.5 MG PO CAPS: 7.5000 mg | ORAL_CAPSULE | Freq: Every evening | ORAL | 0 refills | Status: DC | PRN

## 2018-09-06 NOTE — Assessment & Plan Note (Signed)
On crestor.   

## 2018-09-06 NOTE — Assessment & Plan Note (Signed)
S/p thyroidectomy - papillary carcinoma.  On thyroid replacement.  Follow tsh.  Followed by Dr Gabriel Carina.

## 2018-09-06 NOTE — Assessment & Plan Note (Signed)
On crestor.  Low cholesterol diet and exercise.  Follow lipid panel and liver function tests.   

## 2018-09-06 NOTE — Assessment & Plan Note (Signed)
Currently asymptomatic 

## 2018-09-06 NOTE — Assessment & Plan Note (Signed)
Low carb diet and exercise.  Follow met b and a1c.   

## 2018-09-06 NOTE — Assessment & Plan Note (Signed)
No upper symptoms currently.  Follow.

## 2018-09-06 NOTE — Assessment & Plan Note (Signed)
Started on cymbalta by neurology.  May be able to taper paxil.  Decrease temazepam to 7.5mg  q day.  Follow.

## 2018-09-06 NOTE — Assessment & Plan Note (Signed)
Last hemoglobin decreased.  Recheck cbc, iron studies and B12.

## 2018-09-06 NOTE — Assessment & Plan Note (Signed)
Blood pressure has been under good control.  Continue same medication regimen.  Follow pressures.  Follow metabolic panel.   

## 2018-09-18 ENCOUNTER — Other Ambulatory Visit: Payer: Self-pay | Admitting: Cardiovascular Disease

## 2018-09-21 NOTE — Telephone Encounter (Signed)
Please schedule overdue F/U with Dr. Gollan. Thank you! 

## 2018-09-22 NOTE — Telephone Encounter (Signed)
Patient is already scheduled on 9/29 with [facility]

## 2018-09-23 ENCOUNTER — Other Ambulatory Visit: Payer: Self-pay | Admitting: Internal Medicine

## 2018-10-01 ENCOUNTER — Other Ambulatory Visit: Payer: Self-pay | Admitting: Internal Medicine

## 2018-10-06 NOTE — Telephone Encounter (Signed)
rx ok'd for temazepam #30with one refill.

## 2018-10-06 NOTE — Telephone Encounter (Signed)
Last OV 09/03/2018 Next OV none Last refill 09/06/2018

## 2018-10-24 NOTE — Progress Notes (Signed)
Cardiology Office Note  Date:  10/27/2018   ID:  Tamara [name], DOB 27-Sep-1927, MRN [id number]  PCP:  Einar Pheasant, MD   Chief Complaint  Patient presents with  . Other    12 month follow up. Patient c/o swelling in legs and ankles. Meds reviewed verbally with patient.     HPI:  Tamara [name] is a very pleasant 85 year old woman with Hyperlipidemia, hypertension,   Previous Stroke August 2014 , could not write,right hand, problem with numbers Mild carotid plaque bilaterally Depression mild to moderate diffuse aorta and coronary plaquing/atherosclerosis on CT CAD on CT scan She presents today for follow-up of her blood pressure and history of stroke  In follow-up she presents with her daughter  Neuropathy in her legs Started on Lyrica, better sx Thinks that she can wean off the gabapentin  Vision problem, trouble driving Does groceries Walking more with dog walking Feels that she is getting in good shape, losing weight  Taking care of daughter, lung surgery   chronic right knee pain  declined surgery for her knee   thyroidectomy Developed effusion post surgery, had this drained  Total chol 179,  LDL 90 On crestor 40 mg daily  EKG personally reviewed by myself on todays visit Shows Normal sinus rhythm with rate 62 bpm right bundle branch block  Other past medical history reviewed History of diverticulitis, went to the emergency room twice, was given Cipro and Flagyl Family reports that she had a reaction to the Flagy  CT scan  showed mild to moderate diffuse aorta and coronary plaquing/atherosclerosis.   admitted to the hospital August 25 and discharged 09/22/2012. Diagnosis of embolic infarct in the left parietal area with right hand paresthesias and expressive aphasia.  no documented atrial fibrillation though significant concern given history of fluttering and palpitations.   30 day Holter did not show any significant arrhythmia  residual deficits have  returned to normal.  On aspirin and Plavix with no further symptoms  Echocardiogram 09/22/2012 shows ejection fraction 55-60% mild mild MR and TR, normal right ventricular systolic pressures bubble study was not performed to look for PFO EKG in the hospital showed normal sinus rhythm with rate 61 beats per minute, essentially normal MRI of the brain showed abnormal signal in the left high posterior parietal region consistent with acute ischemia Carotid ultrasound showed no hemodynamic significance. There is calcified mural plaque identified on the right and left, less than 50%   PMH:   has a past medical history of Anemia, Arthritis, Chicken pox, Colitis, Degenerative joint disease, Diverticulitis, Diverticulosis, Fibrocystic breast disease, GERD (gastroesophageal reflux disease), Glaucoma, Heart murmur, Helicobacter pylori ab+, History of depression, History of skin cancer, Hypercholesterolemia, Hypertension, IBS (irritable bowel syndrome), Internal hemorrhoids, Migraines, Osteopenia, Stroke ([place]), Thyroid disease, Tubular adenoma of colon, and Vitamin D deficiency.  PSH:    Past Surgical History:  Procedure Laterality Date  . ABDOMINAL HYSTERECTOMY  1973   secondary to bleeding  . APPENDECTOMY    . BILATERAL SALPINGOOPHORECTOMY  2004   complex ovarian cyst (left) - benign  . BREAST BIOPSY Bilateral    x4- neg  . MANDIBLE FRACTURE SURGERY    . VENTRAL HERNIA REPAIR  2005   Dr. Tamala Julian    Current Outpatient Medications  Medication Sig Dispense Refill  . Apple Cider Vinegar 500 MG TABS Take 500 mg by mouth daily.    [name] aspirin 81 MG tablet Take 81 mg by mouth daily.    . clopidogrel (PLAVIX) 75 MG tablet  TAKE 1 TABLET (75 MG TOTAL) BY MOUTH DAILY. 90 tablet 0  . levothyroxine (SYNTHROID, LEVOTHROID) 75 MCG tablet Take 75 mcg by mouth daily.    [name] losartan (COZAAR) 50 MG tablet TAKE 1 TABLET EVERY DAY 90 tablet 1  . PARoxetine (PAXIL) 30 MG tablet TAKE 1 TABLET EVERY DAY 90 tablet 1  .  polyethylene glycol (MIRALAX / GLYCOLAX) packet Take 17 g by mouth daily as needed for mild constipation.    . rosuvastatin (CRESTOR) 40 MG tablet TAKE 1 TABLET (40 MG TOTAL) BY MOUTH DAILY. 90 tablet 2  . temazepam (RESTORIL) 15 MG capsule TAKE 1 CAPSULE BY MOUTH AT BEDTIME 30 capsule 1  . temazepam (RESTORIL) 7.5 MG capsule Take 1 capsule (7.5 mg total) by mouth at bedtime as needed for sleep. 30 capsule 0  . gabapentin (NEURONTIN) 100 MG capsule Take 100 mg by mouth 2 (two) times daily.     No current facility-administered medications for this visit.      Allergies:   Flagyl [metronidazole], Omnicef [cefdinir], Other, Prednisone, Shrimp [shellfish allergy], Tylenol [acetaminophen], Vicodin [hydrocodone-acetaminophen], and Ibuprofen   Social History:  The patient  reports that she has never smoked. She has never used smokeless tobacco. She reports that she does not drink alcohol or use drugs.   Family History:   family history includes Cirrhosis in her brother; Colon cancer in her sister; Esophageal cancer in her mother; Heart attack in her son; Heart disease in her father; Migraines in her daughter; Ovarian cancer in her sister; Parkinson's disease in her brother; Stomach cancer in her mother.    Review of Systems: Review of Systems  Constitutional: Negative.   HENT: Negative.   Respiratory: Negative.   Cardiovascular: Negative.   Gastrointestinal: Negative.   Musculoskeletal: Negative.   Neurological: Negative.   Psychiatric/Behavioral: Negative.   All other systems reviewed and are negative.   PHYSICAL EXAM: VS:  BP 132/60 (BP Location: Left Arm, Patient Position: Sitting, Cuff Size: Normal)   Pulse 62   Ht 5\' 3"  (1.6 m)   Wt 136 lb 4 oz (61.8 kg)   LMP 01/17/1972   BMI 24.14 kg/m  , BMI Body mass index is 24.14 kg/m. Constitutional:  oriented to person, place, and time. No distress.  HENT:  Head: Grossly normal Eyes:  no discharge. No scleral icterus.  Neck: No JVD,  no carotid bruits  Cardiovascular: Regular rate and rhythm, 2/6 SEB lsb Pulmonary/Chest: Clear to auscultation bilaterally, no wheezes or rails Abdominal: Soft.  no distension.  no tenderness.  Musculoskeletal: Normal range of motion Neurological:  normal muscle tone. Coordination normal. No atrophy Skin: Skin warm and dry Psychiatric: normal affect, pleasant   Recent Labs: 04/30/2018: ALT 23; BUN 13; Creatinine, Ser 0.81; Hemoglobin 10.4; Platelets 260; Potassium 3.5; Sodium 137    Lipid Panel Lab Results  Component Value Date   CHOL 190 04/13/2018   HDL 74.00 04/13/2018   LDLCALC 98 04/13/2018   TRIG 90.0 04/13/2018     Wt Readings from Last 3 Encounters:  10/27/18 136 lb 4 oz (61.8 kg)  04/21/18 143 lb (64.9 kg)  04/16/18 142 lb 12.8 oz (64.8 kg)     ASSESSMENT AND PLAN:  Essential hypertension Blood pressure stable, no changes to her medications  Hypercholesterolemia Recent weight loss, continue current medications   Cerebrovascular accident (CVA), unspecified mechanism ([place]) tolerating aspirin and Plavix  Prior stroke details discussed with her , back in 2014  No recent TIA or stroke symptoms  Bilateral  carotid artery stenosis Smooth plaque bilaterally, less than 39%  Last evaluated May 2017  Aortic atherosclerosis ([place]) no need for routine surveillance Continue aggressive lipid management  Coronary artery disease involving native coronary artery of native heart without angina pectoris Doing lots of walking with no anginal symptoms No further work-up at this time Continue to work on cholesterol  Thyroidectomy History of thyroid cancer Recovered well   Total encounter time more than 25 minutes  Greater than 50% was spent in counseling and coordination of care with the patient   Signed, Esmond Plants, M.D., Ph.D. 10/27/2018  [place], [place]

## 2018-10-27 ENCOUNTER — Other Ambulatory Visit: Payer: Self-pay

## 2018-10-27 ENCOUNTER — Ambulatory Visit (INDEPENDENT_AMBULATORY_CARE_PROVIDER_SITE_OTHER): Payer: Medicare Other | Admitting: Cardiovascular Disease

## 2018-10-27 ENCOUNTER — Encounter: Payer: Self-pay | Admitting: Cardiovascular Disease

## 2018-10-27 VITALS — BP 132/60 | HR 62 | Ht 63.0 in | Wt 136.2 lb

## 2018-10-27 DIAGNOSIS — I6523 Occlusion and stenosis of bilateral carotid arteries: Secondary | ICD-10-CM

## 2018-10-27 DIAGNOSIS — R0602 Shortness of breath: Secondary | ICD-10-CM

## 2018-10-27 DIAGNOSIS — I639 Cerebral infarction, unspecified: Secondary | ICD-10-CM | POA: Diagnosis not present

## 2018-10-27 DIAGNOSIS — I2510 Atherosclerotic heart disease of native coronary artery without angina pectoris: Secondary | ICD-10-CM

## 2018-10-27 DIAGNOSIS — I10 Essential (primary) hypertension: Secondary | ICD-10-CM

## 2018-10-27 DIAGNOSIS — I700 Atherosclerosis of aorta: Secondary | ICD-10-CM

## 2018-10-27 DIAGNOSIS — E7800 Pure hypercholesterolemia, unspecified: Secondary | ICD-10-CM | POA: Diagnosis not present

## 2018-10-27 NOTE — Patient Instructions (Signed)

## 2018-11-11 DIAGNOSIS — E890 Postprocedural hypothyroidism: Secondary | ICD-10-CM | POA: Diagnosis not present

## 2018-11-11 DIAGNOSIS — E559 Vitamin D deficiency, unspecified: Secondary | ICD-10-CM | POA: Diagnosis not present

## 2018-11-11 DIAGNOSIS — Z85850 Personal history of malignant neoplasm of thyroid: Secondary | ICD-10-CM | POA: Diagnosis not present

## 2018-11-13 DIAGNOSIS — M792 Neuralgia and neuritis, unspecified: Secondary | ICD-10-CM | POA: Diagnosis not present

## 2018-11-16 DIAGNOSIS — Z85850 Personal history of malignant neoplasm of thyroid: Secondary | ICD-10-CM | POA: Diagnosis not present

## 2018-11-16 DIAGNOSIS — E559 Vitamin D deficiency, unspecified: Secondary | ICD-10-CM | POA: Diagnosis not present

## 2018-11-16 DIAGNOSIS — E890 Postprocedural hypothyroidism: Secondary | ICD-10-CM | POA: Diagnosis not present

## 2018-11-18 ENCOUNTER — Other Ambulatory Visit: Payer: Self-pay | Admitting: Internal Medicine

## 2018-12-03 ENCOUNTER — Other Ambulatory Visit: Payer: Self-pay | Admitting: Internal Medicine

## 2018-12-04 ENCOUNTER — Other Ambulatory Visit: Payer: Self-pay | Admitting: Cardiovascular Disease

## 2018-12-04 NOTE — Telephone Encounter (Signed)
rx refilled for temazepam #30 with no refills.  Needs f/u appt.

## 2018-12-16 ENCOUNTER — Emergency Department: Payer: Medicare Other

## 2018-12-16 ENCOUNTER — Emergency Department
Admission: EM | Admit: 2018-12-16 | Discharge: 2018-12-16 | Disposition: A | Discharge status: Home / Self Care | Payer: Medicare Other | Attending: Emergency Medicine | Admitting: Emergency Medicine

## 2018-12-16 ENCOUNTER — Other Ambulatory Visit: Payer: Self-pay

## 2018-12-16 DIAGNOSIS — I2510 Atherosclerotic heart disease of native coronary artery without angina pectoris: Secondary | ICD-10-CM | POA: Insufficient documentation

## 2018-12-16 DIAGNOSIS — Z79899 Other long term (current) drug therapy: Secondary | ICD-10-CM | POA: Insufficient documentation

## 2018-12-16 DIAGNOSIS — M542 Cervicalgia: Secondary | ICD-10-CM | POA: Insufficient documentation

## 2018-12-16 DIAGNOSIS — Z7982 Long term (current) use of aspirin: Secondary | ICD-10-CM | POA: Diagnosis not present

## 2018-12-16 DIAGNOSIS — R42 Dizziness and giddiness: Secondary | ICD-10-CM | POA: Diagnosis not present

## 2018-12-16 DIAGNOSIS — Z7902 Long term (current) use of antithrombotics/antiplatelets: Secondary | ICD-10-CM | POA: Insufficient documentation

## 2018-12-16 DIAGNOSIS — I10 Essential (primary) hypertension: Secondary | ICD-10-CM | POA: Insufficient documentation

## 2018-12-16 DIAGNOSIS — Z8673 Personal history of transient ischemic attack (TIA), and cerebral infarction without residual deficits: Secondary | ICD-10-CM | POA: Diagnosis not present

## 2018-12-16 DIAGNOSIS — R4781 Slurred speech: Secondary | ICD-10-CM | POA: Diagnosis not present

## 2018-12-16 DIAGNOSIS — R52 Pain, unspecified: Secondary | ICD-10-CM | POA: Diagnosis not present

## 2018-12-16 LAB — TROPONIN I (HIGH SENSITIVITY)
Troponin I (High Sensitivity): 4 ng/L (ref <18)
Troponin I (High Sensitivity): 7 ng/L (ref <18)

## 2018-12-16 LAB — COMPREHENSIVE METABOLIC PANEL
ALT: 10 U/L (ref 0–44)
AST: 22 U/L (ref 15–41)
Albumin: 3.7 g/dL (ref 3.5–5.0)
Alkaline Phosphatase: 60 U/L (ref 38–126)
Anion gap: 15 (ref 5–15)
BUN: 16 mg/dL (ref 8–23)
CO2: 24 mmol/L (ref 22–32)
Calcium: 8.4 mg/dL — ABNORMAL LOW (ref 8.9–10.3)
Chloride: 98 mmol/L (ref 98–111)
Creatinine, Ser: 0.76 mg/dL (ref 0.44–1.00)
GFR calc Af Amer: >60 mL/min (ref >60)
GFR calc non Af Amer: >60 mL/min (ref >60)
Glucose, Bld: 131 mg/dL — ABNORMAL HIGH (ref 70–99)
Potassium: 4 mmol/L (ref 3.5–5.1)
Sodium: 137 mmol/L (ref 135–145)
Total Bilirubin: 0.6 mg/dL (ref 0.3–1.2)
Total Protein: 6.7 g/dL (ref 6.5–8.1)

## 2018-12-16 LAB — CBC WITH DIFFERENTIAL/PLATELET
Abs Immature Granulocytes: 0 10*3/uL (ref 0.00–0.07)
Basophils Absolute: 0 10*3/uL (ref 0.0–0.1)
Basophils Relative: 1 %
Eosinophils Absolute: 0.1 10*3/uL (ref 0.0–0.5)
Eosinophils Relative: 1 %
HCT: 32.7 % — ABNORMAL LOW (ref 36.0–46.0)
Hemoglobin: 11 g/dL — ABNORMAL LOW (ref 12.0–15.0)
Immature Granulocytes: 0 %
Lymphocytes Relative: 24 %
Lymphs Abs: 1.2 10*3/uL (ref 0.7–4.0)
MCH: 28.3 pg (ref 26.0–34.0)
MCHC: 33.6 g/dL (ref 30.0–36.0)
MCV: 84.1 fL (ref 80.0–100.0)
Monocytes Absolute: 0.4 10*3/uL (ref 0.1–1.0)
Monocytes Relative: 7 %
Neutro Abs: 3.5 10*3/uL (ref 1.7–7.7)
Neutrophils Relative %: 67 %
Platelets: 268 10*3/uL (ref 150–400)
RBC: 3.89 MIL/uL (ref 3.87–5.11)
RDW: 14.4 % (ref 11.5–15.5)
WBC: 5.1 10*3/uL (ref 4.0–10.5)
nRBC: 0 % (ref 0.0–0.2)

## 2018-12-16 MED ORDER — MECLIZINE HCL 25 MG PO TABS
25.0000 mg | ORAL_TABLET | Freq: Once | ORAL | Status: AC
  Administered 2018-12-16: 25 mg via ORAL
  Filled 2018-12-16: qty 1

## 2018-12-16 MED ORDER — KETOROLAC TROMETHAMINE 30 MG/ML IJ SOLN
15.0000 mg | Freq: Once | INTRAMUSCULAR | Status: AC
  Administered 2018-12-16: 16:00:00 15 mg via INTRAVENOUS
  Filled 2018-12-16: qty 1

## 2018-12-16 MED ORDER — SODIUM CHLORIDE 0.9 % IV BOLUS
500.0000 mL | Freq: Once | INTRAVENOUS | Status: AC
  Administered 2018-12-16: 500 mL via INTRAVENOUS

## 2018-12-16 MED ORDER — ONDANSETRON HCL 4 MG/2ML IJ SOLN
4.0000 mg | Freq: Once | INTRAMUSCULAR | Status: AC
  Administered 2018-12-16: 4 mg via INTRAVENOUS
  Filled 2018-12-16: qty 2

## 2018-12-16 MED ORDER — MORPHINE SULFATE (PF) 2 MG/ML IV SOLN
2.0000 mg | Freq: Once | INTRAVENOUS | Status: AC
  Administered 2018-12-16: 2 mg via INTRAVENOUS
  Filled 2018-12-16: qty 1

## 2018-12-16 MED ORDER — IOHEXOL 350 MG/ML SOLN
75.0000 mL | Freq: Once | INTRAVENOUS | Status: AC | PRN
  Administered 2018-12-16: 75 mL via INTRAVENOUS

## 2018-12-16 NOTE — ED Notes (Signed)
Pt placed on bedpan. Peri-care performed and pt repositioned in bed. This RN sent urine specimen to lab if needed.

## 2018-12-16 NOTE — ED Notes (Signed)
Resumed care from annie rn.  Pt alert   Family with pt.  md at bedside.

## 2018-12-16 NOTE — ED Provider Notes (Signed)
Procedures  Clinical Course as of Dec 15 1716  Wed Dec 16, 2018  1518 Hemoglobin(!): 11.0 [SS]    Clinical Course User Index [SS] Arta Silence, MD    ----------------------------------------- 5:18 PM on 12/16/2018 -----------------------------------------  Labs all okay, second troponin stable.  Patient feels better.  CT angiogram discussed with Dr. Cari Caraway who feels that this is an incidental finding, not clinically significant, and due to patient's age would not be a candidate for any intervention.  He will plan to follow-up with her outpatient and she can follow-up with her doctors as well.  Patient and daughter are agreeable with this plan and eager to go home.  She will continue to take her blood pressure medicine at home to manage her chronic hypertension.   Carrie Mew, MD 12/16/18 1719

## 2018-12-16 NOTE — ED Triage Notes (Signed)
Pt comes EMS with neck pain. Sudden onset of neck pain and dizziness at 12pm today. Stroke screen with EMS was negative. Pt also nausea but has not vomitted. AOx4. Pain goes towards right side of neck. LKW at 830 last night.

## 2018-12-16 NOTE — Discharge Instructions (Addendum)
Your labs and CT scan today were all okay. Please follow up with your doctors to continue monitoring your symptoms.

## 2018-12-16 NOTE — ED Notes (Signed)
Pt up to bathroom with assistance   Family with pt.  Iv in place.  Pt alert   Speech clear.

## 2018-12-16 NOTE — ED Provider Notes (Signed)
[facility] Emergency Department Provider Note ____________________________________________   First MD Initiated Contact with Patient 12/16/18 1323     (approximate)  I have reviewed the triage vital signs and the nursing notes.   HISTORY  Chief Complaint Neck Pain    HPI Tamara [name] is a 85 y.o. female with PMH as noted below including prior stroke history who presents with acute onset of neck pain a few hours ago, initially across the width of her neck but now more in the midline.  It is worse when she turns her head, but she is able to do so without difficulty.  After the neck pain, the patient started to develop dizziness which she describes as spinning or vertigo.  She also has had nausea but no vomiting.  She reports a dull headache.  Patient denies any trauma or injury to the neck.  She has no numbness or weakness, and no vision changes or difficulty speaking.  Past Medical History:  Diagnosis Date  . Anemia   . Arthritis   . Chicken pox   . Colitis   . Degenerative joint disease   . Diverticulitis   . Diverticulosis   . Fibrocystic breast disease   . GERD (gastroesophageal reflux disease)    EGD - gastritis/mild duodenitis, previous positive H. pylori  . Glaucoma   . Heart murmur   . Helicobacter pylori ab+   . History of depression   . History of skin cancer    facial  . Hypercholesterolemia   . Hypertension   . IBS (irritable bowel syndrome)   . Internal hemorrhoids   . Migraines   . Osteopenia   . Stroke ([place])   . Thyroid disease   . Tubular adenoma of colon   . Vitamin D deficiency     Patient Active Problem List   Diagnosis Date Noted  . Anemia 09/06/2018  . UTI (urinary tract infection) 04/22/2018  . Labial lesion 04/18/2018  . Leg pain 04/18/2018  . Recurrent UTI 01/25/2017  . Thyroid cancer ([place]) 08/07/2016  . Right arm numbness 05/31/2015  . Aortic atherosclerosis ([place]) 05/15/2015  . CAD (coronary artery disease)  05/15/2015  . Headache, migraine 05/09/2015  . History of colon polyps 05/09/2015  . Arthritis, degenerative 05/09/2015  . Abdominal pain 04/25/2015  . Diarrhea 04/15/2015  . Right knee pain 02/13/2015  . Hyperglycemia 08/11/2014  . Murmur, heart 05/17/2014  . Hyperlipidemia 05/17/2014  . Health care maintenance 04/17/2014  . Carotid stenosis 11/15/2013  . Dysphagia 10/28/2013  . Back pain 06/06/2013  . Internal nasal lesion 06/06/2013  . Fatigue 05/09/2013  . Fluttering heart 10/12/2012  . History of CVA (cerebrovascular accident) 09/28/2012  . Left carotid bruit 01/17/2012  . Osteopenia 01/17/2012  . GERD (gastroesophageal reflux disease) 01/17/2012  . Depression 01/17/2012  . Hypertension 12/18/2011  . Hypercholesterolemia 12/18/2011    Past Surgical History:  Procedure Laterality Date  . ABDOMINAL HYSTERECTOMY  1973   secondary to bleeding  . APPENDECTOMY    . BILATERAL SALPINGOOPHORECTOMY  2004   complex ovarian cyst (left) - benign  . BREAST BIOPSY Bilateral    x4- neg  . MANDIBLE FRACTURE SURGERY    . VENTRAL HERNIA REPAIR  2005   Dr. Tamala Julian    Prior to Admission medications   Medication Sig Start Date End Date Taking? Authorizing Provider  Apple Cider Vinegar 500 MG TABS Take 500 mg by mouth daily.    [provider]  aspirin 81 MG tablet  Take 81 mg by mouth daily.    [provider]  clopidogrel (PLAVIX) 75 MG tablet TAKE 1 TABLET (75 MG TOTAL) BY MOUTH DAILY. 12/07/18   Minna Merritts, MD  gabapentin (NEURONTIN) 100 MG capsule Take 100 mg by mouth 2 (two) times daily. 09/02/18 10/02/18  [provider]  levothyroxine (SYNTHROID, LEVOTHROID) 75 MCG tablet Take 75 mcg by mouth daily. 12/03/17   [provider]  losartan (COZAAR) 50 MG tablet TAKE 1 TABLET EVERY DAY 11/19/18   Einar Pheasant, MD  PARoxetine (PAXIL) 30 MG tablet TAKE 1 TABLET EVERY DAY 09/23/18   Einar Pheasant, MD  polyethylene glycol (MIRALAX / GLYCOLAX) packet  Take 17 g by mouth daily as needed for mild constipation.    [provider]  rosuvastatin (CRESTOR) 40 MG tablet TAKE 1 TABLET (40 MG TOTAL) BY MOUTH DAILY. 07/27/18   Einar Pheasant, MD  temazepam (RESTORIL) 15 MG capsule Take 1 capsule (15 mg total) by mouth at bedtime as needed for sleep. 12/04/18   Einar Pheasant, MD  temazepam (RESTORIL) 7.5 MG capsule Take 1 capsule (7.5 mg total) by mouth at bedtime as needed for sleep. 09/06/18   Einar Pheasant, MD    Allergies Flagyl [metronidazole], Omnicef [cefdinir], Other, Prednisone, Shrimp [shellfish allergy], Tylenol [acetaminophen], Vicodin [hydrocodone-acetaminophen], and Ibuprofen  Family History  Problem Relation Age of Onset  . Esophageal cancer Mother   . Stomach cancer Mother   . Heart disease Father   . Cirrhosis Brother   . Parkinson's disease Brother   . Ovarian cancer Sister   . Colon cancer Sister        half-sister  . Migraines Daughter   . Heart attack Son   . Breast cancer Neg Hx     Social History Social History   Tobacco Use  . Smoking status: Never Smoker  . Smokeless tobacco: Never Used  Substance Use Topics  . Alcohol use: No    Alcohol/week: 0.0 standard drinks  . Drug use: No    Review of Systems  Constitutional: No fever. Eyes: No visual changes. ENT: No sore throat.  Positive for neck pain. Cardiovascular: Denies chest pain. Respiratory: Denies shortness of breath. Gastrointestinal: Positive for nausea. Genitourinary: Negative for dysuria.  Musculoskeletal: Negative for back pain. Skin: Negative for rash. Neurological: Negative for focal weakness or numbness.   ____________________________________________   PHYSICAL EXAM:  VITAL SIGNS: ED Triage Vitals  Enc Vitals Group     BP 12/16/18 1256 (!) 189/70     Pulse Rate 12/16/18 1256 68     Resp 12/16/18 1256 17     Temp 12/16/18 1256 98.7 F (37.1 C)     Temp Source 12/16/18 1256 Oral     SpO2 12/16/18 1256 99 %     Weight  12/16/18 1257 137 lb (62.1 kg)     Height 12/16/18 1257 5\' 3"  (1.6 m)     Head Circumference --      Peak Flow --      Pain Score 12/16/18 1257 6     Pain Loc --      Pain Edu? --      Excl. in [place]? --     Constitutional: Alert and oriented.  Uncomfortable appearing but in no acute distress. Eyes: Conjunctivae are normal.  EOMI.  PERRLA.  No nystagmus or photophobia. Head: Atraumatic. Nose: No congestion/rhinnorhea. Mouth/Throat: Mucous membranes are moist.   Neck: Normal range of motion.  No midline tenderness.  Mild bilateral paraspinal tenderness  with no step-off, crepitus, swelling, or induration. Cardiovascular: Normal rate, regular rhythm. Good peripheral circulation. Respiratory: Normal respiratory effort.  No retractions.  Gastrointestinal: Soft and nontender. No distention.  Genitourinary: No flank tenderness. Musculoskeletal: Extremities warm and well perfused.  Neurologic:  Normal speech and language.  No facial droop.  Motor and sensory intact in all extremities.  No pronator drift.  No ataxia.  Skin:  Skin is warm and dry. No rash noted. Psychiatric: Mood and affect are normal. Speech and behavior are normal.  ____________________________________________   LABS (all labs ordered are listed, but only abnormal results are displayed)  Labs Reviewed  CBC WITH DIFFERENTIAL/PLATELET - Abnormal; Notable for the following components:      Result Value   Hemoglobin 11.0 (*)    HCT 32.7 (*)    All other components within normal limits  COMPREHENSIVE METABOLIC PANEL - Abnormal; Notable for the following components:   Glucose, Bld 131 (*)    Calcium 8.4 (*)    All other components within normal limits  TROPONIN I (HIGH SENSITIVITY)  TROPONIN I (HIGH SENSITIVITY)   ____________________________________________  EKG  ED ECG REPORT I, Arta Silence, the attending physician, personally viewed and interpreted this ECG.  Date: 12/16/2018 EKG Time: 1300 Rate: 71  Rhythm: normal sinus rhythm QRS Axis: normal Intervals: RSR' ST/T Wave abnormalities: Nonspecific ST abnormalities Narrative Interpretation: no evidence of acute ischemia; no significant change when compared to EKG of 10/27/2018  ____________________________________________  RADIOLOGY  CT head: No acute abnormality CT cervical spine: Degenerative changes with no acute fracture CT angiogram head/neck: 2 mm supraclinoid left ICA aneurysm  ____________________________________________   PROCEDURES  Procedure(s) performed: No  Procedures  Critical Care performed: No ____________________________________________   INITIAL IMPRESSION / ASSESSMENT AND PLAN / ED COURSE  Pertinent labs & imaging results that were available during my care of the patient were reviewed by me and considered in my medical decision making (see chart for details).  85 year old female with PMH as noted above presents with acute onset of posterior neck pain, subsequently developing dizziness which is described as spinning or vertigo.  The patient has had vertigo in the past, but never as severely as she is now.  I reviewed the past medical records in [place].  The patient was most recently seen in the ED in April for fever and myalgias.  She has no recent ED visits or admissions for vertigo or strokelike symptoms.  She has no history of vascular disease.  On exam, the patient is uncomfortable but relatively well-appearing.  She is hypertensive with otherwise normal vital signs.  The neurologic exam is nonfocal.  She has pain to the neck, but no palpable or cutaneous findings, and she is able to range it without difficulty.  Overall I suspect most likely benign etiology of the neck pain such as muscle strain or spasm, and the vertigo is most consistent with peripheral etiology as it is relatively severe, acute in onset, and associated with nausea.  Initial CT head obtained from triage is negative.  I have a low  suspicion for acute stroke, but given the relatively acute onset of the symptoms, the presence of neck pain with a neurologic symptom at the same time, as well as the hypertension, I will obtain a CT angiogram of the head and neck to rule out acute findings, as well as a CT of the C-spine as the patient has a prior history of a cervical compression fracture.  We will give symptomatic treatment for the  vertigo.  If the patient's symptoms resolve and the imaging is negative, I anticipate possible discharge home.  ----------------------------------------- 3:31 PM on 12/16/2018 -----------------------------------------  The patient's dizziness is much better.  Her neck pain is improved although still present.  The CT C-spine is negative for acute findings.  The angiogram shows a small supraclinoid aneurysm of the left ICA.  I doubt that this is the etiology of the patient's symptoms.  She has no evidence of acute hemorrhage.  I have paged Dr. Izora Ribas from neurosurgery who is currently in the operating room.  I have ordered some additional pain medication and we with the repeat troponin.  I am signing the patient out to the oncoming physician Dr. Joni Fears. ____________________________________________   FINAL CLINICAL IMPRESSION(S) / ED DIAGNOSES  Final diagnoses:  Neck pain  Vertigo      NEW MEDICATIONS STARTED DURING THIS VISIT:  New Prescriptions   No medications on file     Note:  This document was prepared using Dragon voice recognition software and may include unintentional dictation errors.    Arta Silence, MD 12/16/18 1531

## 2018-12-18 DIAGNOSIS — M5090 Cervical disc disorder, unspecified, unspecified cervical region: Secondary | ICD-10-CM | POA: Diagnosis not present

## 2018-12-31 NOTE — Telephone Encounter (Signed)
LMTCB

## 2019-01-06 DIAGNOSIS — H353131 Nonexudative age-related macular degeneration, bilateral, early dry stage: Secondary | ICD-10-CM | POA: Diagnosis not present

## 2019-01-06 DIAGNOSIS — Z961 Presence of intraocular lens: Secondary | ICD-10-CM | POA: Diagnosis not present

## 2019-02-03 ENCOUNTER — Other Ambulatory Visit: Payer: Self-pay | Admitting: Internal Medicine

## 2019-02-06 ENCOUNTER — Telehealth: Payer: Self-pay | Admitting: Internal Medicine

## 2019-02-06 NOTE — Telephone Encounter (Signed)
rx sent in for temazepam.  

## 2019-02-06 NOTE — Telephone Encounter (Signed)
Overdue f/u appt with me.  Please schedule f/u appt.  Thanks.

## 2019-02-08 NOTE — Telephone Encounter (Signed)
Called and scheduled pt for 02/23/19

## 2019-02-22 ENCOUNTER — Other Ambulatory Visit: Payer: Self-pay | Admitting: Internal Medicine

## 2019-02-23 ENCOUNTER — Ambulatory Visit (INDEPENDENT_AMBULATORY_CARE_PROVIDER_SITE_OTHER): Payer: Medicare Other | Admitting: Internal Medicine

## 2019-02-23 ENCOUNTER — Telehealth: Payer: Self-pay

## 2019-02-23 ENCOUNTER — Encounter: Payer: Self-pay | Admitting: Internal Medicine

## 2019-02-23 ENCOUNTER — Other Ambulatory Visit: Payer: Self-pay

## 2019-02-23 ENCOUNTER — Telehealth: Payer: Self-pay | Admitting: Internal Medicine

## 2019-02-23 DIAGNOSIS — Z8673 Personal history of transient ischemic attack (TIA), and cerebral infarction without residual deficits: Secondary | ICD-10-CM | POA: Diagnosis not present

## 2019-02-23 DIAGNOSIS — E7800 Pure hypercholesterolemia, unspecified: Secondary | ICD-10-CM

## 2019-02-23 DIAGNOSIS — D649 Anemia, unspecified: Secondary | ICD-10-CM

## 2019-02-23 DIAGNOSIS — C73 Malignant neoplasm of thyroid gland: Secondary | ICD-10-CM

## 2019-02-23 DIAGNOSIS — R739 Hyperglycemia, unspecified: Secondary | ICD-10-CM | POA: Diagnosis not present

## 2019-02-23 DIAGNOSIS — I700 Atherosclerosis of aorta: Secondary | ICD-10-CM | POA: Diagnosis not present

## 2019-02-23 DIAGNOSIS — I2510 Atherosclerotic heart disease of native coronary artery without angina pectoris: Secondary | ICD-10-CM

## 2019-02-23 DIAGNOSIS — F329 Major depressive disorder, single episode, unspecified: Secondary | ICD-10-CM

## 2019-02-23 DIAGNOSIS — I10 Essential (primary) hypertension: Secondary | ICD-10-CM | POA: Diagnosis not present

## 2019-02-23 DIAGNOSIS — F32A Depression, unspecified: Secondary | ICD-10-CM

## 2019-02-23 MED ORDER — TEMAZEPAM 7.5 MG PO CAPS: 7.5000 mg | ORAL_CAPSULE | Freq: Every evening | ORAL | 0 refills | Status: DC | PRN

## 2019-02-23 NOTE — Telephone Encounter (Signed)
Please notify pt that restoril 7.5mg  has been sent in.  Have her stop the 15mg .  Make sure not taking both.

## 2019-02-23 NOTE — Progress Notes (Addendum)
Virtual Visit via telephone Note  This visit type was conducted due to national recommendations for restrictions regarding the COVID-19 pandemic (e.g. social distancing).  This format is felt to be most appropriate for this patient at this time.  All issues noted in this document were discussed and addressed.  No physical exam was performed (except for noted visual exam findings with Video Visits).   I connected with Tamara [name] by telephone and verified that I am speaking with the correct person using two identifiers. Location patient: home Location provider: work  Persons participating in the telephone visit: patient, provider  The limitations, risks, security and privacy concerns of performing an evaluation and management service by telephone and the availability of in person appointments have been discussed. The patient expressed understanding and agreed to proceed.   Reason for visit: scheduled follow up.   HPI: Seeing endocrinology for f/u papillary thyroid cancer s/p thyroidectomy.  Last evlauated 10/2018.  Recommended f/u in 6 months.  States her blood pressure has been doing ok.  No chest pain or sob reported.  Seeing Dr Rockey Situ. Last evaluated 09/2018.  Stable.  Recommended f/u in one year.  Eating.  No nausea or vomiting reported.  No abdominal pain.  Having issues with neuropathy. On gabapentin and now on lyrica.  Helping.  Walking her dog.  Increased stress.  Daughter diagnosed with cancer.  Overall handling stress relatively well.  Not on cymbalta.  Does not take restoril regularly.  Discussed decreasing dose.  Some light headedness when stands quickly, but otherwise doing well.  No persistent dizziness.  No headache.    ROS: See pertinent positives and negatives per HPI.  Past Medical History:  Diagnosis Date  . Anemia   . Arthritis   . Chicken pox   . Colitis   . Degenerative joint disease   . Diverticulitis   . Diverticulosis   . Fibrocystic breast disease   . GERD  (gastroesophageal reflux disease)    EGD - gastritis/mild duodenitis, previous positive H. pylori  . Glaucoma   . Heart murmur   . Helicobacter pylori ab+   . History of depression   . History of skin cancer    facial  . Hypercholesterolemia   . Hypertension   . IBS (irritable bowel syndrome)   . Internal hemorrhoids   . Migraines   . Osteopenia   . Stroke ([place])   . Thyroid disease   . Tubular adenoma of colon   . Vitamin D deficiency     Past Surgical History:  Procedure Laterality Date  . ABDOMINAL HYSTERECTOMY  1973   secondary to bleeding  . APPENDECTOMY    . BILATERAL SALPINGOOPHORECTOMY  2004   complex ovarian cyst (left) - benign  . BREAST BIOPSY Bilateral    x4- neg  . MANDIBLE FRACTURE SURGERY    . VENTRAL HERNIA REPAIR  2005   Dr. Tamala Julian    Family History  Problem Relation Age of Onset  . Esophageal cancer Mother   . Stomach cancer Mother   . Heart disease Father   . Cirrhosis Brother   . Parkinson's disease Brother   . Ovarian cancer Sister   . Colon cancer Sister        half-sister  . Migraines Daughter   . Heart attack Son   . Breast cancer Neg Hx     SOCIAL HX: reviewed.    Current Outpatient Medications:  .  Apple Cider Vinegar 500 MG TABS, Take 500 mg by mouth  daily., Disp: , Rfl:  .  aspirin 81 MG tablet, Take 81 mg by mouth daily., Disp: , Rfl:  .  clopidogrel (PLAVIX) 75 MG tablet, TAKE 1 TABLET (75 MG TOTAL) BY MOUTH DAILY., Disp: 90 tablet, Rfl: 0 .  gabapentin (NEURONTIN) 100 MG capsule, Take 100 mg by mouth 2 (two) times daily., Disp: , Rfl:  .  levothyroxine (SYNTHROID, LEVOTHROID) 75 MCG tablet, Take 75 mcg by mouth daily., Disp: , Rfl:  .  losartan (COZAAR) 50 MG tablet, TAKE 1 TABLET EVERY DAY, Disp: 90 tablet, Rfl: 1 .  meclizine (ANTIVERT) 25 MG tablet, Take 25 mg by mouth 3 (three) times daily as needed., Disp: , Rfl:  .  PARoxetine (PAXIL) 30 MG tablet, TAKE 1 TABLET EVERY DAY, Disp: 90 tablet, Rfl: 1 .  polyethylene glycol  (MIRALAX / GLYCOLAX) packet, Take 17 g by mouth daily as needed for mild constipation., Disp: , Rfl:  .  pregabalin (LYRICA) 100 MG capsule, TAKE '100MG'$  IN THE MORNING AND '50MG'$  AT NIGHT FOR ONE WEEK, THEN INCREASE TO '100MG'$  TWICE DAILY AS DIRECTED, Disp: , Rfl:  .  rosuvastatin (CRESTOR) 40 MG tablet, TAKE 1 TABLET (40 MG TOTAL) BY MOUTH DAILY., Disp: 90 tablet, Rfl: 2 .  temazepam (RESTORIL) 7.5 MG capsule, Take 1 capsule (7.5 mg total) by mouth at bedtime as needed for sleep., Disp: 30 capsule, Rfl: 0  EXAM:  VITALS per patient if applicable:  [id number]  GENERAL: alert.  Sounds to be in no acute distress.  Answering questions appropriately.    PSYCH/NEURO: pleasant and cooperative, no obvious depression or anxiety, speech and thought processing grossly intact  ASSESSMENT AND PLAN:  Discussed the following assessment and plan:  Anemia Follow cbc.   Aortic atherosclerosis ([place]) On crestor.   CAD (coronary artery disease) Currently asymptomatic.  Continue risk factor modification.   Depression Not taking cymbalta.  Not taking restoril on a regular basis.  Discussed decreasing dose of restoril to 7.'5mg'$  q hs.  Follow.  Overall stable.    History of CVA (cerebrovascular accident) Doing well on aspirin and plavix.  Follow.    Hypercholesterolemia On crestor.  Follow lipid panel and liver function tests.    Hyperglycemia Low carb diet and exercise.  Follow met b and a1c.   Hypertension Blood pressure under good control.  Continue same medication regimen.  Follow pressures.  Follow metabolic panel.    Thyroid cancer [facility]) S/p thyroidectomy.  TSH goal - .3 - 2.  On synthroid. Last evaluated by endocrinology 09/2018.  Recommended f/u in 6 months.      I discussed the assessment and treatment plan with the patient. The patient was provided an opportunity to ask questions and all were answered. The patient agreed with the plan and demonstrated an understanding of the instructions.   The  patient was advised to call back or seek an in-person evaluation if the symptoms worsen or if the condition fails to improve as anticipated.  I provided 23 minutes of non-face-to-face time during this encounter.   Einar Pheasant, MD

## 2019-02-23 NOTE — Telephone Encounter (Signed)
Lm to schedule fasting lab in 1-2 weeks and a 8m follow up

## 2019-02-23 NOTE — Telephone Encounter (Signed)
Spoke with pharmacist at [facility] and she stated that the 7.5mg  will go through fine however it is going to cost her with insurance $143.14 and cash price will be $71.57.

## 2019-02-24 NOTE — Telephone Encounter (Signed)
Left detailed message for patient.

## 2019-02-24 NOTE — Telephone Encounter (Signed)
Patient called back to let me know that the restoril 7.5 mg is going to be too expensive. It is going to be over $100 for a 30 day and she is only paying about $3 for the 15 mg. There is a message below where Janett Billow spoke with [facility] regarding this as well. Pt would like to stay on the 15 mg and just use prn because she cannot afford the 7.5 mg. She does not need a prescription right now. She just picked some of the 15 mg up this month.

## 2019-02-27 NOTE — Assessment & Plan Note (Signed)
Not taking cymbalta.  Not taking restoril on a regular basis.  Discussed decreasing dose of restoril to 7.5mg  q hs.  Follow.  Overall stable.

## 2019-02-27 NOTE — Assessment & Plan Note (Signed)
On crestor.  Follow lipid panel and liver function tests.   

## 2019-02-27 NOTE — Assessment & Plan Note (Signed)
Doing well on aspirin and plavix.  Follow.   

## 2019-02-27 NOTE — Assessment & Plan Note (Signed)
Low carb diet and exercise.  Follow met b and a1c.  

## 2019-02-27 NOTE — Assessment & Plan Note (Signed)
Blood pressure under good control.  Continue same medication regimen.  Follow pressures.  Follow metabolic panel.   

## 2019-02-27 NOTE — Assessment & Plan Note (Addendum)
S/p thyroidectomy.  TSH goal - .3 - 2.  On synthroid. Last evaluated by endocrinology 09/2018.  Recommended f/u in 6 months.

## 2019-02-27 NOTE — Assessment & Plan Note (Signed)
Currently asymptomatic.  Continue risk factor modification.  

## 2019-02-27 NOTE — Assessment & Plan Note (Signed)
Follow cbc.  

## 2019-02-27 NOTE — Assessment & Plan Note (Signed)
On crestor.   

## 2019-03-04 ENCOUNTER — Other Ambulatory Visit: Payer: Self-pay | Admitting: Internal Medicine

## 2019-03-31 ENCOUNTER — Other Ambulatory Visit: Payer: Self-pay | Admitting: Internal Medicine

## 2019-04-03 ENCOUNTER — Other Ambulatory Visit: Payer: Self-pay | Admitting: Internal Medicine

## 2019-04-03 NOTE — Telephone Encounter (Signed)
rx ok'd for temazepam #30 with no refills.   °

## 2019-04-16 DIAGNOSIS — M79605 Pain in left leg: Secondary | ICD-10-CM | POA: Diagnosis not present

## 2019-04-16 DIAGNOSIS — M5090 Cervical disc disorder, unspecified, unspecified cervical region: Secondary | ICD-10-CM | POA: Diagnosis not present

## 2019-04-16 DIAGNOSIS — M79604 Pain in right leg: Secondary | ICD-10-CM | POA: Diagnosis not present

## 2019-04-16 DIAGNOSIS — I6930 Unspecified sequelae of cerebral infarction: Secondary | ICD-10-CM | POA: Diagnosis not present

## 2019-04-16 DIAGNOSIS — F5104 Psychophysiologic insomnia: Secondary | ICD-10-CM | POA: Diagnosis not present

## 2019-04-16 DIAGNOSIS — I729 Aneurysm of unspecified site: Secondary | ICD-10-CM | POA: Diagnosis not present

## 2019-04-16 DIAGNOSIS — M792 Neuralgia and neuritis, unspecified: Secondary | ICD-10-CM | POA: Diagnosis not present

## 2019-06-10 ENCOUNTER — Other Ambulatory Visit: Payer: Self-pay | Admitting: Internal Medicine

## 2019-06-10 NOTE — Telephone Encounter (Signed)
rx ok'd for temazepam #30 with no refills.   °

## 2019-06-10 NOTE — Telephone Encounter (Signed)
Refill request for restoril, last seen 02-23-19, last filled 04-03-19.  Please advise.

## 2019-06-19 ENCOUNTER — Emergency Department
Admission: EM | Admit: 2019-06-19 | Discharge: 2019-06-19 | Disposition: A | Discharge status: Home / Self Care | Payer: Medicare Other | Attending: Emergency Medicine | Admitting: Emergency Medicine

## 2019-06-19 ENCOUNTER — Other Ambulatory Visit: Payer: Self-pay

## 2019-06-19 ENCOUNTER — Emergency Department: Payer: Medicare Other

## 2019-06-19 ENCOUNTER — Encounter: Payer: Self-pay | Admitting: Emergency Medicine

## 2019-06-19 DIAGNOSIS — T7840XA Allergy, unspecified, initial encounter: Secondary | ICD-10-CM | POA: Diagnosis not present

## 2019-06-19 DIAGNOSIS — Z85828 Personal history of other malignant neoplasm of skin: Secondary | ICD-10-CM | POA: Diagnosis not present

## 2019-06-19 DIAGNOSIS — I10 Essential (primary) hypertension: Secondary | ICD-10-CM | POA: Diagnosis not present

## 2019-06-19 DIAGNOSIS — R519 Headache, unspecified: Secondary | ICD-10-CM | POA: Diagnosis not present

## 2019-06-19 DIAGNOSIS — Z7982 Long term (current) use of aspirin: Secondary | ICD-10-CM | POA: Insufficient documentation

## 2019-06-19 DIAGNOSIS — I2510 Atherosclerotic heart disease of native coronary artery without angina pectoris: Secondary | ICD-10-CM | POA: Insufficient documentation

## 2019-06-19 DIAGNOSIS — R42 Dizziness and giddiness: Secondary | ICD-10-CM | POA: Diagnosis not present

## 2019-06-19 DIAGNOSIS — Z8673 Personal history of transient ischemic attack (TIA), and cerebral infarction without residual deficits: Secondary | ICD-10-CM | POA: Insufficient documentation

## 2019-06-19 DIAGNOSIS — R531 Weakness: Secondary | ICD-10-CM | POA: Diagnosis not present

## 2019-06-19 DIAGNOSIS — Z79899 Other long term (current) drug therapy: Secondary | ICD-10-CM | POA: Diagnosis not present

## 2019-06-19 DIAGNOSIS — W19XXXA Unspecified fall, initial encounter: Secondary | ICD-10-CM | POA: Diagnosis not present

## 2019-06-19 DIAGNOSIS — R55 Syncope and collapse: Secondary | ICD-10-CM | POA: Diagnosis not present

## 2019-06-19 DIAGNOSIS — I959 Hypotension, unspecified: Secondary | ICD-10-CM | POA: Diagnosis not present

## 2019-06-19 LAB — URINALYSIS, COMPLETE (UACMP) WITH MICROSCOPIC
Bacteria, UA: NONE SEEN
Bilirubin Urine: NEGATIVE
Glucose, UA: NEGATIVE mg/dL
Ketones, ur: NEGATIVE mg/dL
Leukocytes,Ua: NEGATIVE
Nitrite: NEGATIVE
Protein, ur: NEGATIVE mg/dL
Specific Gravity, Urine: 1.003 — ABNORMAL LOW (ref 1.005–1.030)
pH: 7 (ref 5.0–8.0)

## 2019-06-19 LAB — BASIC METABOLIC PANEL
Anion gap: 8 (ref 5–15)
BUN: 12 mg/dL (ref 8–23)
CO2: 27 mmol/L (ref 22–32)
Calcium: 8.1 mg/dL — ABNORMAL LOW (ref 8.9–10.3)
Chloride: 104 mmol/L (ref 98–111)
Creatinine, Ser: 0.82 mg/dL (ref 0.44–1.00)
GFR calc Af Amer: >60 mL/min (ref >60)
GFR calc non Af Amer: >60 mL/min (ref >60)
Glucose, Bld: 105 mg/dL — ABNORMAL HIGH (ref 70–99)
Potassium: 3.9 mmol/L (ref 3.5–5.1)
Sodium: 139 mmol/L (ref 135–145)

## 2019-06-19 LAB — CBC
HCT: 34.3 % — ABNORMAL LOW (ref 36.0–46.0)
Hemoglobin: 11 g/dL — ABNORMAL LOW (ref 12.0–15.0)
MCH: 27.6 pg (ref 26.0–34.0)
MCHC: 32.1 g/dL (ref 30.0–36.0)
MCV: 86.2 fL (ref 80.0–100.0)
Platelets: 236 10*3/uL (ref 150–400)
RBC: 3.98 MIL/uL (ref 3.87–5.11)
RDW: 14.6 % (ref 11.5–15.5)
WBC: 7.6 10*3/uL (ref 4.0–10.5)
nRBC: 0 % (ref 0.0–0.2)

## 2019-06-19 LAB — GLUCOSE, CAPILLARY: Glucose-Capillary: 98 mg/dL (ref 70–99)

## 2019-06-19 MED ORDER — SODIUM CHLORIDE 0.9 % IV BOLUS
1000.0000 mL | Freq: Once | INTRAVENOUS | Status: AC
  Administered 2019-06-19: 1000 mL via INTRAVENOUS

## 2019-06-19 MED ORDER — FAMOTIDINE IN NACL 20-0.9 MG/50ML-% IV SOLN
20.0000 mg | Freq: Once | INTRAVENOUS | Status: AC
  Administered 2019-06-19: 20 mg via INTRAVENOUS
  Filled 2019-06-19: qty 50

## 2019-06-19 MED ORDER — DIPHENHYDRAMINE HCL 50 MG/ML IJ SOLN
25.0000 mg | Freq: Once | INTRAMUSCULAR | Status: AC
  Administered 2019-06-19: 25 mg via INTRAVENOUS
  Filled 2019-06-19: qty 1

## 2019-06-19 NOTE — ED Notes (Signed)
Transported to [facility] 

## 2019-06-19 NOTE — ED Notes (Signed)
Pt up to void at this time, states she was able to go a little bit, but did not get any in the hat.   denies any dizziness states she felt pretty good, rash still present and red, VSS at this time. Daughter at bedside.

## 2019-06-19 NOTE — ED Triage Notes (Addendum)
Pt arrived via [facility] from home with reports of near syncopal episode.  Per EMS pt was going into kitchen to make coffee when she felt faint and turned pale. Pt is on blood thinners.  Pt fell against the hood vent and has knot on the top of head.  Pt has hx of thyroid CA, CVA x2 .  On arrival it was noted that patient had large hives to neck, chest, abdomen, perineum and back. Pt has small area on left knee.  Pt states she is allergic to ibuprofen if she takes too much.  Pt c/o dizziness with EMS and HA.   Alert and oriented on arrival.   Per EMS pt did have some BP changes when standing.   Pt also has been complaining of back pain x 2 days per EMS.

## 2019-06-19 NOTE — ED Provider Notes (Signed)
[facility] Emergency Department Provider Note  Time seen: 8:44 AM  I have reviewed the triage vital signs and the nursing notes.   HISTORY  Chief Complaint Near Syncope   HPI Tamara [name] is a 86 y.o. female with a past medical history of arthritis, diverticulitis, gastric reflux, hypertension, hyperlipidemia, presents to the emergency department for a near syncopal episode.  According to the patient she was making breakfast this morning when she began feeling very weak and lightheaded.  Patient did fall against the stove hood and hit her head.  Patient takes Plavix.  Patient denies any food this morning denies any new medications this morning did take her Synthroid this morning.  Patient states she did take ibuprofen last night and has an allergy to ibuprofen if she takes "too much."  Patient found to have diffuse hives over her body upon arrival.  Denies any trouble breathing or nausea.  Patient is feeling better now that she is lying down.  Blood pressure currently 147/65.   Past Medical History:  Diagnosis Date  . Anemia   . Arthritis   . Chicken pox   . Colitis   . Degenerative joint disease   . Diverticulitis   . Diverticulosis   . Fibrocystic breast disease   . GERD (gastroesophageal reflux disease)    EGD - gastritis/mild duodenitis, previous positive H. pylori  . Glaucoma   . Heart murmur   . Helicobacter pylori ab+   . History of depression   . History of skin cancer    facial  . Hypercholesterolemia   . Hypertension   . IBS (irritable bowel syndrome)   . Internal hemorrhoids   . Migraines   . Osteopenia   . Stroke ([place])   . Thyroid disease   . Tubular adenoma of colon   . Vitamin D deficiency     Patient Active Problem List   Diagnosis Date Noted  . Anemia 09/06/2018  . UTI (urinary tract infection) 04/22/2018  . Labial lesion 04/18/2018  . Leg pain 04/18/2018  . Recurrent UTI 01/25/2017  . Thyroid cancer ([place]) 08/07/2016  .  Right arm numbness 05/31/2015  . Aortic atherosclerosis ([place]) 05/15/2015  . CAD (coronary artery disease) 05/15/2015  . Headache, migraine 05/09/2015  . History of colon polyps 05/09/2015  . Arthritis, degenerative 05/09/2015  . Abdominal pain 04/25/2015  . Diarrhea 04/15/2015  . Right knee pain 02/13/2015  . Hyperglycemia 08/11/2014  . Murmur, heart 05/17/2014  . Hyperlipidemia 05/17/2014  . Health care maintenance 04/17/2014  . Carotid stenosis 11/15/2013  . Dysphagia 10/28/2013  . Back pain 06/06/2013  . Internal nasal lesion 06/06/2013  . Fatigue 05/09/2013  . Fluttering heart 10/12/2012  . History of CVA (cerebrovascular accident) 09/28/2012  . Left carotid bruit 01/17/2012  . Osteopenia 01/17/2012  . GERD (gastroesophageal reflux disease) 01/17/2012  . Depression 01/17/2012  . Hypertension 12/18/2011  . Hypercholesterolemia 12/18/2011    Past Surgical History:  Procedure Laterality Date  . ABDOMINAL HYSTERECTOMY  1973   secondary to bleeding  . APPENDECTOMY    . BILATERAL SALPINGOOPHORECTOMY  2004   complex ovarian cyst (left) - benign  . BREAST BIOPSY Bilateral    x4- neg  . MANDIBLE FRACTURE SURGERY    . VENTRAL HERNIA REPAIR  2005   Dr. Tamala Julian    Prior to Admission medications   Medication Sig Start Date End Date Taking? Authorizing Provider  Apple Cider Vinegar 500 MG TABS Take 500 mg by mouth daily.  [provider]  aspirin 81 MG tablet Take 81 mg by mouth daily.    [provider]  clopidogrel (PLAVIX) 75 MG tablet TAKE 1 TABLET (75 MG TOTAL) BY MOUTH DAILY. 12/07/18   Minna Merritts, MD  gabapentin (NEURONTIN) 100 MG capsule Take 100 mg by mouth 2 (two) times daily. 09/02/18 02/23/19  [provider]  levothyroxine (SYNTHROID, LEVOTHROID) 75 MCG tablet Take 75 mcg by mouth daily. 12/03/17   [provider]  losartan (COZAAR) 50 MG tablet TAKE 1 TABLET EVERY DAY 03/31/19   Einar Pheasant, MD  meclizine (ANTIVERT) 25 MG  tablet Take 25 mg by mouth 3 (three) times daily as needed. 12/17/18   [provider]  PARoxetine (PAXIL) 30 MG tablet TAKE 1 TABLET EVERY DAY 03/08/19   Einar Pheasant, MD  polyethylene glycol (MIRALAX / GLYCOLAX) packet Take 17 g by mouth daily as needed for mild constipation.    [provider]  pregabalin (LYRICA) 100 MG capsule TAKE 100MG  IN THE MORNING AND 50MG  AT NIGHT FOR ONE WEEK, THEN INCREASE TO 100MG  TWICE DAILY AS DIRECTED 02/01/19   [provider]  rosuvastatin (CRESTOR) 40 MG tablet TAKE 1 TABLET (40 MG TOTAL) BY MOUTH DAILY. 02/23/19   Einar Pheasant, MD  temazepam (RESTORIL) 15 MG capsule Take 1 capsule (15 mg total) by mouth at bedtime as needed. 06/10/19   Einar Pheasant, MD    Allergies  Allergen Reactions  . Flagyl [Metronidazole]     Throat burning   . Omnicef [Cefdinir]     Throat burning  . Other     Steroids "all steroids causes burning in esophagus and stomach"  . Prednisone Other (See Comments)    inflammed esophagus  . Shrimp [Shellfish Allergy]     "sick on stomach"  . Tylenol [Acetaminophen] Other (See Comments)    Rash with high dose   . Vicodin [Hydrocodone-Acetaminophen] Nausea And Vomiting  . Ibuprofen Rash    Can't take high doses    Family History  Problem Relation Age of Onset  . Esophageal cancer Mother   . Stomach cancer Mother   . Heart disease Father   . Cirrhosis Brother   . Parkinson's disease Brother   . Ovarian cancer Sister   . Colon cancer Sister        half-sister  . Migraines Daughter   . Heart attack Son   . Breast cancer Neg Hx     Social History Social History   Tobacco Use  . Smoking status: Never Smoker  . Smokeless tobacco: Never Used  Substance Use Topics  . Alcohol use: No    Alcohol/week: 0.0 standard drinks  . Drug use: No    Review of Systems Constitutional: Negative for fever.  Weakness this morning. Cardiovascular: Negative for chest pain. Respiratory: Negative for  shortness of breath. Gastrointestinal: Negative for abdominal pain, vomiting  Musculoskeletal: Negative for musculoskeletal complaints Skin: Diffuse hives Neurological: Negative for headache All other ROS negative  ____________________________________________   PHYSICAL EXAM:  VITAL SIGNS: ED Triage Vitals  Enc Vitals Group     BP 06/19/19 0835 (!) 147/65     Pulse Rate 06/19/19 0835 67     Resp 06/19/19 0835 16     Temp 06/19/19 0835 98.7 F (37.1 C)     Temp Source 06/19/19 0835 Oral     SpO2 06/19/19 0835 97 %     Weight 06/19/19 0837 137 lb (62.1 kg)     Height 06/19/19 0837  5' 3.5" (1.613 m)     Head Circumference --      Peak Flow --      Pain Score 06/19/19 0836 8     Pain Loc --      Pain Edu? --      Excl. in [place]? --     Constitutional: Alert and oriented. Well appearing and in no distress. Eyes: Normal exam ENT      Head: Small hematoma to top of scalp.      Mouth/Throat: Mucous membranes are moist. Cardiovascular: Normal rate, regular rhythm.  Respiratory: Normal respiratory effort without tachypnea nor retractions. Breath sounds are clear, no wheeze. Gastrointestinal: Soft and nontender. No distention.  Musculoskeletal: Nontender with normal range of motion in all extremities.  Neurologic:  Normal speech and language. No gross focal neurologic deficits  Skin:  Skin is warm.  Diffuse urticaria over her torso, coalescing into larger areas of rash in some places. Psychiatric: Mood and affect are normal.   ____________________________________________    EKG  EKG viewed and interpreted by myself shows a normal sinus rhythm at 65 bpm with a slightly widened QRS, normal axis, largely normal intervals with nonspecific ST changes.  ____________________________________________    RADIOLOGY  CT head negative  ____________________________________________   INITIAL IMPRESSION / ASSESSMENT AND PLAN / ED COURSE  Pertinent labs & imaging results that were  available during my care of the patient were reviewed by me and considered in my medical decision making (see chart for details).   Patient presents to the emergency department after a near syncopal episode this morning.  Patient has diffuse hives of her body.  Highly suspect allergic reaction leading to transient hypotension and her near syncopal episode.  We will check labs as a precaution we will dose Benadryl and Pepcid as well as IV fluids.  Patient has a allergy listed to "all steroids."  We will hold off on steroids at this time and continue to closely monitor.  Overall the patient appears well, does appear to be suffering from an allergic reaction but has no wheeze denies any shortness of breath nausea.  Blood pressure is improved.  Lab work is largely within normal limits.  Urinalysis has resulted normal as well.  CT scan of the head shows no acute abnormality.  Patient has been ambulatory without issue without lightheadedness or dizziness.  Mild improvement in the rash although no significant improvement.  Family is now here with the patient states that rash has been present for approximately 3 days but the patient has been taking increased amounts of ibuprofen each day to which she has an allergy.  Discussed with patient and family to discontinue use of ibuprofen.  Patient feels comfortable going home.  We will discharge with PCP follow-up.  Discussed return precautions.  LINELLE HAYNIE was evaluated in Emergency Department on 06/19/2019 for the symptoms described in the history of present illness. She was evaluated in the context of the global COVID-19 pandemic, which necessitated consideration that the patient might be at risk for infection with the SARS-CoV-2 virus that causes COVID-19. Institutional protocols and algorithms that pertain to the evaluation of patients at risk for COVID-19 are in a state of rapid change based on information released by regulatory bodies including the CDC and federal  and state organizations. These policies and algorithms were followed during the patient's care in the ED.  ____________________________________________   FINAL CLINICAL IMPRESSION(S) / ED DIAGNOSES  Allergic reaction Near syncope   Harvest Dark,  MD 06/19/19 1141

## 2019-07-05 ENCOUNTER — Other Ambulatory Visit: Payer: Self-pay | Admitting: Cardiovascular Disease

## 2019-07-05 DIAGNOSIS — H353131 Nonexudative age-related macular degeneration, bilateral, early dry stage: Secondary | ICD-10-CM | POA: Diagnosis not present

## 2019-07-05 MED ORDER — CLOPIDOGREL BISULFATE 75 MG PO TABS: 75.0000 mg | ORAL_TABLET | Freq: Every day | ORAL | 0 refills | Status: DC

## 2019-07-05 NOTE — Telephone Encounter (Signed)
*  STAT* If patient is at the pharmacy, call can be transferred to refill team.   1. Which medications need to be refilled? (please list name of each medication and dose if known) clopidogrel 75 mg daily  2. Which pharmacy/location (including street and city if local pharmacy) is medication to be sent to? [facility]  3. Do they need a 30 day or 90 day supply? [place]

## 2019-07-05 NOTE — Telephone Encounter (Signed)
Requested Prescriptions   Signed Prescriptions Disp Refills   clopidogrel (PLAVIX) 75 MG tablet 90 tablet 0    Sig: Take 1 tablet (75 mg total) by mouth daily.    Authorizing Provider: GOLLAN, TIMOTHY J    Ordering User: NEWCOMER MCCLAIN, Eyla Tallon L    

## 2019-07-19 ENCOUNTER — Other Ambulatory Visit: Payer: Self-pay | Admitting: Internal Medicine

## 2019-07-19 NOTE — Telephone Encounter (Signed)
rx ok'd for temazepam x1.  Needs a f/u appt scheduled.

## 2019-08-05 DIAGNOSIS — Z85850 Personal history of malignant neoplasm of thyroid: Secondary | ICD-10-CM | POA: Diagnosis not present

## 2019-08-05 DIAGNOSIS — E890 Postprocedural hypothyroidism: Secondary | ICD-10-CM | POA: Diagnosis not present

## 2019-08-09 DIAGNOSIS — E890 Postprocedural hypothyroidism: Secondary | ICD-10-CM | POA: Diagnosis not present

## 2019-08-09 DIAGNOSIS — Z85850 Personal history of malignant neoplasm of thyroid: Secondary | ICD-10-CM | POA: Diagnosis not present

## 2019-08-19 ENCOUNTER — Other Ambulatory Visit: Payer: Self-pay | Admitting: Internal Medicine

## 2019-08-19 ENCOUNTER — Telehealth: Payer: Self-pay | Admitting: Internal Medicine

## 2019-08-19 NOTE — Telephone Encounter (Signed)
Needs a f/u appt scheduled with me.  Thanks.

## 2019-08-20 NOTE — Telephone Encounter (Signed)
LMTCB and schedule follow up appt

## 2019-08-30 ENCOUNTER — Other Ambulatory Visit: Payer: Self-pay | Admitting: Cardiovascular Disease

## 2019-10-01 ENCOUNTER — Other Ambulatory Visit: Payer: Self-pay | Admitting: Internal Medicine

## 2019-10-01 NOTE — Telephone Encounter (Signed)
I have not seen her since 01/2019.  Needs a f/u appt scheduled.  I do not mind refilling, but needs appt.

## 2019-10-01 NOTE — Telephone Encounter (Signed)
LMTCB

## 2019-10-04 ENCOUNTER — Other Ambulatory Visit: Payer: Self-pay

## 2019-10-04 ENCOUNTER — Encounter: Payer: Self-pay | Admitting: Emergency Medicine

## 2019-10-04 ENCOUNTER — Emergency Department
Admission: EM | Admit: 2019-10-04 | Discharge: 2019-10-04 | Disposition: A | Discharge status: Home / Self Care | Payer: Medicare Other | Attending: Emergency Medicine | Admitting: Emergency Medicine

## 2019-10-04 DIAGNOSIS — Z5321 Procedure and treatment not carried out due to patient leaving prior to being seen by health care provider: Secondary | ICD-10-CM | POA: Insufficient documentation

## 2019-10-04 DIAGNOSIS — R42 Dizziness and giddiness: Secondary | ICD-10-CM | POA: Diagnosis not present

## 2019-10-04 DIAGNOSIS — R55 Syncope and collapse: Secondary | ICD-10-CM | POA: Diagnosis not present

## 2019-10-04 DIAGNOSIS — E860 Dehydration: Secondary | ICD-10-CM | POA: Diagnosis not present

## 2019-10-04 DIAGNOSIS — R110 Nausea: Secondary | ICD-10-CM | POA: Diagnosis not present

## 2019-10-04 DIAGNOSIS — I959 Hypotension, unspecified: Secondary | ICD-10-CM | POA: Diagnosis not present

## 2019-10-04 LAB — BASIC METABOLIC PANEL
Anion gap: 11 (ref 5–15)
BUN: 19 mg/dL (ref 8–23)
CO2: 25 mmol/L (ref 22–32)
Calcium: 8.6 mg/dL — ABNORMAL LOW (ref 8.9–10.3)
Chloride: 102 mmol/L (ref 98–111)
Creatinine, Ser: 0.93 mg/dL (ref 0.44–1.00)
GFR calc Af Amer: >60 mL/min (ref >60)
GFR calc non Af Amer: 54 mL/min — ABNORMAL LOW (ref >60)
Glucose, Bld: 101 mg/dL — ABNORMAL HIGH (ref 70–99)
Potassium: 3.8 mmol/L (ref 3.5–5.1)
Sodium: 138 mmol/L (ref 135–145)

## 2019-10-04 LAB — CBC
HCT: 33 % — ABNORMAL LOW (ref 36.0–46.0)
Hemoglobin: 10.9 g/dL — ABNORMAL LOW (ref 12.0–15.0)
MCH: 28.4 pg (ref 26.0–34.0)
MCHC: 33 g/dL (ref 30.0–36.0)
MCV: 85.9 fL (ref 80.0–100.0)
Platelets: 243 10*3/uL (ref 150–400)
RBC: 3.84 MIL/uL — ABNORMAL LOW (ref 3.87–5.11)
RDW: 15.1 % (ref 11.5–15.5)
WBC: 5.6 10*3/uL (ref 4.0–10.5)
nRBC: 0 % (ref 0.0–0.2)

## 2019-10-04 NOTE — ED Triage Notes (Signed)
Pt come into the ED via EMS from home states she had a near syncople episode while outside working in the yard today.  103/53, HR75, CBG 125, #20 LAC 559ml NS infused

## 2019-10-05 ENCOUNTER — Telehealth: Payer: Self-pay | Admitting: Cardiovascular Disease

## 2019-10-05 NOTE — Telephone Encounter (Signed)
Patient daughter calling  Pt c/o Syncope: STAT if syncope occurred within 30 minutes and pt complains of lightheadedness High Priority if episode of passing out, completely, today or in last 24 hours   1. Did you pass out today? No, almost did yesterday, lightheadedness, went to ED last night, but did not stay  2. When is the last time you passed out?  About a month ago, went to ED at the time. States that her BP was elevated, but cannot remember what it was  3. Has this occurred multiple times? no  4. Did you have any symptoms prior to passing out? Lightheadedness about a month ago when she passed out, also hit her head. Hands were tingling.   Patient daughter states she had several mini strokes about 4-5 years ago  Patient has an appt tomorrow 9/8 with Laurann Montana, NP

## 2019-10-05 NOTE — Telephone Encounter (Signed)
Pt daughter called and scheduled a follow up appt for 10/26/19 Pt has 2 days left of medication

## 2019-10-05 NOTE — Telephone Encounter (Signed)
Called and spoke with Tamara [name]'s daughter. Tamara [name] was outside doing yardwork yesterday afternoon when she got lightheaded. We discussed the importance of staying well hydrated, eating regular meals, making position changes slowly. Encouraged to check her BP if she gets lightheaded again. She has an appointment tomorrow.   Loel Dubonnet, NP

## 2019-10-05 NOTE — Progress Notes (Signed)
Office Visit    Patient Name: Tamara [name] Date of Encounter: 10/06/2019  Primary Care Provider:  Einar Pheasant, MD Primary Cardiologist:  Ida Rogue, MD Electrophysiologist:  None    Chief Complaint    Tamara [name] is a 86 y.o. female with a hx of  arthritis, diverticulitis, gastric reflux, HTN, HLD presents today for lightheadedness, near syncope  Past Medical History    Past Medical History:  Diagnosis Date  . Anemia   . Arthritis   . Chicken pox   . Colitis   . Degenerative joint disease   . Diverticulitis   . Diverticulosis   . Fibrocystic breast disease   . GERD (gastroesophageal reflux disease)    EGD - gastritis/mild duodenitis, previous positive H. pylori  . Glaucoma   . Heart murmur   . Helicobacter pylori ab+   . History of depression   . History of skin cancer    facial  . Hypercholesterolemia   . Hypertension   . IBS (irritable bowel syndrome)   . Internal hemorrhoids   . Migraines   . Osteopenia   . Stroke ([place])   . Thyroid disease   . Tubular adenoma of colon   . Vitamin D deficiency    Past Surgical History:  Procedure Laterality Date  . ABDOMINAL HYSTERECTOMY  1973   secondary to bleeding  . APPENDECTOMY    . BILATERAL SALPINGOOPHORECTOMY  2004   complex ovarian cyst (left) - benign  . BREAST BIOPSY Bilateral    x4- neg  . MANDIBLE FRACTURE SURGERY    . VENTRAL HERNIA REPAIR  2005   Dr. Tamala Julian    Allergies  Allergies  Allergen Reactions  . Flagyl [Metronidazole]     Throat burning   . Omnicef [Cefdinir]     Throat burning  . Other     Steroids "all steroids causes burning in esophagus and stomach"  . Prednisone Other (See Comments)    inflammed esophagus  . Shrimp [Shellfish Allergy]     "sick on stomach"  . Tylenol [Acetaminophen] Other (See Comments)    Rash with high dose   . Vicodin [Hydrocodone-Acetaminophen] Nausea And Vomiting  . Ibuprofen Rash    Can't take high doses    History of Present Illness     Tamara [name] is a 86 y.o. female with a hx of arthritis, diverticulitis, gastric reflux, HTN, HLD, CVA, coronary calcification on CT scan, neuropathy, thyroidectomy last seen 09/2018 by Dr. Rockey Situ.  Previous cardiac workup includes echocardiogram 08/2012 showing LVEF 55-60%, mild MR, mild TR. Carotid duplex 05/2015 with bilateral 1-39% stenosis, vertebral arteries with antegrade flow, normal subclavian arteries bilaterally.  ED visit 06/19/2019 with near syncope. She did not stay to be evaluated but preliminary workup with Hb 10.9, K 3.8, normal renal function, UA unrevealing.  Her daughter shares with me that she was "pale as a ghost ", pinpoint pupils with fixed eyes.  Was difficult to arouse.  Unclear how long her loss of consciousness lasted but she did hit her head.  She went to the ED 10/04/2019 for lightheadedness and near syncope the left without being seen due to wait time.  She was outside doing yard work when she felt lightheaded.  Thankfully an old friend had stopped by to see her and was able to assist her to the front of the house into a chair to sit down.  Reports no recurrent lightheadedness since ED visit earlier this week.  Reports monitoring her blood  pressure at home with help of her daughter who is a CNA with no episodes of hypotension.  Near syncope has not recurred.  Prior to her episodes of near syncope does notice vision changes and blurred vision.  We discussed potential causes of near syncope.  She denies true syncope.  Daughter shares with him that she does not drink much throughout the day and tends to snack.  We reviewed options for further evaluation.  EKGs/Labs/Other Studies Reviewed:   The following studies were reviewed today:   EKG:  EKG is ordered today.  The ekg ordered today demonstrates sinus and 67 bpm with single PAC and known right bundle branch block.  No acute ST/T wave changes.  Recent Labs: 12/16/2018: ALT 10 10/04/2019: BUN 19; Creatinine, Ser 0.93;  Hemoglobin 10.9; Platelets 243; Potassium 3.8; Sodium 138  Recent Lipid Panel    Component Value Date/Time   CHOL 190 04/13/2018 1018   CHOL 307 (H) 09/22/2012 0413   TRIG 90.0 04/13/2018 1018   TRIG 86 09/22/2012 0413   HDL 74.00 04/13/2018 1018   HDL 59 09/22/2012 0413   CHOLHDL 3 04/13/2018 1018   VLDL 18.0 04/13/2018 1018   VLDL 17 09/22/2012 0413   LDLCALC 98 04/13/2018 1018   LDLCALC 231 (H) 09/22/2012 0413   LDLDIRECT 177.7 01/19/2013 0907    Home Medications   Current Meds  Medication Sig  . Apple Cider Vinegar 500 MG TABS Take 500 mg by mouth daily.  [name] aspirin 81 MG tablet Take 81 mg by mouth daily.  . clopidogrel (PLAVIX) 75 MG tablet Take 1 tablet (75 mg total) by mouth daily.  [name] gabapentin (NEURONTIN) 100 MG capsule Take 100 mg by mouth 2 (two) times daily.  [name] levothyroxine (SYNTHROID, LEVOTHROID) 75 MCG tablet Take 75 mcg by mouth daily.  [name] losartan (COZAAR) 50 MG tablet TAKE 1 TABLET EVERY DAY  . meclizine (ANTIVERT) 25 MG tablet Take 25 mg by mouth 3 (three) times daily as needed.  [name] PARoxetine (PAXIL) 30 MG tablet TAKE 1 TABLET EVERY DAY  . polyethylene glycol (MIRALAX / GLYCOLAX) packet Take 17 g by mouth daily as needed for mild constipation.  . pregabalin (LYRICA) 100 MG capsule TAKE 100MG  IN THE MORNING AND 50MG  AT NIGHT FOR ONE WEEK, THEN INCREASE TO 100MG  TWICE DAILY AS DIRECTED  . rosuvastatin (CRESTOR) 40 MG tablet TAKE 1 TABLET (40 MG TOTAL) BY MOUTH DAILY.  [name] temazepam (RESTORIL) 15 MG capsule TAKE 1 CAPSULE AT BEDTIME AS NEEDED  . [DISCONTINUED] clopidogrel (PLAVIX) 75 MG tablet Take 1 tablet (75 mg total) by mouth daily.      Review of Systems   Review of Systems  Constitutional: Negative for chills, fever and malaise/fatigue.  Cardiovascular: Positive for near-syncope. Negative for chest pain, dyspnea on exertion, irregular heartbeat, leg swelling, orthopnea, palpitations and syncope.  Respiratory: Negative for cough, shortness of breath and  wheezing.   Gastrointestinal: Negative for melena, nausea and vomiting.  Genitourinary: Negative for hematuria.  Neurological: Positive for light-headedness. Negative for dizziness and weakness.    All other systems reviewed and are otherwise negative except as noted above.  Physical Exam    VS:  BP 128/72 (BP Location: Left Arm, Patient Position: Sitting, Cuff Size: Normal)   Pulse 67   Ht 5' 3.5" (1.613 m)   Wt 139 lb 9.6 oz (63.3 kg)   LMP 01/17/1972   SpO2 96%   BMI 24.34 kg/m  , BMI Body mass index is 24.34 kg/m. GEN: Well  nourished, well developed, in no acute distress. HEENT: normal. Neck: Supple, no JVD, carotid bruits, or masses. Cardiac: RRR, no  rubs, or gallops. 9-3/7 systolic murmur best ausculted at RUSB. No clubbing, cyanosis, edema.  Radials/DP/PT 2+ and equal bilaterally.  Respiratory:  Respirations regular and unlabored, clear to auscultation bilaterally. GI: Soft, nontender, nondistended, BS + x 4. MS: No deformity or atrophy. Skin: Warm and dry, no rash. Neuro:  Strength and sensation are intact. Psych: Normal affect.  Assessment & Plan    1. Lightheadedness/near-syncope - Episode in May of syncope associated with fall.  Episode earlier this week of lightheadedness.  Reports symptoms are not predominantly associated with position changes.  She presented to the ED for both episodes but did not stay to be evaluated.  Lab work suggestive of dehydration.  Some concern for dehydration orthostatic hypotension is contributory though she shares with me that she has had no blood pressure readings when checked at home by her daughter who is a CNA.  14-day live telemetry monitor placed today to rule out arrhythmia or pauses.  Plan for echocardiogram and carotid duplex.  She has longstanding history of murmur per her report but has a significant 2 -3/6 systolic murmur best auscultated at the right upper sternal border concerning for aortic stenosis in the setting of near  syncope with last echo in 2014.  We discussed the importance of remaining well-hydrated, eating regular meals, making position changes slowly.  2. Carotid stenosis -bilateral 1-39% stenosis in 2017.  In setting of near syncope with blurred vision prior plan for carotid duplex.  3. HLD -continue Crestor 40 mg daily.  4. CVA in 2014 -no recurrent symptoms suggestive of CVA.  Continue aspirin, Plavix, statin  5. Coronary artery calcification on CT -stable with no anginal symptoms.  No indication for ischemic evaluation this time.  EKG today without acute ST/T wave changes.  GDMT includes aspirin, Plavix, statin.  Of note due to her age we will discuss continuation of DAPT with her primary cardiologist.  She denies bleeding complications.  Disposition: Follow up in 6 week(s) with Dr. Wallie Renshaw, NP 10/06/2019, 4:47 PM

## 2019-10-06 ENCOUNTER — Ambulatory Visit (INDEPENDENT_AMBULATORY_CARE_PROVIDER_SITE_OTHER): Payer: Medicare Other

## 2019-10-06 ENCOUNTER — Encounter: Payer: Self-pay | Admitting: Family

## 2019-10-06 ENCOUNTER — Other Ambulatory Visit: Payer: Self-pay

## 2019-10-06 ENCOUNTER — Ambulatory Visit (INDEPENDENT_AMBULATORY_CARE_PROVIDER_SITE_OTHER): Payer: Medicare Other | Admitting: Family

## 2019-10-06 VITALS — BP 128/72 | HR 67 | Ht 63.5 in | Wt 139.6 lb

## 2019-10-06 DIAGNOSIS — E7800 Pure hypercholesterolemia, unspecified: Secondary | ICD-10-CM | POA: Diagnosis not present

## 2019-10-06 DIAGNOSIS — I6523 Occlusion and stenosis of bilateral carotid arteries: Secondary | ICD-10-CM

## 2019-10-06 DIAGNOSIS — I10 Essential (primary) hypertension: Secondary | ICD-10-CM

## 2019-10-06 DIAGNOSIS — R011 Cardiac murmur, unspecified: Secondary | ICD-10-CM | POA: Diagnosis not present

## 2019-10-06 DIAGNOSIS — I700 Atherosclerosis of aorta: Secondary | ICD-10-CM

## 2019-10-06 DIAGNOSIS — R55 Syncope and collapse: Secondary | ICD-10-CM

## 2019-10-06 MED ORDER — CLOPIDOGREL BISULFATE 75 MG PO TABS: 75.0000 mg | ORAL_TABLET | Freq: Every day | ORAL | 3 refills | Status: DC

## 2019-10-06 NOTE — Telephone Encounter (Signed)
rx ok'd for temazepam #30 with no refills.

## 2019-10-06 NOTE — Patient Instructions (Addendum)
Medication Instructions:  No medication changes.  *If you need a refill on your cardiac medications before your next appointment, please call your pharmacy*  Lab Work: No lab work today. Your lab work in the ER showed mild kidney decline, this shows you were likely dehydrated.   Testing/Procedures: Your physician has requested that you have a carotid duplex (after 10/20/19). This test is an ultrasound of the carotid arteries in your neck. It looks at blood flow through these arteries that supply the brain with blood. Allow one hour for this exam. There are no restrictions or special instructions.  Your physician has requested that you have an echocardiogram (after 10/20/19). Echocardiography is a painless test that uses sound waves to create images of your heart. It provides your doctor with information about the size and shape of your heart and how well your heart's chambers and valves are working. This procedure takes approximately one hour. There are no restrictions for this procedure. There is a possibility that an IV may need to be started during your test to inject an image enhancing agent. This is done to obtain more optimal pictures of your heart. Therefore we ask that you do at least drink some water prior to coming in to hydrate your veins.    Your physician has recommended that you wear a 14 day Zio AT monitor- placed in office today. This monitor is a medical device that records the heart's electrical activity. Doctors most often use these monitors to diagnose arrhythmias. Arrhythmias are problems with the speed or rhythm of the heartbeat. The monitor is a small device applied to your chest. You can wear one while you do your normal daily activities. While wearing this monitor if you have any symptoms to push the button and record what you felt. Once you have worn this monitor for the period of time provider prescribed (Usually 14 days), you will return the monitor device in the postage paid  box. Once it is returned they will download the data collected and provide [place] with a report which the provider will then review and we will call you with those results. Important tips:  1. Avoid showering during the first 24 hours of wearing the monitor. 2. Avoid excessive sweating to help maximize wear time. 3. Do not submerge the device, no hot tubs, and no swimming pools. 4. Keep any lotions or oils away from the patch. 5. After 24 hours you may shower with the patch on. Take brief showers with your back facing the shower head.  6. Do not remove patch once it has been placed because that will interrupt data and decrease adhesive wear time. 7. Push the button when you have any symptoms and write down what you were feeling. 8. Once you have completed wearing your monitor, remove and place into box which has postage paid and place in your outgoing mailbox.  9. If for some reason you have misplaced your box then call our office and we can provide another box and/or mail it off for you.        Follow-Up: At [facility], you and your health needs are our priority.  As part of our continuing mission to provide you with exceptional heart care, we have created designated Provider Care Teams.  These Care Teams include your primary Cardiologist (physician) and Advanced Practice Providers (APPs -  Physician Assistants and Nurse Practitioners) who all work together to provide you with the care you need, when you need it.  We recommend  signing up for the patient portal called "MyChart".  Sign up information is provided on this After Visit Summary.  MyChart is used to connect with patients for Virtual Visits (Telemedicine).  Patients are able to view lab/test results, encounter notes, upcoming appointments, etc.  Non-urgent messages can be sent to your provider as well.   To learn more about what you can do with MyChart, go to [email or web address].    Your next appointment:   6 week(s)  The  format for your next appointment:   In Person  Provider:   You may see Ida Rogue, MD or one of the following Advanced Practice Providers on your designated Care Team:   Murray Hodgkins, NP  Christell Faith, PA-C  Laurann Montana, NP  Marrianne Mood, PA-C  Other Instructions

## 2019-10-07 ENCOUNTER — Other Ambulatory Visit: Payer: Self-pay | Admitting: Internal Medicine

## 2019-10-07 DIAGNOSIS — R55 Syncope and collapse: Secondary | ICD-10-CM | POA: Diagnosis not present

## 2019-10-28 ENCOUNTER — Telehealth (INDEPENDENT_AMBULATORY_CARE_PROVIDER_SITE_OTHER): Payer: Medicare Other | Admitting: Internal Medicine

## 2019-10-28 ENCOUNTER — Telehealth: Payer: Self-pay | Admitting: Internal Medicine

## 2019-10-28 DIAGNOSIS — K5900 Constipation, unspecified: Secondary | ICD-10-CM

## 2019-10-28 DIAGNOSIS — R55 Syncope and collapse: Secondary | ICD-10-CM | POA: Diagnosis not present

## 2019-10-28 DIAGNOSIS — F439 Reaction to severe stress, unspecified: Secondary | ICD-10-CM

## 2019-10-28 DIAGNOSIS — E785 Hyperlipidemia, unspecified: Secondary | ICD-10-CM | POA: Diagnosis not present

## 2019-10-28 DIAGNOSIS — I700 Atherosclerosis of aorta: Secondary | ICD-10-CM

## 2019-10-28 DIAGNOSIS — Z85850 Personal history of malignant neoplasm of thyroid: Secondary | ICD-10-CM

## 2019-10-28 DIAGNOSIS — D649 Anemia, unspecified: Secondary | ICD-10-CM

## 2019-10-28 DIAGNOSIS — R739 Hyperglycemia, unspecified: Secondary | ICD-10-CM

## 2019-10-28 DIAGNOSIS — I2510 Atherosclerotic heart disease of native coronary artery without angina pectoris: Secondary | ICD-10-CM

## 2019-10-28 DIAGNOSIS — I10 Essential (primary) hypertension: Secondary | ICD-10-CM

## 2019-10-28 DIAGNOSIS — Z8673 Personal history of transient ischemic attack (TIA), and cerebral infarction without residual deficits: Secondary | ICD-10-CM | POA: Diagnosis not present

## 2019-10-28 NOTE — Telephone Encounter (Signed)
Noted  

## 2019-10-28 NOTE — Progress Notes (Signed)
Patient ID: Tamara [name], female   DOB: Mar 11, 1927, 86 y.o.   MRN: [id number]   Virtual Visit via telephone Note  This visit type was conducted due to national recommendations for restrictions regarding the COVID-19 pandemic (e.g. social distancing).  This format is felt to be most appropriate for this patient at this time.  All issues noted in this document were discussed and addressed.  No physical exam was performed (except for noted visual exam findings with Video Visits).   I connected with [name] by telephone and verified that I am speaking with the correct person using two identifiers. Location patient: home Location provider: work  Persons participating in the telephone visit: patient, provider  The limitations, risks, security and privacy concerns of performing an evaluation and management service by telephone and the availability of in person appointments have been discussed.  It has also been discussed with the patient that there may be a patient responsible charge related to this service. The patient expressed understanding and agreed to proceed.   Reason for visit:  Scheduled follow up.   HPI: Scheduled follow up.  Here to f/u regarding recent near syncopal episodes.  She went to ER 05/2019 and 10/04/19 - with near syncope/syncope.  Did not stay to be evaluated - with either visit.  She has seen cardiology since the last episode.  Had holter monitor placed.  Also planning to have ECHO and carotid ultrasound. Has f/u with Dr Rockey Situ scheduled.  No chest pain or sob reported.  Bowels doing better on stool softener and miralax.  Increased stress.  Dog and cat died last year.  Sister and brother-n-law passed recently.     ROS: See pertinent positives and negatives per HPI.  Past Medical History:  Diagnosis Date  . Anemia   . Arthritis   . Chicken pox   . Colitis   . Degenerative joint disease   . Diverticulitis   . Diverticulosis   . Fibrocystic breast disease   . GERD  (gastroesophageal reflux disease)    EGD - gastritis/mild duodenitis, previous positive H. pylori  . Glaucoma   . Heart murmur   . Helicobacter pylori ab+   . History of depression   . History of skin cancer    facial  . Hypercholesterolemia   . Hypertension   . IBS (irritable bowel syndrome)   . Internal hemorrhoids   . Migraines   . Osteopenia   . Stroke ([place])   . Thyroid disease   . Tubular adenoma of colon   . Vitamin D deficiency     Past Surgical History:  Procedure Laterality Date  . ABDOMINAL HYSTERECTOMY  1973   secondary to bleeding  . APPENDECTOMY    . BILATERAL SALPINGOOPHORECTOMY  2004   complex ovarian cyst (left) - benign  . BREAST BIOPSY Bilateral    x4- neg  . MANDIBLE FRACTURE SURGERY    . VENTRAL HERNIA REPAIR  2005   Dr. Tamala Julian    Family History  Problem Relation Age of Onset  . Esophageal cancer Mother   . Stomach cancer Mother   . Heart disease Father   . Cirrhosis Brother   . Parkinson's disease Brother   . Ovarian cancer Sister   . Colon cancer Sister        half-sister  . Migraines Daughter   . Heart attack Son   . Breast cancer Neg Hx     SOCIAL HX: reviewed.    Current Outpatient Medications:  .  Apple Cider Vinegar 500 MG TABS, Take 500 mg by mouth daily., Disp: , Rfl:  .  aspirin 81 MG tablet, Take 81 mg by mouth daily., Disp: , Rfl:  .  clopidogrel (PLAVIX) 75 MG tablet, Take 1 tablet (75 mg total) by mouth daily., Disp: 90 tablet, Rfl: 3 .  gabapentin (NEURONTIN) 100 MG capsule, Take 100 mg by mouth 2 (two) times daily., Disp: , Rfl:  .  levothyroxine (SYNTHROID, LEVOTHROID) 75 MCG tablet, Take 75 mcg by mouth daily., Disp: , Rfl:  .  losartan (COZAAR) 50 MG tablet, TAKE 1 TABLET EVERY DAY, Disp: 90 tablet, Rfl: 1 .  meclizine (ANTIVERT) 25 MG tablet, Take 25 mg by mouth 3 (three) times daily as needed., Disp: , Rfl:  .  PARoxetine (PAXIL) 30 MG tablet, TAKE 1 TABLET EVERY DAY, Disp: 90 tablet, Rfl: 1 .  polyethylene glycol  (MIRALAX / GLYCOLAX) packet, Take 17 g by mouth daily as needed for mild constipation., Disp: , Rfl:  .  pregabalin (LYRICA) 100 MG capsule, TAKE $RemoveBefor'100MG'[id number]$  IN THE MORNING AND $RemoveBef'50MG'TSYfbcTVnz$  AT NIGHT FOR ONE WEEK, THEN INCREASE TO $RemoveBef'100MG'cyYCphVGnC$  TWICE DAILY AS DIRECTED, Disp: , Rfl:  .  rosuvastatin (CRESTOR) 40 MG tablet, TAKE 1 TABLET (40 MG TOTAL) BY MOUTH DAILY., Disp: 90 tablet, Rfl: 2 .  temazepam (RESTORIL) 15 MG capsule, TAKE 1 CAPSULE AT BEDTIME AS NEEDED, Disp: 30 capsule, Rfl: 0  EXAM:  GENERAL: alert, oriented, appears well and in no acute distress  HEENT: atraumatic, conjunttiva clear, no obvious abnormalities on inspection of external nose and ears  NECK: normal movements of the head and neck  LUNGS: on inspection no signs of respiratory distress, breathing rate appears normal, no obvious gross SOB, gasping or wheezing  CV: no obvious cyanosis  PSYCH/NEURO: pleasant and cooperative, no obvious depression or anxiety, speech and thought processing grossly intact  ASSESSMENT AND PLAN:  Discussed the following assessment and plan:  Hypertension Blood pressure has been doing well.  On losartan.  Follow pressures. Follow metabolic panel.   CAD (coronary artery disease) Continue risk factor modification.  Currently asymptomatic.    Aortic atherosclerosis ([place]) On crestor.    History of thyroid cancer S/p thyroidectomy.  On synthroid.  Goal .3 - 2.  Followed by endocrinology.   Hyperlipidemia On crestor.  Low cholesterol diet and exercise.  Follow lipid panel and liver function tests.    Hyperglycemia Low carb diet and exercise.  Follow met b and a1c.   History of CVA (cerebrovascular accident) On aspirin and plavix.  Follow.  Stable.   Anemia Follow cbc.   Constipation Doing better on miralax and stool softener.    Near syncope Two episodes recently as outlined.  Has seen cardiology. Had holter placed.  Planning to have echo and carotid ultrasound.  W/up in progress.     Stress Increased stress as outlined.  Discussed.  Has good support. Does not feel needs anything more at this time.  Follow.    Orders Placed This Encounter  Procedures  . IBC + Ferritin    Standing Status:   Future    Standing Expiration Date:   11/06/2020  . CBC with Differential/Platelet    Standing Status:   Future    Standing Expiration Date:   11/06/2020  . Hepatic function panel    Standing Status:   Future    Standing Expiration Date:   11/06/2020  . Hemoglobin A1c    Standing Status:   Future    Standing  Expiration Date:   11/06/2020  . Lipid panel    Standing Status:   Future    Standing Expiration Date:   11/06/2020  . TSH    Standing Status:   Future    Standing Expiration Date:   11/06/2020  . Basic metabolic panel    Standing Status:   Future    Standing Expiration Date:   11/06/2020    No orders of the defined types were placed in this encounter.    I discussed the assessment and treatment plan with the patient. The patient was provided an opportunity to ask questions and all were answered. The patient agreed with the plan and demonstrated an understanding of the instructions.   The patient was advised to call back or seek an in-person evaluation if the symptoms worsen or if the condition fails to improve as anticipated.  I provided 25 minutes of non-face-to-face time during this encounter.   Einar Pheasant, MD

## 2019-10-28 NOTE — Telephone Encounter (Signed)
Patient 's daughter called wanted mother to have a telephone visit because she did not want her driving because she could not get off work to bring her to appointment.

## 2019-11-02 ENCOUNTER — Ambulatory Visit (INDEPENDENT_AMBULATORY_CARE_PROVIDER_SITE_OTHER): Payer: Medicare Other

## 2019-11-02 ENCOUNTER — Other Ambulatory Visit: Payer: Self-pay

## 2019-11-02 DIAGNOSIS — I700 Atherosclerosis of aorta: Secondary | ICD-10-CM

## 2019-11-02 DIAGNOSIS — I6523 Occlusion and stenosis of bilateral carotid arteries: Secondary | ICD-10-CM | POA: Diagnosis not present

## 2019-11-02 DIAGNOSIS — R011 Cardiac murmur, unspecified: Secondary | ICD-10-CM | POA: Diagnosis not present

## 2019-11-02 LAB — ECHOCARDIOGRAM COMPLETE
AR max vel: 1.7 cm2
AV Area VTI: 1.67 cm2
AV Area mean vel: 1.61 cm2
AV Mean grad: 11.7 mmHg
AV Peak grad: 22.2 mmHg
Ao pk vel: 2.35 m/s
Area-P 1/2: 2.68 cm2
S' Lateral: 2.3 cm

## 2019-11-07 ENCOUNTER — Encounter: Payer: Self-pay | Admitting: Internal Medicine

## 2019-11-07 DIAGNOSIS — F439 Reaction to severe stress, unspecified: Secondary | ICD-10-CM | POA: Insufficient documentation

## 2019-11-07 DIAGNOSIS — R55 Syncope and collapse: Secondary | ICD-10-CM | POA: Insufficient documentation

## 2019-11-07 DIAGNOSIS — K5900 Constipation, unspecified: Secondary | ICD-10-CM | POA: Insufficient documentation

## 2019-11-07 NOTE — Assessment & Plan Note (Signed)
Follow cbc.  

## 2019-11-07 NOTE — Assessment & Plan Note (Signed)
Increased stress as outlined.  Discussed.  Has good support.  Does not feel needs anything more at this time.  Follow.  

## 2019-11-07 NOTE — Assessment & Plan Note (Signed)
Two episodes recently as outlined.  Has seen cardiology. Had holter placed.  Planning to have echo and carotid ultrasound.  W/up in progress.

## 2019-11-07 NOTE — Assessment & Plan Note (Signed)
Low carb diet and exercise.  Follow met b and a1c.  

## 2019-11-07 NOTE — Assessment & Plan Note (Signed)
On crestor.   

## 2019-11-07 NOTE — Assessment & Plan Note (Signed)
Continue risk factor modification.  Currently asymptomatic.  

## 2019-11-07 NOTE — Assessment & Plan Note (Signed)
On aspirin and plavix.  Follow.  Stable.

## 2019-11-07 NOTE — Assessment & Plan Note (Signed)
S/p thyroidectomy.  On synthroid.  Goal .3 - 2.  Followed by endocrinology.   

## 2019-11-07 NOTE — Assessment & Plan Note (Signed)
Doing better on miralax and stool softener.

## 2019-11-07 NOTE — Assessment & Plan Note (Signed)
On crestor.  Low cholesterol diet and exercise.  Follow lipid panel and liver function tests.   

## 2019-11-07 NOTE — Assessment & Plan Note (Signed)
Blood pressure has been doing well.  On losartan.  Follow pressures. Follow metabolic panel.

## 2019-11-08 ENCOUNTER — Telehealth: Payer: Self-pay | Admitting: Cardiovascular Disease

## 2019-11-08 NOTE — Telephone Encounter (Signed)
Patient daughter wants to review results patient got confused with the results.

## 2019-11-08 NOTE — Telephone Encounter (Signed)
Left voicemail message to call back  

## 2019-11-08 NOTE — Telephone Encounter (Signed)
Spoke with patients daughter per release form. She wanted to review patients results given that her mother could not repeat that information with her. So reviewed testing results to include echo, carotid, and heart monitor. She states that she did see her change to pale color and appears to be faint. She did inquire if it could be inner ear issues, or neurological. Agreed that it could be and to explore those possibilities since our testing did not show anything. She verbalized understanding of our conversation, agreement with plan, and had no further questions at this time.

## 2019-11-10 DIAGNOSIS — Z85850 Personal history of malignant neoplasm of thyroid: Secondary | ICD-10-CM | POA: Diagnosis not present

## 2019-11-10 DIAGNOSIS — E890 Postprocedural hypothyroidism: Secondary | ICD-10-CM | POA: Diagnosis not present

## 2019-11-17 ENCOUNTER — Other Ambulatory Visit: Payer: Self-pay | Admitting: Internal Medicine

## 2019-11-17 DIAGNOSIS — Z85850 Personal history of malignant neoplasm of thyroid: Secondary | ICD-10-CM | POA: Diagnosis not present

## 2019-11-17 DIAGNOSIS — E559 Vitamin D deficiency, unspecified: Secondary | ICD-10-CM | POA: Diagnosis not present

## 2019-11-17 DIAGNOSIS — E890 Postprocedural hypothyroidism: Secondary | ICD-10-CM | POA: Diagnosis not present

## 2019-11-17 NOTE — Telephone Encounter (Signed)
rx sent in for temazepam.

## 2019-11-28 NOTE — Progress Notes (Signed)
Cardiology Office Note  Date:  11/29/2019   ID:  WESTLYN GLAZA, DOB 24-Jul-1927, MRN [id number]  PCP:  Einar Pheasant, MD   Chief Complaint  Patient presents with   Follow-up    Cardiac testing. Meds reviewed by the pt. verbally. "doing well."     HPI:  Tamara [name] is a very pleasant 86 year old woman with Hyperlipidemia, hypertension,   Previous Stroke August 2014 , could not write,right hand, problem with numbers Mild carotid plaque bilaterally Depression mild to moderate diffuse aorta and coronary plaquing/atherosclerosis on CT CAD on CT scan She presents today for follow-up of her blood pressure and history of stroke  Recent studies reviewed with her Carotid ultrasound October 2021 Less than 39% disease bilaterally  echocardiogram October 2021  normal ejection fraction mild AS  ZIO monitor normal sinus rhythm Triggered events not associated with significant arrhythmia Rare narrow complex tachycardia longest was 11 beats  In follow-up she presents with her daughter, daughter has recovered from lung surgery Dog with cancer, had to put it down Very sad Continues to have neuropathy Previously treated with Lyrica, gabapentin  Vision problem, trouble driving Chronic knee pain, declined surgery Daughter helps with ADLs  Prior history of thyroidectomy Developed effusion post surgery, required drainage  Tolerating Crestor  EKG personally reviewed by myself on todays visit Shows Normal sinus rhythm with rate 62 bpm right bundle branch block  Other past medical history reviewed History of diverticulitis, went to the emergency room twice, was given Cipro and Flagyl Family reports that she had a reaction to the Flagy  CT scan  showed mild to moderate diffuse aorta and coronary plaquing/atherosclerosis.   admitted to the hospital August 25 and discharged 09/22/2012. Diagnosis of embolic infarct in the left parietal area with right hand paresthesias and expressive  aphasia.  no documented atrial fibrillation though significant concern given history of fluttering and palpitations.   30 day Holter did not show any significant arrhythmia  residual deficits have returned to normal.  On aspirin and Plavix with no further symptoms  Echocardiogram 09/22/2012 shows ejection fraction 55-60% mild mild MR and TR, normal right ventricular systolic pressures bubble study was not performed to look for PFO EKG in the hospital showed normal sinus rhythm with rate 61 beats per minute, essentially normal MRI of the brain showed abnormal signal in the left high posterior parietal region consistent with acute ischemia Carotid ultrasound showed no hemodynamic significance. There is calcified mural plaque identified on the right and left, less than 50%   PMH:   has a past medical history of Anemia, Arthritis, Chicken pox, Colitis, Degenerative joint disease, Diverticulitis, Diverticulosis, Fibrocystic breast disease, GERD (gastroesophageal reflux disease), Glaucoma, Heart murmur, Helicobacter pylori ab+, History of depression, History of skin cancer, Hypercholesterolemia, Hypertension, IBS (irritable bowel syndrome), Internal hemorrhoids, Migraines, Osteopenia, Stroke ([place]), Thyroid disease, Tubular adenoma of colon, and Vitamin D deficiency.  PSH:    Past Surgical History:  Procedure Laterality Date   ABDOMINAL HYSTERECTOMY  1973   secondary to bleeding   APPENDECTOMY     BILATERAL SALPINGOOPHORECTOMY  2004   complex ovarian cyst (left) - benign   BREAST BIOPSY Bilateral    x4- neg   MANDIBLE FRACTURE SURGERY     VENTRAL HERNIA REPAIR  2005   Dr. Tamala Julian    Current Outpatient Medications  Medication Sig Dispense Refill   Apple Cider Vinegar 500 MG TABS Take 500 mg by mouth daily.     aspirin 81 MG tablet Take 81 mg  by mouth daily.     clopidogrel (PLAVIX) 75 MG tablet Take 1 tablet (75 mg total) by mouth daily. 90 tablet 3   gabapentin (NEURONTIN) 100  MG capsule Take 100 mg by mouth 2 (two) times daily.     levothyroxine (SYNTHROID, LEVOTHROID) 75 MCG tablet Take 75 mcg by mouth daily.     losartan (COZAAR) 50 MG tablet TAKE 1 TABLET EVERY DAY 90 tablet 1   meclizine (ANTIVERT) 25 MG tablet Take 25 mg by mouth 3 (three) times daily as needed.     PARoxetine (PAXIL) 30 MG tablet TAKE 1 TABLET EVERY DAY 90 tablet 1   polyethylene glycol (MIRALAX / GLYCOLAX) packet Take 17 g by mouth daily as needed for mild constipation.     pregabalin (LYRICA) 100 MG capsule TAKE 100MG  IN THE MORNING AND 50MG  AT NIGHT FOR ONE WEEK, THEN INCREASE TO 100MG  TWICE DAILY AS DIRECTED     rosuvastatin (CRESTOR) 40 MG tablet TAKE 1 TABLET (40 MG TOTAL) BY MOUTH DAILY. 90 tablet 2   temazepam (RESTORIL) 15 MG capsule TAKE 1 CAPSULE AT BEDTIME AS NEEDED 30 capsule 0   ibuprofen (ADVIL) 200 MG tablet Take by mouth.     No current facility-administered medications for this visit.     Allergies:   Flagyl [metronidazole], Omnicef [cefdinir], Other, Prednisone, Shrimp [shellfish allergy], Tylenol [acetaminophen], Vicodin [hydrocodone-acetaminophen], and Ibuprofen   Social History:  The patient  reports that she has never smoked. She has never used smokeless tobacco. She reports that she does not drink alcohol and does not use drugs.   Family History:   family history includes Cirrhosis in her brother; Colon cancer in her sister; Esophageal cancer in her mother; Heart attack in her son; Heart disease in her father; Migraines in her daughter; Ovarian cancer in her sister; Parkinson's disease in her brother; Stomach cancer in her mother.    Review of Systems: Review of Systems  Constitutional: Negative.   HENT: Negative.   Respiratory: Negative.   Cardiovascular: Negative.   Gastrointestinal: Negative.   Musculoskeletal: Negative.   Neurological: Negative.   Psychiatric/Behavioral: Negative.   All other systems reviewed and are negative.   PHYSICAL  EXAM: VS:  BP 136/68 (BP Location: Left Arm, Patient Position: Sitting, Cuff Size: Normal)    Pulse 62    Ht 5\' 3"  (1.6 m)    Wt 139 lb 8 oz (63.3 kg)    LMP 01/17/1972    SpO2 97%    BMI 24.71 kg/m  , BMI Body mass index is 24.71 kg/m. Constitutional:  oriented to person, place, and time. No distress.  HENT:  Head: Grossly normal Eyes:  no discharge. No scleral icterus.  Neck: No JVD, no carotid bruits  Cardiovascular: Regular rate and rhythm, no murmurs appreciated Pulmonary/Chest: Clear to auscultation bilaterally, no wheezes or rails Abdominal: Soft.  no distension.  no tenderness.  Musculoskeletal: Normal range of motion Neurological:  normal muscle tone. Coordination normal. No atrophy Skin: Skin warm and dry Psychiatric: normal affect, pleasant  Recent Labs: 12/16/2018: ALT 10 10/04/2019: BUN 19; Creatinine, Ser 0.93; Hemoglobin 10.9; Platelets 243; Potassium 3.8; Sodium 138    Lipid Panel Lab Results  Component Value Date   CHOL 190 04/13/2018   HDL 74.00 04/13/2018   LDLCALC 98 04/13/2018   TRIG 90.0 04/13/2018     Wt Readings from Last 3 Encounters:  11/29/19 139 lb 8 oz (63.3 kg)  10/06/19 139 lb 9.6 oz (63.3 kg)  10/04/19 136 lb  14.5 oz (62.1 kg)     ASSESSMENT AND PLAN:  Essential hypertension Blood pressure is well controlled on today's visit. No changes made to the medications.  Hypercholesterolemia Repeat lipid panel pending through primary care Continue statin   Cerebrovascular accident (CVA), unspecified mechanism ([place]) tolerating aspirin and Plavix  Prior stroke details discussed with her , back in 2014  Denies any recent TIA or stroke symptoms As above, repeat lipid panel pending  Bilateral carotid artery stenosis Smooth plaque bilaterally, less than 39%  No further testing needed No progression over quite some years  Aortic atherosclerosis ([place]) no need for routine surveillance On Crestor  Coronary artery disease involving native  coronary artery of native heart without angina pectoris Currently with no symptoms of angina. No further workup at this time. Continue current medication regimen.  Thyroidectomy History of thyroid cancer Recovered   Total encounter time more than 25 minutes  Greater than 50% was spent in counseling and coordination of care with the patient   Signed, Esmond Plants, M.D., Ph.D. 11/29/2019  [place], [place]

## 2019-11-29 ENCOUNTER — Other Ambulatory Visit: Payer: Self-pay

## 2019-11-29 ENCOUNTER — Ambulatory Visit (INDEPENDENT_AMBULATORY_CARE_PROVIDER_SITE_OTHER): Payer: Medicare Other | Admitting: Cardiovascular Disease

## 2019-11-29 ENCOUNTER — Encounter: Payer: Self-pay | Admitting: Cardiovascular Disease

## 2019-11-29 VITALS — BP 136/68 | HR 62 | Ht 63.0 in | Wt 139.5 lb

## 2019-11-29 DIAGNOSIS — I700 Atherosclerosis of aorta: Secondary | ICD-10-CM | POA: Diagnosis not present

## 2019-11-29 DIAGNOSIS — I6523 Occlusion and stenosis of bilateral carotid arteries: Secondary | ICD-10-CM

## 2019-11-29 DIAGNOSIS — I10 Essential (primary) hypertension: Secondary | ICD-10-CM

## 2019-11-29 DIAGNOSIS — I639 Cerebral infarction, unspecified: Secondary | ICD-10-CM

## 2019-11-29 DIAGNOSIS — R0602 Shortness of breath: Secondary | ICD-10-CM | POA: Diagnosis not present

## 2019-11-29 DIAGNOSIS — R55 Syncope and collapse: Secondary | ICD-10-CM | POA: Diagnosis not present

## 2019-11-29 DIAGNOSIS — E7800 Pure hypercholesterolemia, unspecified: Secondary | ICD-10-CM | POA: Diagnosis not present

## 2019-11-29 DIAGNOSIS — I739 Peripheral vascular disease, unspecified: Secondary | ICD-10-CM | POA: Diagnosis not present

## 2019-11-29 NOTE — Patient Instructions (Signed)
Medication Instructions:  No changes  If you need a refill on your cardiac medications before your next appointment, please call your pharmacy.    Lab work: No new labs needed   If you have labs (blood work) drawn today and your tests are completely normal, you will receive your results only by: . MyChart Message (if you have MyChart) OR . A paper copy in the mail If you have any lab test that is abnormal or we need to change your treatment, we will call you to review the results.   Testing/Procedures: No new testing needed   Follow-Up: At [facility], you and your health needs are our priority.  As part of our continuing mission to provide you with exceptional heart care, we have created designated Provider Care Teams.  These Care Teams include your primary Cardiologist (physician) and Advanced Practice Providers (APPs -  Physician Assistants and Nurse Practitioners) who all work together to provide you with the care you need, when you need it.  . You will need a follow up appointment in 12 months  . Providers on your designated Care Team:   . Christopher Berge, NP . Ryan Dunn, PA-C . Jacquelyn Visser, PA-C  Any Other Special Instructions Will Be Listed Below (If Applicable).  COVID-19 Vaccine Information can be found at: [email or web address] For questions related to vaccine distribution or appointments, please email [email or web address] or call [phone].     

## 2019-12-08 ENCOUNTER — Other Ambulatory Visit: Payer: Self-pay | Admitting: Internal Medicine

## 2020-01-03 ENCOUNTER — Other Ambulatory Visit: Payer: Self-pay | Admitting: Internal Medicine

## 2020-02-09 ENCOUNTER — Other Ambulatory Visit: Payer: Self-pay | Admitting: Internal Medicine

## 2020-02-09 NOTE — Telephone Encounter (Signed)
rx ok'd for temazepam #30 with no refills.  Needs f/u appt scheduled.

## 2020-02-11 NOTE — Telephone Encounter (Signed)
Please schedule her for a follow up.

## 2020-02-11 NOTE — Telephone Encounter (Signed)
Called and scheduled

## 2020-03-13 DIAGNOSIS — Z7902 Long term (current) use of antithrombotics/antiplatelets: Secondary | ICD-10-CM | POA: Diagnosis not present

## 2020-03-13 DIAGNOSIS — R102 Pelvic and perineal pain: Secondary | ICD-10-CM | POA: Diagnosis not present

## 2020-03-13 DIAGNOSIS — L292 Pruritus vulvae: Secondary | ICD-10-CM | POA: Diagnosis not present

## 2020-03-13 DIAGNOSIS — Z885 Allergy status to narcotic agent status: Secondary | ICD-10-CM | POA: Diagnosis not present

## 2020-03-13 DIAGNOSIS — E785 Hyperlipidemia, unspecified: Secondary | ICD-10-CM | POA: Diagnosis not present

## 2020-03-13 DIAGNOSIS — Z7982 Long term (current) use of aspirin: Secondary | ICD-10-CM | POA: Diagnosis not present

## 2020-03-13 DIAGNOSIS — N904 Leukoplakia of vulva: Secondary | ICD-10-CM | POA: Diagnosis not present

## 2020-03-13 DIAGNOSIS — I10 Essential (primary) hypertension: Secondary | ICD-10-CM | POA: Diagnosis not present

## 2020-03-13 DIAGNOSIS — Z886 Allergy status to analgesic agent status: Secondary | ICD-10-CM | POA: Diagnosis not present

## 2020-03-13 DIAGNOSIS — K589 Irritable bowel syndrome without diarrhea: Secondary | ICD-10-CM | POA: Diagnosis not present

## 2020-03-28 ENCOUNTER — Other Ambulatory Visit (INDEPENDENT_AMBULATORY_CARE_PROVIDER_SITE_OTHER): Payer: Medicare Other

## 2020-03-28 ENCOUNTER — Other Ambulatory Visit: Payer: Self-pay

## 2020-03-28 DIAGNOSIS — R739 Hyperglycemia, unspecified: Secondary | ICD-10-CM | POA: Diagnosis not present

## 2020-03-28 DIAGNOSIS — I10 Essential (primary) hypertension: Secondary | ICD-10-CM | POA: Diagnosis not present

## 2020-03-28 DIAGNOSIS — Z85850 Personal history of malignant neoplasm of thyroid: Secondary | ICD-10-CM | POA: Diagnosis not present

## 2020-03-28 DIAGNOSIS — D649 Anemia, unspecified: Secondary | ICD-10-CM | POA: Diagnosis not present

## 2020-03-28 DIAGNOSIS — E785 Hyperlipidemia, unspecified: Secondary | ICD-10-CM

## 2020-03-28 LAB — BASIC METABOLIC PANEL
BUN: 13 mg/dL (ref 6–23)
CO2: 30 mEq/L (ref 19–32)
Calcium: 9.2 mg/dL (ref 8.4–10.5)
Chloride: 102 mEq/L (ref 96–112)
Creatinine, Ser: 0.82 mg/dL (ref 0.40–1.20)
GFR: 63.08 mL/min (ref >60.00)
Glucose, Bld: 92 mg/dL (ref 70–99)
Potassium: 4.4 mEq/L (ref 3.5–5.1)
Sodium: 140 mEq/L (ref 135–145)

## 2020-03-28 LAB — IBC + FERRITIN
Ferritin: 12.6 ng/mL (ref 10.0–291.0)
Iron: 77 ug/dL (ref 42–145)
Saturation Ratios: 19.7 % — ABNORMAL LOW (ref 20.0–50.0)
Transferrin: 279 mg/dL (ref 212.0–360.0)

## 2020-03-28 LAB — CBC WITH DIFFERENTIAL/PLATELET
Basophils Absolute: 0 10*3/uL (ref 0.0–0.1)
Basophils Relative: 0.6 % (ref 0.0–3.0)
Eosinophils Absolute: 0 10*3/uL (ref 0.0–0.7)
Eosinophils Relative: 0.7 % (ref 0.0–5.0)
HCT: 35.5 % — ABNORMAL LOW (ref 36.0–46.0)
Hemoglobin: 12 g/dL (ref 12.0–15.0)
Lymphocytes Relative: 25.9 % (ref 12.0–46.0)
Lymphs Abs: 1.1 10*3/uL (ref 0.7–4.0)
MCHC: 33.8 g/dL (ref 30.0–36.0)
MCV: 84.7 fl (ref 78.0–100.0)
Monocytes Absolute: 0.3 10*3/uL (ref 0.1–1.0)
Monocytes Relative: 7.9 % (ref 3.0–12.0)
Neutro Abs: 2.8 10*3/uL (ref 1.4–7.7)
Neutrophils Relative %: 64.9 % (ref 43.0–77.0)
Platelets: 280 10*3/uL (ref 150.0–400.0)
RBC: 4.2 Mil/uL (ref 3.87–5.11)
RDW: 15 % (ref 11.5–15.5)
WBC: 4.3 10*3/uL (ref 4.0–10.5)

## 2020-03-28 LAB — TSH: TSH: 1.23 u[IU]/mL (ref 0.35–4.50)

## 2020-03-28 LAB — HEPATIC FUNCTION PANEL
ALT: 14 U/L (ref 0–35)
AST: 21 U/L (ref 0–37)
Albumin: 4.2 g/dL (ref 3.5–5.2)
Alkaline Phosphatase: 64 U/L (ref 39–117)
Bilirubin, Direct: 0.1 mg/dL (ref 0.0–0.3)
Total Bilirubin: 0.5 mg/dL (ref 0.2–1.2)
Total Protein: 6.8 g/dL (ref 6.0–8.3)

## 2020-03-28 LAB — LIPID PANEL
Cholesterol: 199 mg/dL (ref 0–200)
HDL: 74.3 mg/dL (ref >39.00)
LDL Cholesterol: 105 mg/dL — ABNORMAL HIGH (ref 0–99)
NonHDL: 125.11
Total CHOL/HDL Ratio: 3
Triglycerides: 100 mg/dL (ref 0.0–149.0)
VLDL: 20 mg/dL (ref 0.0–40.0)

## 2020-03-28 LAB — HEMOGLOBIN A1C: Hgb A1c MFr Bld: 5.9 % (ref 4.6–6.5)

## 2020-04-06 ENCOUNTER — Other Ambulatory Visit: Payer: Self-pay | Admitting: Internal Medicine

## 2020-04-13 ENCOUNTER — Ambulatory Visit (INDEPENDENT_AMBULATORY_CARE_PROVIDER_SITE_OTHER): Payer: Medicare Other | Admitting: Internal Medicine

## 2020-04-13 ENCOUNTER — Other Ambulatory Visit: Payer: Self-pay

## 2020-04-13 DIAGNOSIS — I2510 Atherosclerotic heart disease of native coronary artery without angina pectoris: Secondary | ICD-10-CM | POA: Diagnosis not present

## 2020-04-13 DIAGNOSIS — I700 Atherosclerosis of aorta: Secondary | ICD-10-CM

## 2020-04-13 DIAGNOSIS — Z85850 Personal history of malignant neoplasm of thyroid: Secondary | ICD-10-CM

## 2020-04-13 DIAGNOSIS — Z8673 Personal history of transient ischemic attack (TIA), and cerebral infarction without residual deficits: Secondary | ICD-10-CM

## 2020-04-13 DIAGNOSIS — D649 Anemia, unspecified: Secondary | ICD-10-CM

## 2020-04-13 DIAGNOSIS — R739 Hyperglycemia, unspecified: Secondary | ICD-10-CM

## 2020-04-13 DIAGNOSIS — I6523 Occlusion and stenosis of bilateral carotid arteries: Secondary | ICD-10-CM

## 2020-04-13 DIAGNOSIS — E7800 Pure hypercholesterolemia, unspecified: Secondary | ICD-10-CM

## 2020-04-13 DIAGNOSIS — I10 Essential (primary) hypertension: Secondary | ICD-10-CM

## 2020-04-13 MED ORDER — TEMAZEPAM 15 MG PO CAPS: ORAL_CAPSULE | ORAL | 0 refills | Status: DC

## 2020-04-13 NOTE — Progress Notes (Signed)
Patient ID: Tamara [name], female   DOB: 31-Dec-1927, 87 y.o.   MRN: [id number]   Subjective:    Patient ID: Tamara [name], female    DOB: 1927-06-07, 87 y.o.   MRN: [id number]  HPI This visit occurred during the SARS-CoV-2 public health emergency.  Safety protocols were in place, including screening questions prior to the visit, additional usage of staff PPE, and extensive cleaning of exam room while observing appropriate contact time as indicated for disinfecting solutions.  Patient here for a scheduled follow up.  She is accompanied by her daughter.  history obtained from both of them.  Recently has had problems with vulvar pain.  Seeing GYN.  S/p biopsy.  Pain better now.  Tries to stay active.  Able to get around her house and do ADLs, etc.  No recent falls.  Did fall months ago, but no residual problems from the fall.  Denies being unsteady.  No headache.  No dizziness.  No chest pain or sob reported.  Eating.  No abdominal pain.  Bowels moving. Daughter had pneumonia recently.  Tamara [name] was sick last week.  No symptoms now.  Followed by neurology - f/u burning foot and leg.  On gabapentin.  Appears to be stable.  Followed by ophthalmology - macular degeneration.    Past Medical History:  Diagnosis Date  . Anemia   . Arthritis   . Chicken pox   . Colitis   . Degenerative joint disease   . Diverticulitis   . Diverticulosis   . Fibrocystic breast disease   . GERD (gastroesophageal reflux disease)    EGD - gastritis/mild duodenitis, previous positive H. pylori  . Glaucoma   . Heart murmur   . Helicobacter pylori ab+   . History of depression   . History of skin cancer    facial  . Hypercholesterolemia   . Hypertension   . IBS (irritable bowel syndrome)   . Internal hemorrhoids   . Migraines   . Osteopenia   . Stroke (H[facility])   . Thyroid disease   . Tubular adenoma of colon   . Vitamin D deficiency    Past Surgical History:  Procedure Laterality Date  . ABDOMINAL  HYSTERECTOMY  1973   secondary to bleeding  . APPENDECTOMY    . BILATERAL SALPINGOOPHORECTOMY  2004   complex ovarian cyst (left) - benign  . BREAST BIOPSY Bilateral    x4- neg  . MANDIBLE FRACTURE SURGERY    . VENTRAL HERNIA REPAIR  2005   Dr. [name]   Family History  Problem Relation Age of Onset  . Esophageal cancer Mother   . Stomach cancer Mother   . Heart disease Father   . Cirrhosis Brother   . Parkinson's disease Brother   . Ovarian cancer Sister   . Colon cancer Sister        half-sister  . Migraines Daughter   . Heart attack Son   . Breast cancer Neg Hx    Social History   Socioeconomic History  . Marital status: Widowed    Spouse name: Not on file  . Number of children: 2  . Years of education: Not on file  . Highest education level: Not on file  Occupational History  . Occupation: retired  Tobacco Use  . Smoking status: Never Smoker  . Smokeless tobacco: Never Used  Vaping Use  . Vaping Use: Never used  Substance and Sexual Activity  . Alcohol use: No    Alcohol/week: 0.0  standard drinks  . Drug use: No  . Sexual activity: Never  Other Topics Concern  . Not on file  Social History Narrative  . Not on file   Social Determinants of Health   Financial Resource Strain: Not on file  Food Insecurity: Not on file  Transportation Needs: Not on file  Physical Activity: Not on file  Stress: Not on file  Social Connections: Not on file    Outpatient Encounter Medications as of 04/13/2020  Medication Sig  . Apple Cider Vinegar 500 MG TABS Take 500 mg by mouth daily.  [name] aspirin 81 MG tablet Take 81 mg by mouth daily.  . clopidogrel (PLAVIX) 75 MG tablet Take 1 tablet (75 mg total) by mouth daily.  [name] gabapentin (NEURONTIN) 100 MG capsule Take 100 mg by mouth 2 (two) times daily.  [name] ibuprofen (ADVIL) 200 MG tablet Take by mouth.  . levothyroxine (SYNTHROID, LEVOTHROID) 75 MCG tablet Take 75 mcg by mouth daily.  [name] losartan (COZAAR) 50 MG tablet TAKE 1  TABLET EVERY DAY  . meclizine (ANTIVERT) 25 MG tablet Take 25 mg by mouth 3 (three) times daily as needed.  [name] PARoxetine (PAXIL) 30 MG tablet TAKE 1 TABLET EVERY DAY  . polyethylene glycol (MIRALAX / GLYCOLAX) packet Take 17 g by mouth daily as needed for mild constipation.  . pregabalin (LYRICA) 100 MG capsule TAKE $RemoveBefo'100MG'BMvvEYaHJBg$  IN THE MORNING AND $RemoveBef'50MG'omsobaRTCe$  AT NIGHT FOR ONE WEEK, THEN INCREASE TO $RemoveBef'100MG'jzncUcolCg$  TWICE DAILY AS DIRECTED  . rosuvastatin (CRESTOR) 40 MG tablet TAKE 1 TABLET (40 MG TOTAL) BY MOUTH DAILY.  [name] temazepam (RESTORIL) 15 MG capsule TAKE 1 CAPSULE AT BEDTIME AS NEEDED  . [DISCONTINUED] temazepam (RESTORIL) 15 MG capsule TAKE 1 CAPSULE AT BEDTIME AS NEEDED   No facility-administered encounter medications on file as of 04/13/2020.    Review of Systems  Constitutional: Negative for appetite change and unexpected weight change.  HENT: Negative for congestion and sinus pressure.   Respiratory: Negative for cough, chest tightness and shortness of breath.   Cardiovascular: Negative for chest pain, palpitations and leg swelling.  Gastrointestinal: Negative for abdominal pain, diarrhea, nausea and vomiting.  Genitourinary: Negative for difficulty urinating and dysuria.  Musculoskeletal: Negative for joint swelling and myalgias.  Skin: Negative for color change and rash.  Neurological: Negative for dizziness, light-headedness and headaches.  Psychiatric/Behavioral: Negative for agitation and dysphoric mood.       Objective:    Physical Exam Vitals reviewed.  Constitutional:      General: She is not in acute distress.    Appearance: Normal appearance.  HENT:     Head: Normocephalic and atraumatic.     Right Ear: External ear normal.     Left Ear: External ear normal.  Eyes:     General: No scleral icterus.       Right eye: No discharge.        Left eye: No discharge.     Conjunctiva/sclera: Conjunctivae normal.  Neck:     Thyroid: No thyromegaly.  Cardiovascular:     Rate and Rhythm:  Normal rate and regular rhythm.  Pulmonary:     Effort: No respiratory distress.     Breath sounds: Normal breath sounds. No wheezing.  Abdominal:     General: Bowel sounds are normal.     Palpations: Abdomen is soft.     Tenderness: There is no abdominal tenderness.  Musculoskeletal:        General: No swelling or tenderness.  Cervical back: Neck supple. No tenderness.  Lymphadenopathy:     Cervical: No cervical adenopathy.  Skin:    Findings: No erythema or rash.  Neurological:     Mental Status: She is alert.  Psychiatric:        Mood and Affect: Mood normal.        Behavior: Behavior normal.     BP 128/70   Pulse 71   Temp 98.1 F (36.7 C) (Oral)   Resp 16   Ht $R'5\' 3"'SQ$  (1.6 m)   Wt 140 lb 3.2 oz (63.6 kg)   LMP 01/17/1972   SpO2 97%   BMI 24.84 kg/m  Wt Readings from Last 3 Encounters:  04/13/20 140 lb 3.2 oz (63.6 kg)  11/29/19 139 lb 8 oz (63.3 kg)  10/06/19 139 lb 9.6 oz (63.3 kg)     Lab Results  Component Value Date   WBC 4.3 03/28/2020   HGB 12.0 03/28/2020   HCT 35.5 (L) 03/28/2020   PLT 280.0 03/28/2020   GLUCOSE 92 03/28/2020   CHOL 199 03/28/2020   TRIG 100.0 03/28/2020   HDL 74.30 03/28/2020   LDLDIRECT 177.7 01/19/2013   LDLCALC 105 (H) 03/28/2020   ALT 14 03/28/2020   AST 21 03/28/2020   NA 140 03/28/2020   K 4.4 03/28/2020   CL 102 03/28/2020   CREATININE 0.82 03/28/2020   BUN 13 03/28/2020   CO2 30 03/28/2020   TSH 1.23 03/28/2020   HGBA1C 5.9 03/28/2020       Assessment & Plan:   Problem List Items Addressed This Visit    Anemia    Follow cbc.       Aortic atherosclerosis (HCC)    Continue crestor.       CAD (coronary artery disease)    Continue risk factor modification.  Currently asymptomatic.       Carotid stenosis    Continue antiplatelet therapy.       History of CVA (cerebrovascular accident)    Continue aspirin and plavix.  Follow.       History of thyroid cancer    S/p thyroidectomy.  On synthroid.   Goal .3 - 2.  Followed by endocrinology.        Hypercholesterolemia    On crestor.  Low cholesterol diet and exercise.  Follow lipid panel and liver function tests.        Hyperglycemia    Low carb diet and exercise.  Follow met b and a1c.       Hypertension    Continue losartan.  Blood pressure doing well.  Follow pressures.  Follow metabolic panel.           Einar Pheasant, MD

## 2020-04-14 ENCOUNTER — Encounter: Payer: Self-pay | Admitting: Internal Medicine

## 2020-04-19 DIAGNOSIS — D8489 Other immunodeficiencies: Secondary | ICD-10-CM | POA: Diagnosis not present

## 2020-04-22 ENCOUNTER — Encounter: Payer: Self-pay | Admitting: Internal Medicine

## 2020-04-22 NOTE — Assessment & Plan Note (Signed)
Continue antiplatelet therapy.

## 2020-04-22 NOTE — Assessment & Plan Note (Signed)
Follow cbc.  

## 2020-04-22 NOTE — Assessment & Plan Note (Signed)
Continue crestor 

## 2020-04-22 NOTE — Assessment & Plan Note (Signed)
Continue risk factor modification.  Currently asymptomatic.

## 2020-04-22 NOTE — Assessment & Plan Note (Signed)
Continue aspirin and plavix.  Follow.

## 2020-04-22 NOTE — Assessment & Plan Note (Signed)
Low carb diet and exercise.  Follow met b and a1c.  

## 2020-04-22 NOTE — Assessment & Plan Note (Signed)
Continue losartan.  Blood pressure doing well.  Follow pressures.  Follow metabolic panel.  

## 2020-04-22 NOTE — Assessment & Plan Note (Signed)
On crestor.  Low cholesterol diet and exercise.  Follow lipid panel and liver function tests.   

## 2020-04-22 NOTE — Assessment & Plan Note (Signed)
S/p thyroidectomy.  On synthroid.  Goal .3 - 2.  Followed by endocrinology.

## 2020-05-11 DIAGNOSIS — E559 Vitamin D deficiency, unspecified: Secondary | ICD-10-CM | POA: Diagnosis not present

## 2020-05-11 DIAGNOSIS — Z85850 Personal history of malignant neoplasm of thyroid: Secondary | ICD-10-CM | POA: Diagnosis not present

## 2020-05-11 DIAGNOSIS — E890 Postprocedural hypothyroidism: Secondary | ICD-10-CM | POA: Diagnosis not present

## 2020-05-15 ENCOUNTER — Other Ambulatory Visit: Payer: Self-pay | Admitting: Internal Medicine

## 2020-05-15 DIAGNOSIS — H353131 Nonexudative age-related macular degeneration, bilateral, early dry stage: Secondary | ICD-10-CM | POA: Diagnosis not present

## 2020-05-17 DIAGNOSIS — E890 Postprocedural hypothyroidism: Secondary | ICD-10-CM | POA: Diagnosis not present

## 2020-05-17 DIAGNOSIS — Z85850 Personal history of malignant neoplasm of thyroid: Secondary | ICD-10-CM | POA: Diagnosis not present

## 2020-05-17 DIAGNOSIS — E559 Vitamin D deficiency, unspecified: Secondary | ICD-10-CM | POA: Diagnosis not present

## 2020-05-31 ENCOUNTER — Ambulatory Visit (INDEPENDENT_AMBULATORY_CARE_PROVIDER_SITE_OTHER): Payer: Medicare Other | Admitting: Adult Health

## 2020-05-31 ENCOUNTER — Encounter: Payer: Self-pay | Admitting: Adult Health

## 2020-05-31 ENCOUNTER — Other Ambulatory Visit: Payer: Self-pay

## 2020-05-31 ENCOUNTER — Ambulatory Visit (INDEPENDENT_AMBULATORY_CARE_PROVIDER_SITE_OTHER): Payer: Medicare Other

## 2020-05-31 VITALS — BP 140/70 | HR 63 | Temp 97.0°F | Ht 62.99 in | Wt 140.0 lb

## 2020-05-31 DIAGNOSIS — M1909 Primary osteoarthritis, other specified site: Secondary | ICD-10-CM

## 2020-05-31 DIAGNOSIS — M25551 Pain in right hip: Secondary | ICD-10-CM | POA: Diagnosis not present

## 2020-05-31 DIAGNOSIS — K5900 Constipation, unspecified: Secondary | ICD-10-CM

## 2020-05-31 DIAGNOSIS — G629 Polyneuropathy, unspecified: Secondary | ICD-10-CM | POA: Diagnosis not present

## 2020-05-31 DIAGNOSIS — G8929 Other chronic pain: Secondary | ICD-10-CM

## 2020-05-31 DIAGNOSIS — M545 Low back pain, unspecified: Secondary | ICD-10-CM | POA: Diagnosis not present

## 2020-05-31 DIAGNOSIS — M160 Bilateral primary osteoarthritis of hip: Secondary | ICD-10-CM | POA: Diagnosis not present

## 2020-05-31 DIAGNOSIS — M533 Sacrococcygeal disorders, not elsewhere classified: Secondary | ICD-10-CM | POA: Diagnosis not present

## 2020-05-31 DIAGNOSIS — R109 Unspecified abdominal pain: Secondary | ICD-10-CM | POA: Diagnosis not present

## 2020-05-31 DIAGNOSIS — M549 Dorsalgia, unspecified: Secondary | ICD-10-CM

## 2020-05-31 MED ORDER — CYCLOBENZAPRINE HCL 5 MG PO TABS: 5.0000 mg | ORAL_TABLET | Freq: Every day | ORAL | 0 refills | Status: AC

## 2020-05-31 NOTE — Progress Notes (Signed)
Acute Office Visit  Subjective:    Patient ID: Tamara [name], female    DOB: September 21, 1927, 87 y.o.   MRN: [id number]  Chief Complaint  Patient presents with  . Hip Pain    Pt c/o right sided hip, foot, and flank pain. Pt found a knot near old Hernia location.    HPI Patient is in today for right sided hip pain, onset was 05/27/20 radiating to the right great toe.  She has a history of neuropathy in legs. Gabapentin seemed to help neuropathy.  Bowel movements have been normal " for me ". , with colace.  Denies any trauma or injury.    History of 2 right lower sided hernia feels those " a little more recently" " Bowel movement this morning but not as much as she would like to have". Denies any bleeding or dark stools.   Denies any falls or injuries. She did rake yard for two days after she was already having lower right sided back pain/ hip pain. This seemed to worsen it. The pain feels " tight " in right hip muscle and a few weeks ago she reports she did feel grinding in right hip.   History of full hysterectomy in the past per patient.  Patient  denies any fever, body aches,chills, rash, chest pain, shortness of breath, nausea, vomiting, or diarrhea.   Denies dizziness, lightheadedness, pre syncopal or syncopal episodes.  Allergies  Allergen Reactions  . Flagyl [Metronidazole]     Throat burning   . Omnicef [Cefdinir]     Throat burning  . Other     Steroids "all steroids causes burning in esophagus and stomach"  . Prednisone Other (See Comments)    inflammed esophagus  . Shrimp [Shellfish Allergy]     "sick on stomach"  . Tylenol [Acetaminophen] Other (See Comments)    Rash with high dose   . Vicodin [Hydrocodone-Acetaminophen] Nausea And Vomiting  . Amoxicillin Anxiety  . Ibuprofen Rash    Can't take high doses     Past Medical History:  Diagnosis Date  . Anemia   . Arthritis   . Chicken pox   . Colitis   . Degenerative joint disease   . Diverticulitis   .  Diverticulosis   . Fibrocystic breast disease   . GERD (gastroesophageal reflux disease)    EGD - gastritis/mild duodenitis, previous positive H. pylori  . Glaucoma   . Heart murmur   . Helicobacter pylori ab+   . History of depression   . History of skin cancer    facial  . Hypercholesterolemia   . Hypertension   . IBS (irritable bowel syndrome)   . Internal hemorrhoids   . Migraines   . Osteopenia   . Stroke ([place])   . Thyroid disease   . Tubular adenoma of colon   . Vitamin D deficiency     Past Surgical History:  Procedure Laterality Date  . ABDOMINAL HYSTERECTOMY  1973   secondary to bleeding  . APPENDECTOMY    . BILATERAL SALPINGOOPHORECTOMY  2004   complex ovarian cyst (left) - benign  . BREAST BIOPSY Bilateral    x4- neg  . MANDIBLE FRACTURE SURGERY    . VENTRAL HERNIA REPAIR  2005   Dr. Tamala Julian    Family History  Problem Relation Age of Onset  . Esophageal cancer Mother   . Stomach cancer Mother   . Heart disease Father   . Cirrhosis Brother   . Parkinson's disease Brother   .  Ovarian cancer Sister   . Colon cancer Sister        half-sister  . Migraines Daughter   . Heart attack Son   . Breast cancer Neg Hx     Social History   Socioeconomic History  . Marital status: Widowed    Spouse name: Not on file  . Number of children: 2  . Years of education: Not on file  . Highest education level: Not on file  Occupational History  . Occupation: retired  Tobacco Use  . Smoking status: Never Smoker  . Smokeless tobacco: Never Used  Vaping Use  . Vaping Use: Never used  Substance and Sexual Activity  . Alcohol use: No    Alcohol/week: 0.0 standard drinks  . Drug use: No  . Sexual activity: Never  Other Topics Concern  . Not on file  Social History Narrative  . Not on file   Social Determinants of Health   Financial Resource Strain: Not on file  Food Insecurity: Not on file  Transportation Needs: Not on file  Physical Activity: Not on file   Stress: Not on file  Social Connections: Not on file  Intimate Partner Violence: Not on file    Outpatient Medications Prior to Visit  Medication Sig Dispense Refill  . Apple Cider Vinegar 500 MG TABS Take 500 mg by mouth daily.    [name] aspirin 81 MG tablet Take 81 mg by mouth daily.    . clopidogrel (PLAVIX) 75 MG tablet Take 1 tablet (75 mg total) by mouth daily. 90 tablet 3  . fluticasone (CUTIVATE) 0.05 % cream Apply to affected area 2 times daily    . ibuprofen (ADVIL) 200 MG tablet Take by mouth.    . levothyroxine (SYNTHROID, LEVOTHROID) 75 MCG tablet Take 75 mcg by mouth daily.    [name] losartan (COZAAR) 50 MG tablet TAKE 1 TABLET EVERY DAY 90 tablet 1  . meclizine (ANTIVERT) 25 MG tablet Take 25 mg by mouth 3 (three) times daily as needed.    [name] PARoxetine (PAXIL) 30 MG tablet TAKE 1 TABLET EVERY DAY 90 tablet 1  . polyethylene glycol (MIRALAX / GLYCOLAX) packet Take 17 g by mouth daily as needed for mild constipation.    . pregabalin (LYRICA) 100 MG capsule TAKE 100MG  IN THE MORNING AND 50MG  AT NIGHT FOR ONE WEEK, THEN INCREASE TO 100MG  TWICE DAILY AS DIRECTED    . rosuvastatin (CRESTOR) 40 MG tablet TAKE 1 TABLET EVERY DAY 90 tablet 2  . temazepam (RESTORIL) 15 MG capsule TAKE 1 CAPSULE AT BEDTIME AS NEEDED 30 capsule 0  . gabapentin (NEURONTIN) 100 MG capsule Take 100 mg by mouth 2 (two) times daily.     No facility-administered medications prior to visit.    Allergies  Allergen Reactions  . Flagyl [Metronidazole]     Throat burning   . Omnicef [Cefdinir]     Throat burning  . Other     Steroids "all steroids causes burning in esophagus and stomach"  . Prednisone Other (See Comments)    inflammed esophagus  . Shrimp [Shellfish Allergy]     "sick on stomach"  . Tylenol [Acetaminophen] Other (See Comments)    Rash with high dose   . Vicodin [Hydrocodone-Acetaminophen] Nausea And Vomiting  . Amoxicillin Anxiety  . Ibuprofen Rash    Can't take high doses    Review of  Systems  Constitutional: Negative.   HENT: Negative.   Respiratory: Negative.   Cardiovascular: Negative.   Gastrointestinal: Positive  for constipation. Negative for abdominal pain, anal bleeding, blood in stool, diarrhea, nausea, rectal pain and vomiting.  Genitourinary: Negative.   Musculoskeletal: Positive for arthralgias and back pain. Negative for gait problem, joint swelling, myalgias, neck pain and neck stiffness.  Skin: Negative.  Negative for rash.  Neurological: Negative.   Psychiatric/Behavioral: Negative.        Objective:    Physical Exam Vitals reviewed.  Constitutional:      General: She is not in acute distress.    Appearance: Normal appearance. She is not ill-appearing, toxic-appearing or diaphoretic.     Comments: Patient appers well, not sickly. Speaking in complete sentences. Patient moves on and off of exam table and in room without difficulty. Gait is normal in hall and in room. Patient is oriented to person place time and situation. Patient answers questions appropriately and engages eye contact and verbal dialect with provider.   HENT:     Head: Normocephalic and atraumatic.     Right Ear: External ear normal.     Left Ear: External ear normal.     Nose: Nose normal. No congestion.  Eyes:     Extraocular Movements: Extraocular movements intact.     Conjunctiva/sclera: Conjunctivae normal.     Pupils: Pupils are equal, round, and reactive to light.  Neck:     Vascular: No carotid bruit.  Cardiovascular:     Rate and Rhythm: Normal rate and regular rhythm.     Pulses: Normal pulses.     Heart sounds: Normal heart sounds. No murmur heard. No friction rub. No gallop.   Pulmonary:     Effort: Pulmonary effort is normal. No respiratory distress.     Breath sounds: Normal breath sounds. No stridor. No wheezing, rhonchi or rales.  Chest:     Chest wall: No tenderness.  Abdominal:     General: There is no distension.     Palpations: Abdomen is soft. There  is no mass.     Tenderness: There is no abdominal tenderness. There is no right CVA tenderness, left CVA tenderness, guarding or rebound.     Hernia: A hernia (small palpaple easily reduces in right lower quadrant ) is present.  Musculoskeletal:     Cervical back: Normal, normal range of motion and neck supple. No rigidity or tenderness.     Thoracic back: Normal.     Lumbar back: Spasms and tenderness present. No swelling, deformity or signs of trauma.     Right hip: Tenderness and bony tenderness present. No crepitus. Normal strength.     Left hip: Normal.     Right upper leg: Normal. No swelling or edema.     Left upper leg: Normal.     Right knee: Normal.     Left knee: Normal.     Right lower leg: Edema (trace bilateral ankle. ) present.     Left lower leg: Edema present.     Right ankle: Normal. Normal pulse.     Right Achilles Tendon: Normal.     Left ankle: Normal pulse.     Left Achilles Tendon: Normal.     Right foot: Normal.     Left foot: Normal.       Legs:     Comments: Skin lower extremities normal. She has no color change, erythema noted.  Femoral pulse right groin 2+.    Lymphadenopathy:     Cervical: No cervical adenopathy.  Skin:    General: Skin is warm.     Comments:  No rash or zoster.   Neurological:     Mental Status: She is oriented to person, place, and time.     Motor: No weakness.     Coordination: Coordination normal.     Gait: Gait normal.     Deep Tendon Reflexes: Reflexes normal.     BP 140/70 (BP Location: Left Arm, Patient Position: Sitting)   Pulse 63   Temp (!) 97 F (36.1 C)   Ht 5' 2.99" (1.6 m)   Wt 140 lb (63.5 kg)   LMP 01/17/1972   SpO2 95%   BMI 24.81 kg/m  Wt Readings from Last 3 Encounters:  05/31/20 140 lb (63.5 kg)  04/13/20 140 lb 3.2 oz (63.6 kg)  11/29/19 139 lb 8 oz (63.3 kg)    Health Maintenance Due  Topic Date Due  . FOOT EXAM  Never done  . TETANUS/TDAP  Never done  . DEXA SCAN  Never done  .  OPHTHALMOLOGY EXAM  07/10/2017  . COVID-19 Vaccine (3 - Pfizer risk 4-dose series) 05/07/2019    There are no preventive care reminders to display for this patient.   Lab Results  Component Value Date   TSH 1.23 03/28/2020   Lab Results  Component Value Date   WBC 4.3 03/28/2020   HGB 12.0 03/28/2020   HCT 35.5 (L) 03/28/2020   MCV 84.7 03/28/2020   PLT 280.0 03/28/2020   Lab Results  Component Value Date   NA 140 03/28/2020   K 4.4 03/28/2020   CO2 30 03/28/2020   GLUCOSE 92 03/28/2020   BUN 13 03/28/2020   CREATININE 0.82 03/28/2020   BILITOT 0.5 03/28/2020   ALKPHOS 64 03/28/2020   AST 21 03/28/2020   ALT 14 03/28/2020   PROT 6.8 03/28/2020   ALBUMIN 4.2 03/28/2020   CALCIUM 9.2 03/28/2020   ANIONGAP 11 10/04/2019   GFR 63.08 03/28/2020   Lab Results  Component Value Date   CHOL 199 03/28/2020   Lab Results  Component Value Date   HDL 74.30 03/28/2020   Lab Results  Component Value Date   LDLCALC 105 (H) 03/28/2020   Lab Results  Component Value Date   TRIG 100.0 03/28/2020   Lab Results  Component Value Date   CHOLHDL 3 03/28/2020   Lab Results  Component Value Date   HGBA1C 5.9 03/28/2020       Assessment & Plan:   Problem List Items Addressed This Visit      Musculoskeletal and Integument   Arthritis, degenerative   Relevant Medications   cyclobenzaprine (FLEXERIL) 5 MG tablet   Other Relevant Orders   DG HIPS BILAT W OR W/O PELVIS 3-4 VIEWS   DG Abd 1 View   DG Lumbar Spine Complete     Other   Acute right hip pain - Primary   Relevant Orders   DG Abd 1 View   CBC with Differential/Platelet   Comprehensive metabolic panel   Constipation   Relevant Orders   DG HIPS BILAT W OR W/O PELVIS 3-4 VIEWS   Acute bilateral low back pain   Relevant Medications   cyclobenzaprine (FLEXERIL) 5 MG tablet    Other Visit Diagnoses    Neuropathy         1. Acute right hip pain  - DG HIPS BILAT W OR W/O PELVIS 3-4 VIEWS - CBC with  Differential/Platelet - Comprehensive metabolic panel  2. Neuropathy Seems to be improving she is on Gabapentin and Lyrica tried to stop one  she and daughter report but she could not tolerate not taking.   3. Osteoarthritis of other site, unspecified osteoarthritis type  - DG HIPS BILAT W OR W/O PELVIS 3-4 VIEWS  - DG Lumbar Spine Complete  4. Constipation, unspecified constipation type Abdomen KUB    5. Chronic back pain.  Will give muscle relaxer to try, for possible muscle spasm. Discussed side effects.  She has allergy to prednisone can not take tylenol or Ibuprofen.  She can try Aleve once daily per package for 1- 2 weeks.  - cyclobenzaprine (FLEXERIL) 5 MG tablet; Take 1 tablet (5 mg total) by mouth at bedtime.  Dispense: 15 tablet; Refill: 0  Meds ordered this encounter  Medications  . cyclobenzaprine (FLEXERIL) 5 MG tablet    Sig: Take 1 tablet (5 mg total) by mouth at bedtime.    Dispense:  15 tablet    Refill:  0    Does cause drowsiness.   Red Flags discussed. The patient was given clear instructions to go to ER or return to medical center if any red flags develop, symptoms do not improve, worsen or new problems develop. They verbalized understanding.   Return precautions given. Red Flags discussed. The patient was given clear instructions to go to ER or return to medical center if any red flags develop, symptoms do not improve, worsen or new problems develop. They verbalized understanding.    Risks, benefits, and alternatives of the medications and treatment plan prescribed today were discussed, and patient expressed understanding.    Education regarding symptom management and diagnosis given to patient on AVS.  Patient was in agreement with treatment plan.   Continue to follow with  Kelby Aline. Astou Lada AGNP-C, FNP-C for routine health maintenance.   Kelby Aline. Lory Galan AGNP-C, FNP-C  Marcille Buffy, FNP

## 2020-05-31 NOTE — Patient Instructions (Signed)
Cyclobenzaprine tablets What is this medicine? CYCLOBENZAPRINE (sye kloe BEN za preen) is a muscle relaxer. It is used to treat muscle pain, spasms, and stiffness. This medicine may be used for other purposes; ask your health care provider or pharmacist if you have questions. COMMON BRAND NAME(S): Fexmid, Flexeril What should I tell my health care provider before I take this medicine? They need to know if you have any of these conditions:  heart disease, irregular heartbeat, or previous heart attack  liver disease  thyroid problem  an unusual or allergic reaction to cyclobenzaprine, tricyclic antidepressants, lactose, other medicines, foods, dyes, or preservatives  pregnant or trying to get pregnant  breast-feeding How should I use this medicine? Take this medicine by mouth with a glass of water. Follow the directions on the prescription label. If this medicine upsets your stomach, take it with food or milk. Take your medicine at regular intervals. Do not take it more often than directed. Talk to your pediatrician regarding the use of this medicine in children. Special care may be needed. Overdosage: If you think you have taken too much of this medicine contact a poison control center or emergency room at once. NOTE: This medicine is only for you. Do not share this medicine with others. What if I miss a dose? If you miss a dose, take it as soon as you can. If it is almost time for your next dose, take only that dose. Do not take double or extra doses. What may interact with this medicine? Do not take this medicine with any of the following medications:  MAOIs like Carbex, Eldepryl, Marplan, Nardil, and Parnate  narcotic medicines for cough  safinamide This medicine may also interact with the following medications:  alcohol  bupropion  antihistamines for allergy, cough and cold  certain medicines for anxiety or sleep  certain medicines for bladder problems like oxybutynin,  tolterodine  certain medicines for depression like amitriptyline, fluoxetine, sertraline  certain medicines for Parkinson's disease like benztropine, trihexyphenidyl  certain medicines for seizures like phenobarbital, primidone  certain medicines for stomach problems like dicyclomine, hyoscyamine  certain medicines for travel sickness like scopolamine  general anesthetics like halothane, isoflurane, methoxyflurane, propofol  ipratropium  local anesthetics like lidocaine, pramoxine, tetracaine  medicines that relax muscles for surgery  narcotic medicines for pain  phenothiazines like chlorpromazine, mesoridazine, prochlorperazine, thioridazine  verapamil This list may not describe all possible interactions. Give your health care provider a list of all the medicines, herbs, non-prescription drugs, or dietary supplements you use. Also tell them if you smoke, drink alcohol, or use illegal drugs. Some items may interact with your medicine. What should I watch for while using this medicine? Tell your doctor or health care professional if your symptoms do not start to get better or if they get worse. You may get drowsy or dizzy. Do not drive, use machinery, or do anything that needs mental alertness until you know how this medicine affects you. Do not stand or sit up quickly, especially if you are an older patient. This reduces the risk of dizzy or fainting spells. Alcohol may interfere with the effect of this medicine. Avoid alcoholic drinks. If you are taking another medicine that also causes drowsiness, you may have more side effects. Give your health care provider a list of all medicines you use. Your doctor will tell you how much medicine to take. Do not take more medicine than directed. Call emergency for help if you have problems breathing or unusual sleepiness.  Your mouth may get dry. Chewing sugarless gum or sucking hard candy, and drinking plenty of water may help. Contact your  doctor if the problem does not go away or is severe. What side effects may I notice from receiving this medicine? Side effects that you should report to your doctor or health care professional as soon as possible:  allergic reactions like skin rash, itching or hives, swelling of the face, lips, or tongue  breathing problems  chest pain  fast, irregular heartbeat  hallucinations  seizures  unusually weak or tired Side effects that usually do not require medical attention (report to your doctor or health care professional if they continue or are bothersome):  headache  nausea, vomiting This list may not describe all possible side effects. Call your doctor for medical advice about side effects. You may report side effects to FDA at 1-800-FDA-1088. Where should I keep my medicine? Keep out of the reach of children. Store at room temperature between 15 and 30 degrees C (59 and 86 degrees F). Keep container tightly closed. Throw away any unused medicine after the expiration date. NOTE: This sheet is a summary. It may not cover all possible information. If you have questions about this medicine, talk to your doctor, pharmacist, or health care provider.  2021 Elsevier/Gold Standard (2017-12-17 12:49:26) Chronic Back Pain When back pain lasts longer than 3 months, it is called chronic back pain. Pain may get worse at certain times (flare-ups). There are things you can do at home to manage your pain. Follow these instructions at home: Pay attention to any changes in your symptoms. Take these actions to help with your pain: Managing pain and stiffness  If told, put ice on the painful area. Your doctor may tell you to use ice for 24-48 hours after the flare-up starts. To do this: ? Put ice in a plastic bag. ? Place a towel between your skin and the bag. ? Leave the ice on for 20 minutes, 2-3 times a day.  If told, put heat on the painful area. Do this as often as told by your doctor. Use  the heat source that your doctor recommends, such as a moist heat pack or a heating pad. ? Place a towel between your skin and the heat source. ? Leave the heat on for 20-30 minutes. ? Take off the heat if your skin turns bright red. This is especially important if you are unable to feel pain, heat, or cold. You may have a greater risk of getting burned.  Soak in a warm bath. This can help relieve pain.      Activity  Avoid bending and other activities that make pain worse.  When standing: ? Keep your upper back and neck straight. ? Keep your shoulders pulled back. ? Avoid slouching.  When sitting: ? Keep your back straight. ? Relax your shoulders. Do not round your shoulders or pull them backward.  Do not sit or stand in one place for long periods of time.  Take short rest breaks during the day. Lying down or standing is usually better than sitting. Resting can help relieve pain.  When sitting or lying down for a long time, do some mild activity or stretching. This will help to prevent stiffness and pain.  Get regular exercise. Ask your doctor what activities are safe for you.  Do not lift anything that is heavier than 10 lb (4.5 kg) or the limit that you are told, until your doctor says that  it is safe.  To prevent injury when you lift things: ? Bend your knees. ? Keep the weight close to your body. ? Avoid twisting.  Sleep on a firm mattress. Try lying on your side with your knees slightly bent. If you lie on your back, put a pillow under your knees.   Medicines  Treatment may include medicines for pain and swelling taken by mouth or put on the skin, prescription pain medicine, or muscle relaxants.  Take over-the-counter and prescription medicines only as told by your doctor.  Ask your doctor if the medicine prescribed to you: ? Requires you to avoid driving or using machinery. ? Can cause trouble pooping (constipation). You may need to take these actions to prevent or  treat trouble pooping:  Drink enough fluid to keep your pee (urine) pale yellow.  Take over-the-counter or prescription medicines.  Eat foods that are high in fiber. These include beans, whole grains, and fresh fruits and vegetables.  Limit foods that are high in fat and sugars. These include fried or sweet foods. General instructions  Do not use any products that contain nicotine or tobacco, such as cigarettes, e-cigarettes, and chewing tobacco. If you need help quitting, ask your doctor.  Keep all follow-up visits as told by your doctor. This is important. Contact a doctor if:  Your pain does not get better with rest or medicine.  Your pain gets worse, or you have new pain.  You have a high fever.  You lose weight very quickly.  You have trouble doing your normal activities. Get help right away if:  One or both of your legs or feet feel weak.  One or both of your legs or feet lose feeling (have numbness).  You have trouble controlling when you poop (have a bowel movement) or pee (urinate).  You have bad back pain and: ? You feel like you may vomit (nauseous), or you vomit. ? You have pain in your belly (abdomen). ? You have shortness of breath. ? You faint. Summary  When back pain lasts longer than 3 months, it is called chronic back pain.  Pain may get worse at certain times (flare-ups).  Use ice and heat as told by your doctor. Your doctor may tell you to use ice after flare-ups. This information is not intended to replace advice given to you by your health care provider. Make sure you discuss any questions you have with your health care provider. Document Revised: 02/24/2019 Document Reviewed: 02/24/2019 Elsevier Patient Education  2021 [place]. Hip Pain The hip is the joint between the upper legs and the lower pelvis. The bones, cartilage, tendons, and muscles of your hip joint support your body and allow you to move around. Hip pain can range from a  minor ache to severe pain in one or both of your hips. The pain may be felt on the inside of the hip joint near the groin, or on the outside near the buttocks and upper thigh. You may also have swelling or stiffness in your hip area. Follow these instructions at home: Managing pain, stiffness, and swelling  If directed, put ice on the painful area. To do this: ? Put ice in a plastic bag. ? Place a towel between your skin and the bag. ? Leave the ice on for 20 minutes, 2-3 times a day.  If directed, apply heat to the affected area as often as told by your health care provider. Use the heat source that your health care  provider recommends, such as a moist heat pack or a heating pad. ? Place a towel between your skin and the heat source. ? Leave the heat on for 20-30 minutes. ? Remove the heat if your skin turns bright red. This is especially important if you are unable to feel pain, heat, or cold. You may have a greater risk of getting burned.      Activity  Do exercises as told by your health care provider.  Avoid activities that cause pain. General instructions  Take over-the-counter and prescription medicines only as told by your health care provider.  Keep a journal of your symptoms. Write down: ? How often you have hip pain. ? The location of your pain. ? What the pain feels like. ? What makes the pain worse.  Sleep with a pillow between your legs on your most comfortable side.  Keep all follow-up visits as told by your health care provider. This is important.   Contact a health care provider if:  You cannot put weight on your leg.  Your pain or swelling continues or gets worse after one week.  It gets harder to walk.  You have a fever. Get help right away if:  You fall.  You have a sudden increase in pain and swelling in your hip.  Your hip is red or swollen or very tender to touch. Summary  Hip pain can range from a minor ache to severe pain in one or both of  your hips.  The pain may be felt on the inside of the hip joint near the groin, or on the outside near the buttocks and upper thigh.  Avoid activities that cause pain.  Write down how often you have hip pain, the location of the pain, what makes it worse, and what it feels like. This information is not intended to replace advice given to you by your health care provider. Make sure you discuss any questions you have with your health care provider. Document Revised: 06/01/2018 Document Reviewed: 06/01/2018 Elsevier Patient Education  Tamara [name].

## 2020-06-01 ENCOUNTER — Other Ambulatory Visit: Payer: Self-pay | Admitting: Internal Medicine

## 2020-06-01 ENCOUNTER — Telehealth: Payer: Self-pay | Admitting: Internal Medicine

## 2020-06-01 LAB — CBC WITH DIFFERENTIAL/PLATELET
Basophils Absolute: 0 10*3/uL (ref 0.0–0.1)
Basophils Relative: 1 % (ref 0.0–3.0)
Eosinophils Absolute: 0 10*3/uL (ref 0.0–0.7)
Eosinophils Relative: 1.4 % (ref 0.0–5.0)
HCT: 32.9 % — ABNORMAL LOW (ref 36.0–46.0)
Hemoglobin: 11.1 g/dL — ABNORMAL LOW (ref 12.0–15.0)
Lymphocytes Relative: 37.3 % (ref 12.0–46.0)
Lymphs Abs: 1.3 10*3/uL (ref 0.7–4.0)
MCHC: 33.7 g/dL (ref 30.0–36.0)
MCV: 84.7 fl (ref 78.0–100.0)
Monocytes Absolute: 0.4 10*3/uL (ref 0.1–1.0)
Monocytes Relative: 10.4 % (ref 3.0–12.0)
Neutro Abs: 1.7 10*3/uL (ref 1.4–7.7)
Neutrophils Relative %: 49.9 % (ref 43.0–77.0)
Platelets: 264 10*3/uL (ref 150.0–400.0)
RBC: 3.88 Mil/uL (ref 3.87–5.11)
RDW: 15.9 % — ABNORMAL HIGH (ref 11.5–15.5)
WBC: 3.4 10*3/uL — ABNORMAL LOW (ref 4.0–10.5)

## 2020-06-01 LAB — COMPREHENSIVE METABOLIC PANEL
ALT: 13 U/L (ref 0–35)
AST: 20 U/L (ref 0–37)
Albumin: 4 g/dL (ref 3.5–5.2)
Alkaline Phosphatase: 69 U/L (ref 39–117)
BUN: 19 mg/dL (ref 6–23)
CO2: 28 mEq/L (ref 19–32)
Calcium: 8.6 mg/dL (ref 8.4–10.5)
Chloride: 105 mEq/L (ref 96–112)
Creatinine, Ser: 0.91 mg/dL (ref 0.40–1.20)
GFR: 55.6 mL/min — ABNORMAL LOW (ref >60.00)
Glucose, Bld: 89 mg/dL (ref 70–99)
Potassium: 4.3 mEq/L (ref 3.5–5.1)
Sodium: 141 mEq/L (ref 135–145)
Total Bilirubin: 0.3 mg/dL (ref 0.2–1.2)
Total Protein: 6.6 g/dL (ref 6.0–8.3)

## 2020-06-01 NOTE — Telephone Encounter (Signed)
Left message for patient to call back and schedule Medicare Annual Wellness Visit (AWV)   Please schedule on the [place] template  Last AWV 64/3/32; please schedule at anytime   This should be a 45 minute visit

## 2020-06-01 NOTE — Progress Notes (Signed)
Hemoglobin is low, but stable from review of previous labs.  CMP is ok and stable.   Would add lab  B12 and iron/TIBC/ ferritin given anemia and verify no bleeding. She has had low B12 in past.

## 2020-06-01 NOTE — Telephone Encounter (Signed)
Called and spoke to Ladue. Explained that the labs have not come back as of yet and that we will call once they are resulted. Candace verbalized understanding and had no further questions.

## 2020-06-01 NOTE — Telephone Encounter (Signed)
Pt daughter called and wanted to know if her mothers labs and xrays are back from yesterday. Please call Candace at [phone]

## 2020-06-03 ENCOUNTER — Other Ambulatory Visit: Payer: Self-pay | Admitting: Internal Medicine

## 2020-06-05 NOTE — Telephone Encounter (Signed)
RX Refill:restoril Last Seen:04-13-20 Last ordered:04-13-20

## 2020-06-05 NOTE — Progress Notes (Signed)
Lumbar spine x ray shows degenerative disc disease and radiologist does note x ray not significantly change from last x ray 06/15/20.   If she has persistent pain or any worsening symptoms would recommend follow up and further work up.

## 2020-06-05 NOTE — Progress Notes (Signed)
Minimal degenerative disease of both hips seen on x ray.  If not improving  with treatment plan please follow up as needed at anytime.

## 2020-06-05 NOTE — Progress Notes (Signed)
X ray of abdomen negative except it does show moderate stool burden/ constipation, would recommend increasing liquids, try warm prune or apple juice. Fiber supplement  per package and increasing fiber rich foods. May also try laxative per  Miralax per package just as needed until relief per package instructions.  Return at anytime if symptoms worsening or not resolved at anytime.

## 2020-06-06 NOTE — Telephone Encounter (Signed)
rx ok'd for temazepam #30 no refills.  Please advise her not to take this with other sedating medications (for example, muscle relaxer just prescribed).

## 2020-06-07 NOTE — Telephone Encounter (Signed)
LMTCB

## 2020-06-09 ENCOUNTER — Telehealth: Payer: Self-pay | Admitting: Internal Medicine

## 2020-06-09 NOTE — Telephone Encounter (Signed)
Patient saw Sharyn Lull for hip pain. Was given flexeril to take at bedtime but the pain is worse during day. She cannot take a lot of tylenol because it causes her to break out in hives. She has been using ibuprofen 200mg  but only taking one tablet and not much relief. After talking with daughter she is not requesting pain meds. She is going to try taking 400 mg of ibuprofen. Tried Aleve and stated the ibuprofen works better. Daughter is going to let us know if they need anything.

## 2020-06-09 NOTE — Telephone Encounter (Signed)
Patient's daughter called wanted to know if there was something else Dr.Scott could call in or recommend for to do or take for the pain

## 2020-06-10 ENCOUNTER — Telehealth: Payer: Self-pay | Admitting: Family Medicine

## 2020-06-10 MED ORDER — NIRMATRELVIR/RITONAVIR (PAXLOVID)TABLET: ORAL_TABLET | ORAL | 0 refills | Status: DC

## 2020-06-10 NOTE — Telephone Encounter (Signed)
On call note: daughter reports pt with + covid test this morning, has 24h hx of fatigue, nasal cong, malaise.  No SOB or fever. Pt high risk for covid complications. I eRx'd paxlovid after review of pt's chart and speaking with her daughter. Signed:  Crissie Sickles, MD           06/10/2020

## 2020-06-12 ENCOUNTER — Telehealth: Payer: Self-pay | Admitting: Internal Medicine

## 2020-06-12 NOTE — Telephone Encounter (Signed)
See other notes

## 2020-06-12 NOTE — Telephone Encounter (Signed)
Please call and confirm that she is doing ok.  (received notification tested postiiive for covid).  Oral antiviral called in by Dr Ernestine Conrad.

## 2020-06-12 NOTE — Telephone Encounter (Signed)
Dr. Anitra Lauth addressed over the weekend gave antivirals. Spoke with patient daughter she says her symptoms have been mild so far she seems to be doing pretty good fever coming down from 101 to 76. This morning has been much better.  Patient daughter needs note for work for being out with mother this week she does not want to leave her alone, and she had covid last wee herself please advise.

## 2020-06-12 NOTE — Telephone Encounter (Signed)
[place] Night - Cl TELEPHONE ADVICE RECORD [facility] Patient Name: Tamara [name] ITH Gender: Female DOB: April 03, 1927 Age: 87 Y 103 M 17 D Return Phone Number: [phone] (Primary), [phone] (Secondary) Address: City/ State/ Zip: [place]  [address] Client [facility] Night - Cl Client Site [place] Physician Einar Pheasant - MD Contact Type Call Who Is Calling Patient / Member / Family / Caregiver Call Type Triage / Clinical Caller Name Camie Hauss Relationship To Patient Daughter Return Phone Number [phone] (Primary) Chief Complaint Fever (non urgent symptom) (> THREE MONTHS) Reason for Call Symptomatic / Request for Health Information Initial Comment Her mother just tested positive for covid, has fever 99, wants rx called in Translation No Nurse Assessment Nurse: Venia Minks, RN, Cherie Date/Time (Eastern Time): 06/10/2020 8:16:18 AM Confirm and document reason for call. If symptomatic, describe symptoms. ---Caller states her mother tested positive for covid. Caller also positve for covid. Temp 99 oral. Caller is request antivirals. Does the patient have any new or worsening symptoms? ---Yes Will a triage be completed? ---Yes Related visit to physician within the last 2 weeks? ---Yes Does the PT have any chronic conditions? (i.e. diabetes, asthma, this includes High risk factors for pregnancy, etc.) ---Yes List chronic conditions. ---high cholesterol, degenerative, stroke, hand tremor, blood thinner Is this a behavioral health or substance abuse call? ---No Guidelines Guideline Title Affirmed Question Affirmed Notes Nurse Date/Time ([place] Time) COVID-19 - Diagnosed or Suspected [1] HIGH RISK for severe COVID complications (e.g., weak immune system, age > 70 Venia Minks, [place], [place] 06/10/2020 8:21:02 AM PLEASE NOTE: All timestamps contained within  this report are represented as [place] Standard Time. CONFIDENTIALTY NOTICE: This fax transmission is intended only for the addressee. It contains information that is legally privileged, confidential or otherwise protected from use or disclosure. If you are not the intended recipient, you are strictly prohibited from reviewing, disclosing, copying using or disseminating any of this information or taking any action in reliance on or regarding this information. If you have received this fax in error, please notify us immediately by telephone so that we can arrange for its return to [place]. Phone: [phone], Toll-Free: [phone], Fax: [phone] Page: 2 of 3 Call Id: [id number] Guidelines Guideline Title Affirmed Question Affirmed Notes Nurse Date/Time Eilene Ghazi Time) years, obesity with BMI > 25, pregnant, chronic lung disease or other chronic medical condition) AND [2] COVID symptoms (e.g., cough, fever) (Exceptions: Already seen by PCP and no new or worsening symptoms.) Disp. Time Eilene Ghazi Time) Disposition Final User 06/10/2020 8:40:05 AM Paged On Call back to [facility], [place], [facility] 06/10/2020 8:51:22 AM Call PCP Now Yes Venia Minks, RN, Cherie Caller Disagree/Comply Comply Caller Understands Yes PreDisposition Call Doctor Care Advice Given Per Guideline CALL PCP NOW: * You need to discuss this with your doctor (or NP/PA). * I'll page the on-call provider now. If you haven't heard from the provider (or me) within 30 minutes, call again. GENERAL CARE ADVICE FOR COVID-19 SYMPTOMS: * The symptoms are generally treated the same whether you have COVID-19, influenza or some other respiratory virus. * Feeling dehydrated: Drink extra liquids. If the air in your home is dry, use a humidifier. * Fever: For fever over 101 F (38.3 C), take acetaminophen every 4 to 6 hours (Adults 650 mg) OR ibuprofen every 6 to 8 hours (Adults 400 mg). Before taking any medicine, read all the instructions  on the package. Do not take aspirin unless your  doctor has prescribed it for you. COUGH MEDICINES: * COUGH DROPS: Over-the-counter cough drops can help a lot, especially for mild coughs. They soothe an irritated throat and remove the tickle sensation in the back of the throat. Cough drops are easy to carry with you. * COUGH SYRUP WITH DEXTROMETHORPHAN: An over-the-counter cough syrup can help your cough. The most common cough suppressant in overthe-counter cough medicines is dextromethorphan. * HOME REMEDY - HARD CANDY: Hard candy works just as well as overthe-counter cough drops. People who have diabetes should use sugar-free candy. * HOME REMEDY - HONEY: This old home remedy has been shown to help decrease coughing at night. The adult dosage is 2 teaspoons (10 ml) at bedtime. * Cough: Use cough drops. COVID-19 - HOW TO PROTECT OTHERS - WHEN YOU ARE SICK WITH COVID-19: * WEAR A MASK FOR 10 DAYS: Wear a well-fitted mask for 10 full days any time you are around others inside your home or in public. Do not go to places where you are unable to wear a mask. * [place] HANDS OFTEN: Wash hands often with soap and water. After coughing or sneezing are important times. If soap and water are not available, use an alcohol-based hand sanitizer with at least 60% alcohol, covering all surfaces of your hands and rubbing them together until they feel dry. Avoid touching your eyes, nose, and mouth with unwashed hands. * CALL AHEAD IF MEDICAL CARE IS NEEDED: If you have a medical appointment, call your doctor's office and tell them you have or may have COVID-19. This will help the office protect themselves and other patients. They will give you directions. CALL BACK IF: * You become worse PLEASE NOTE: All timestamps contained within this report are represented as [place] Standard Time. CONFIDENTIALTY NOTICE: This fax transmission is intended only for the addressee. It contains information that is legally privileged,  confidential or otherwise protected from use or disclosure. If you are not the intended recipient, you are strictly prohibited from reviewing, disclosing, copying using or disseminating any of this information or taking any action in reliance on or regarding this information. If you have received this fax in error, please notify us immediately by telephone so that we can arrange for its return to [place]. Phone: [phone], Toll-Free: [phone], Fax: [phone] Page: 3 of 3 Call Id: [id number] Comments User: Micheline Maze, RN Date/Time Eilene Ghazi Time): 06/10/2020 8:20:43 AM current symptoms are cough and sore throat, nasal congestion. User: Micheline Maze, RN Date/Time Eilene Ghazi Time): 06/10/2020 8:22:11 AM Covid vaccinated User: Micheline Maze, RN Date/Time Eilene Ghazi Time): 06/10/2020 8:23:14 AM tested positive with home test today. Paging DoctorName Phone DateTime Result/ Outcome Message Type Notes Crissie Sickles - MD [phone] 06/10/2020 8:40:05 AM Paged On Call Back to Call Center Doctor Paged Please call back [phone] [facility] RN Access Nurse Triage Crissie Sickles - MD 06/10/2020 8:50:48 AM Spoke with On Call - General Message Result Spoke with on call, updated on patient situation, given caller number and info, also given pharmacy info and phone number. On call provider will call patient at this time

## 2020-06-12 NOTE — Telephone Encounter (Signed)
Letter typed.  Attempted to print and did not print.  Can you see if you can print.  Thanks.

## 2020-06-12 NOTE — Telephone Encounter (Signed)
Letter printed and emailed to daughter.

## 2020-06-13 NOTE — Telephone Encounter (Signed)
Letter resent to patients daughter

## 2020-06-13 NOTE — Telephone Encounter (Signed)
Daughter called and did not receive the email. She would like you to try sending letter to [email or web address]

## 2020-07-31 ENCOUNTER — Other Ambulatory Visit: Payer: Self-pay | Admitting: Family

## 2020-08-14 ENCOUNTER — Ambulatory Visit: Payer: Medicare Other | Admitting: Internal Medicine

## 2020-08-28 DIAGNOSIS — Z20822 Contact with and (suspected) exposure to covid-19: Secondary | ICD-10-CM | POA: Diagnosis not present

## 2020-09-13 DIAGNOSIS — Z20822 Contact with and (suspected) exposure to covid-19: Secondary | ICD-10-CM | POA: Diagnosis not present

## 2020-09-19 DIAGNOSIS — M48062 Spinal stenosis, lumbar region with neurogenic claudication: Secondary | ICD-10-CM | POA: Diagnosis not present

## 2020-09-19 DIAGNOSIS — M545 Low back pain, unspecified: Secondary | ICD-10-CM | POA: Diagnosis not present

## 2020-09-19 DIAGNOSIS — G8929 Other chronic pain: Secondary | ICD-10-CM | POA: Diagnosis not present

## 2020-09-22 ENCOUNTER — Ambulatory Visit: Payer: Medicare Other | Admitting: Internal Medicine

## 2020-10-01 DIAGNOSIS — Z20822 Contact with and (suspected) exposure to covid-19: Secondary | ICD-10-CM | POA: Diagnosis not present

## 2020-10-17 DIAGNOSIS — Z20822 Contact with and (suspected) exposure to covid-19: Secondary | ICD-10-CM | POA: Diagnosis not present

## 2020-10-18 DIAGNOSIS — E890 Postprocedural hypothyroidism: Secondary | ICD-10-CM | POA: Diagnosis not present

## 2020-10-18 DIAGNOSIS — R221 Localized swelling, mass and lump, neck: Secondary | ICD-10-CM | POA: Diagnosis not present

## 2020-10-18 DIAGNOSIS — E559 Vitamin D deficiency, unspecified: Secondary | ICD-10-CM | POA: Diagnosis not present

## 2020-10-18 DIAGNOSIS — Z85850 Personal history of malignant neoplasm of thyroid: Secondary | ICD-10-CM | POA: Diagnosis not present

## 2020-10-30 ENCOUNTER — Other Ambulatory Visit: Payer: Self-pay | Admitting: Internal Medicine

## 2020-11-06 ENCOUNTER — Telehealth: Payer: Self-pay | Admitting: Internal Medicine

## 2020-11-06 NOTE — Telephone Encounter (Signed)
Ok

## 2020-11-06 NOTE — Telephone Encounter (Signed)
Patient's daughter calling in and would like a Handicap placard form for the Patient.   They would like to pick this up and a call back when ready if approved.

## 2020-11-06 NOTE — Telephone Encounter (Signed)
OK to complete form for her?

## 2020-11-07 NOTE — Telephone Encounter (Signed)
Called patient to confirm if they would like handicap form placed up front for pick up or if they would like to have it mailed. Unable to leave message

## 2020-11-08 NOTE — Telephone Encounter (Signed)
Form completed. Daughter will pick up in the morning.

## 2020-11-20 DIAGNOSIS — Z20822 Contact with and (suspected) exposure to covid-19: Secondary | ICD-10-CM | POA: Diagnosis not present

## 2020-11-24 ENCOUNTER — Ambulatory Visit: Payer: Medicare Other

## 2020-12-04 DIAGNOSIS — Z961 Presence of intraocular lens: Secondary | ICD-10-CM | POA: Diagnosis not present

## 2020-12-07 ENCOUNTER — Telehealth: Payer: Self-pay | Admitting: Internal Medicine

## 2020-12-07 NOTE — Telephone Encounter (Signed)
Refill  temazepam (RESTORIL) 15 MG capsule

## 2020-12-07 NOTE — Telephone Encounter (Signed)
RX Refill: restoril Last Seen: 04-13-20 Last Ordered: 06-06-20 Next Appt: 12-14-20

## 2020-12-08 ENCOUNTER — Other Ambulatory Visit: Payer: Self-pay | Admitting: Internal Medicine

## 2020-12-08 NOTE — Telephone Encounter (Signed)
Unable to leave message for patient to return call back. Phone kept ringing.

## 2020-12-08 NOTE — Telephone Encounter (Signed)
She has a scheduled appt 12/14/20.  Needs to keep appt.  Is she out of medication?  Also, Pharmacy marked in the mail order pharmacy.  Does she want the rx to go to mail order or local pharmacy.

## 2020-12-11 ENCOUNTER — Other Ambulatory Visit: Payer: Self-pay | Admitting: Internal Medicine

## 2020-12-11 NOTE — Telephone Encounter (Signed)
Pt daughter called in requesting updated information on medication refill. Pt daughter stated she only have a few more days left of medication. Pt daughter stated that her mother can not run out this prescription.Pt daughter would like call back at [phone]

## 2020-12-11 NOTE — Telephone Encounter (Signed)
Unable to leave message for patient to return call back. Phone kept ringing.

## 2020-12-11 NOTE — Addendum Note (Signed)
Addended by: Fulton Mole D on: 12/11/2020 04:45 PM   Modules accepted: Orders

## 2020-12-12 ENCOUNTER — Telehealth: Payer: Self-pay

## 2020-12-12 NOTE — Telephone Encounter (Signed)
Please call pt (or her daughter).  This rx has not been refilled since 05/2020.  She is taking lyrica now.  Does she still need this medication?  Has appt this week.  Also, please notify [facility] that she has an appt this week and we are trying to clarify if needs medication.

## 2020-12-12 NOTE — Telephone Encounter (Signed)
Left message for Tamara [name] to return call back.

## 2020-12-12 NOTE — Telephone Encounter (Signed)
Unable to leave message for patient to return call back. Phone kept ringing.  Closing note due to being unable to reach patient. I will be sending a mychart as well to try and reach patient.

## 2020-12-12 NOTE — Telephone Encounter (Signed)
Pharmacy has also been notified of PCP recommendation. Awaiting for candace to return call.   You Just now (9:14 AM)   Left message for candace to return call back.        Note     Einar Pheasant, MD routed conversation to You 6 hours ago (3:04 AM)   Einar Pheasant, MD 6 hours ago (3:04 AM)   Please call pt (or her daughter).  This rx has not been refilled since 05/2020.  She is taking lyrica now.  Does she still need this medication?  Has appt this week.  Also, please notify [facility] that she has an appt this week and we are trying to clarify if needs medication.       Note    Gordy Councilman, CMA  Einar Pheasant, MD 16 hours ago (4:45 PM)   Pt daughter called in requesting updated information on medication refill for temazepam Pt daughter stated she only have a few more days left of medication. Pt daughter stated that her mother can not run out this prescription.  Melida Gimenez, CMA 16 hours ago (4:45 PM)   Addended by: Fulton Mole D on: 12/11/2020 04:45 PM     Modules accepted: Orders       Note    Thea Gist routed conversation to Lars Masson, LPN 18 hours ago (5:92 PM)   Thea Gist 18 hours ago (2:32 PM)   JB Pt daughter called in requesting updated information on medication refill. Pt daughter stated she only have a few more days left of medication. Pt daughter stated that her mother can not run out this prescription.Pt daughter would like call back at [phone]

## 2020-12-13 ENCOUNTER — Telehealth: Payer: Self-pay

## 2020-12-13 NOTE — Telephone Encounter (Signed)
See other messages regarding refill.  I can refill if she needs, just needed to clarify.  Notes states cannot do without medication, but in reviewing refill history, she received #30 tablets in May.  No refill since.  Wanted to clarify this was the medication needed (because she is also taking lyrica).  Regarding the visit, I will do whichever one they prefer.

## 2020-12-14 ENCOUNTER — Telehealth (INDEPENDENT_AMBULATORY_CARE_PROVIDER_SITE_OTHER): Payer: Medicare Other | Admitting: Internal Medicine

## 2020-12-14 DIAGNOSIS — I10 Essential (primary) hypertension: Secondary | ICD-10-CM

## 2020-12-14 DIAGNOSIS — Z8673 Personal history of transient ischemic attack (TIA), and cerebral infarction without residual deficits: Secondary | ICD-10-CM

## 2020-12-14 DIAGNOSIS — I6523 Occlusion and stenosis of bilateral carotid arteries: Secondary | ICD-10-CM

## 2020-12-14 DIAGNOSIS — F439 Reaction to severe stress, unspecified: Secondary | ICD-10-CM

## 2020-12-14 DIAGNOSIS — Z85850 Personal history of malignant neoplasm of thyroid: Secondary | ICD-10-CM | POA: Diagnosis not present

## 2020-12-14 DIAGNOSIS — F32A Depression, unspecified: Secondary | ICD-10-CM

## 2020-12-14 DIAGNOSIS — I700 Atherosclerosis of aorta: Secondary | ICD-10-CM | POA: Diagnosis not present

## 2020-12-14 DIAGNOSIS — E7800 Pure hypercholesterolemia, unspecified: Secondary | ICD-10-CM

## 2020-12-14 DIAGNOSIS — I2510 Atherosclerotic heart disease of native coronary artery without angina pectoris: Secondary | ICD-10-CM

## 2020-12-14 DIAGNOSIS — R739 Hyperglycemia, unspecified: Secondary | ICD-10-CM | POA: Diagnosis not present

## 2020-12-14 DIAGNOSIS — D649 Anemia, unspecified: Secondary | ICD-10-CM

## 2020-12-14 MED ORDER — TEMAZEPAM 15 MG PO CAPS: 15.0000 mg | ORAL_CAPSULE | Freq: Every evening | ORAL | 0 refills | Status: AC | PRN

## 2020-12-14 NOTE — Telephone Encounter (Signed)
Confirmed restoril is what is needed. Will discuss further during virtual appt at 2

## 2020-12-14 NOTE — Telephone Encounter (Signed)
LM for daughter to call back. Ok to do virtual appt if this is what they prefer but need to confirm temazepam is what she needs refilled

## 2020-12-14 NOTE — Progress Notes (Signed)
Patient ID: Tamara [name], female   DOB: 16-May-1927, 87 y.o.   MRN: [id number]   Virtual Visit via telephone Note  This visit type was conducted due to national recommendations for restrictions regarding the COVID-19 pandemic (e.g. social distancing).  This format is felt to be most appropriate for this patient at this time.  All issues noted in this document were discussed and addressed.  No physical exam was performed (except for noted visual exam findings with Video Visits).   I connected with Tamara [name] by telephone and verified that I am speaking with the correct person using two identifiers. Location patient: home Location provider: work  Persons participating in the virtual visit: patient, provider  The limitations, risks, security and privacy concerns of performing an evaluation and management service by telephone and the availability of in person appointments have been discussed.  It has also been discussed with the patient that there may be a patient responsible charge related to this service. The patient expressed understanding and agreed to proceed.   Reason for visit: follow up appt  HPI: She has moved.  Living with her daughter. Planning to establish with PCP where she is living.  Does need refill on temazepam.  She is doing relatively well.  No chest pain or sob reported.  Eating.  No vomiting.  No bowel change reported.  Temazepam - takes prn.  Does not use regularly.  Overall she feels she is doing relatively well.     ROS: See pertinent positives and negatives per HPI.  Past Medical History:  Diagnosis Date   Anemia    Arthritis    Chicken pox    Colitis    Degenerative joint disease    Diverticulitis    Diverticulosis    Fibrocystic breast disease    GERD (gastroesophageal reflux disease)    EGD - gastritis/mild duodenitis, previous positive H. pylori   Glaucoma    Heart murmur    Helicobacter pylori ab+    History of depression    History of skin cancer     facial   Hypercholesterolemia    Hypertension    IBS (irritable bowel syndrome)    Internal hemorrhoids    Migraines    Osteopenia    Stroke ([place])    Thyroid disease    Tubular adenoma of colon    Vitamin D deficiency     Past Surgical History:  Procedure Laterality Date   ABDOMINAL HYSTERECTOMY  1973   secondary to bleeding   APPENDECTOMY     BILATERAL SALPINGOOPHORECTOMY  2004   complex ovarian cyst (left) - benign   BREAST BIOPSY Bilateral    x4- neg   MANDIBLE FRACTURE SURGERY     VENTRAL HERNIA REPAIR  2005   Dr. Tamala Julian    Family History  Problem Relation Age of Onset   Esophageal cancer Mother    Stomach cancer Mother    Heart disease Father    Cirrhosis Brother    Parkinson's disease Brother    Ovarian cancer Sister    Colon cancer Sister        half-sister   Migraines Daughter    Heart attack Son    Breast cancer Neg Hx     SOCIAL HX: reviewed.    Current Outpatient Medications:    aspirin 81 MG tablet, Take 81 mg by mouth daily., Disp: , Rfl:    clopidogrel (PLAVIX) 75 MG tablet, TAKE 1 TABLET (75 MG TOTAL) BY MOUTH DAILY., Disp: 90 tablet,  Rfl: 3   cyclobenzaprine (FLEXERIL) 5 MG tablet, Take 1 tablet (5 mg total) by mouth at bedtime., Disp: 15 tablet, Rfl: 0   gabapentin (NEURONTIN) 100 MG capsule, Take 100 mg by mouth 2 (two) times daily., Disp: , Rfl:    ibuprofen (ADVIL) 200 MG tablet, Take by mouth., Disp: , Rfl:    levothyroxine (SYNTHROID, LEVOTHROID) 75 MCG tablet, Take 75 mcg by mouth daily., Disp: , Rfl:    losartan (COZAAR) 50 MG tablet, TAKE 1 TABLET EVERY DAY, Disp: 90 tablet, Rfl: 1   PARoxetine (PAXIL) 30 MG tablet, TAKE 1 TABLET EVERY DAY, Disp: 90 tablet, Rfl: 1   polyethylene glycol (MIRALAX / GLYCOLAX) packet, Take 17 g by mouth daily as needed for mild constipation., Disp: , Rfl:    pregabalin (LYRICA) 100 MG capsule, TAKE 100MG IN THE MORNING AND 50MG AT NIGHT FOR ONE WEEK, THEN INCREASE TO 100MG TWICE DAILY AS DIRECTED, Disp: ,  Rfl:    rosuvastatin (CRESTOR) 40 MG tablet, TAKE 1 TABLET EVERY DAY, Disp: 90 tablet, Rfl: 2   temazepam (RESTORIL) 15 MG capsule, Take 1 capsule (15 mg total) by mouth at bedtime as needed for sleep., Disp: 30 capsule, Rfl: 0  EXAM:  GENERAL: alert. Appears to be in no acute distress.  Answering questions appropriately   PSYCH/NEURO: pleasant and cooperative, no obvious depression or anxiety, speech and thought processing grossly intact  ASSESSMENT AND PLAN:  Discussed the following assessment and plan:  Problem List Items Addressed This Visit     Anemia    Follow cbc.       Aortic atherosclerosis (HCC)    Continue crestor.       CAD (coronary artery disease)    Continue risk factor modification.  Currently asymptomatic.       Depression    Takes restoril prn.  With the move, needs a little something prn.  Refill restoril.  Will f/u with new PCP.  Follow closely.        History of CVA (cerebrovascular accident)    On aspirin and plavix.  Follow.       History of thyroid cancer    S/p thyroidectomy.  On synthroid.  Goal .3 - 2.  Followed by endocrinology.        Hypercholesterolemia    On crestor.  Low cholesterol diet and exercise.  Follow lipid panel and liver function tests.        Hyperglycemia    Low carb diet and exercise.  Follow met b and a1c.       Hypertension    Continue losartan.  Follow pressures.  Follow metabolic panel.       Stress    Overall appears to be handling things well.  Follow.         Return if symptoms worsen or fail to improve.   I discussed the assessment and treatment plan with the patient. The patient was provided an opportunity to ask questions and all were answered. The patient agreed with the plan and demonstrated an understanding of the instructions.   The patient was advised to call back or seek an in-person evaluation if the symptoms worsen or if the condition fails to improve as anticipated.  I provided 23 minutes  of non-face-to-face time during this encounter.   Einar Pheasant, MD

## 2020-12-15 NOTE — Telephone Encounter (Signed)
Rx sent in for temazepam.  See phone note.

## 2020-12-22 ENCOUNTER — Encounter: Payer: Self-pay | Admitting: Internal Medicine

## 2020-12-22 NOTE — Assessment & Plan Note (Signed)
S/p thyroidectomy.  On synthroid.  Goal .3 - 2.  Followed by endocrinology.

## 2020-12-22 NOTE — Assessment & Plan Note (Signed)
Continue risk factor modification.  Currently asymptomatic.

## 2020-12-22 NOTE — Assessment & Plan Note (Signed)
Takes restoril prn.  With the move, needs a little something prn.  Refill restoril.  Will f/u with new PCP.  Follow closely.

## 2020-12-22 NOTE — Assessment & Plan Note (Signed)
Follow cbc.  

## 2020-12-22 NOTE — Assessment & Plan Note (Signed)
On crestor.  Low cholesterol diet and exercise.  Follow lipid panel and liver function tests.   

## 2020-12-22 NOTE — Assessment & Plan Note (Signed)
Low carb diet and exercise.  Follow met b and a1c.  

## 2020-12-22 NOTE — Assessment & Plan Note (Signed)
Continue losartan.  Follow pressures.  Follow metabolic panel.  

## 2020-12-22 NOTE — Assessment & Plan Note (Signed)
On aspirin and plavix.  Follow.

## 2020-12-22 NOTE — Assessment & Plan Note (Signed)
Overall appears to be handling things well.  Follow.  ?

## 2020-12-22 NOTE — Assessment & Plan Note (Signed)
Continue crestor 

## 2020-12-23 DIAGNOSIS — K219 Gastro-esophageal reflux disease without esophagitis: Secondary | ICD-10-CM | POA: Diagnosis not present

## 2020-12-23 DIAGNOSIS — I10 Essential (primary) hypertension: Secondary | ICD-10-CM | POA: Diagnosis not present

## 2020-12-23 DIAGNOSIS — J986 Disorders of diaphragm: Secondary | ICD-10-CM | POA: Diagnosis not present

## 2020-12-23 DIAGNOSIS — M1990 Unspecified osteoarthritis, unspecified site: Secondary | ICD-10-CM | POA: Diagnosis not present

## 2020-12-23 DIAGNOSIS — R590 Localized enlarged lymph nodes: Secondary | ICD-10-CM | POA: Diagnosis not present

## 2020-12-23 DIAGNOSIS — Z872 Personal history of diseases of the skin and subcutaneous tissue: Secondary | ICD-10-CM | POA: Diagnosis not present

## 2020-12-23 DIAGNOSIS — K047 Periapical abscess without sinus: Secondary | ICD-10-CM | POA: Diagnosis not present

## 2020-12-23 DIAGNOSIS — Z20822 Contact with and (suspected) exposure to covid-19: Secondary | ICD-10-CM | POA: Diagnosis not present

## 2020-12-23 DIAGNOSIS — E7800 Pure hypercholesterolemia, unspecified: Secondary | ICD-10-CM | POA: Diagnosis not present

## 2020-12-23 DIAGNOSIS — M278 Other specified diseases of jaws: Secondary | ICD-10-CM | POA: Diagnosis not present

## 2020-12-23 DIAGNOSIS — J029 Acute pharyngitis, unspecified: Secondary | ICD-10-CM | POA: Diagnosis not present

## 2020-12-23 DIAGNOSIS — K5790 Diverticulosis of intestine, part unspecified, without perforation or abscess without bleeding: Secondary | ICD-10-CM | POA: Diagnosis not present

## 2020-12-23 DIAGNOSIS — Z8673 Personal history of transient ischemic attack (TIA), and cerebral infarction without residual deficits: Secondary | ICD-10-CM | POA: Diagnosis not present

## 2020-12-23 DIAGNOSIS — J111 Influenza due to unidentified influenza virus with other respiratory manifestations: Secondary | ICD-10-CM | POA: Diagnosis not present

## 2020-12-23 DIAGNOSIS — Z7982 Long term (current) use of aspirin: Secondary | ICD-10-CM | POA: Diagnosis not present

## 2020-12-23 DIAGNOSIS — E0789 Other specified disorders of thyroid: Secondary | ICD-10-CM | POA: Diagnosis not present

## 2020-12-23 DIAGNOSIS — R9431 Abnormal electrocardiogram [ECG] [EKG]: Secondary | ICD-10-CM | POA: Diagnosis not present

## 2020-12-23 DIAGNOSIS — Z79899 Other long term (current) drug therapy: Secondary | ICD-10-CM | POA: Diagnosis not present

## 2020-12-23 DIAGNOSIS — R531 Weakness: Secondary | ICD-10-CM | POA: Diagnosis not present

## 2020-12-23 DIAGNOSIS — Z7902 Long term (current) use of antithrombotics/antiplatelets: Secondary | ICD-10-CM | POA: Diagnosis not present

## 2020-12-23 DIAGNOSIS — I7090 Unspecified atherosclerosis: Secondary | ICD-10-CM | POA: Diagnosis not present

## 2020-12-23 DIAGNOSIS — I4510 Unspecified right bundle-branch block: Secondary | ICD-10-CM | POA: Diagnosis not present

## 2020-12-23 DIAGNOSIS — Z79890 Hormone replacement therapy (postmenopausal): Secondary | ICD-10-CM | POA: Diagnosis not present

## 2020-12-23 DIAGNOSIS — R0600 Dyspnea, unspecified: Secondary | ICD-10-CM | POA: Diagnosis not present

## 2020-12-29 ENCOUNTER — Telehealth: Payer: Self-pay | Admitting: Internal Medicine

## 2020-12-29 NOTE — Telephone Encounter (Signed)
Copied from [place] [id number]. Topic: Medicare AWV >> Dec 29, 2020 11:50 AM Harris-Coley, Hannah Beat wrote: Reason for CRM: Left message for patient to re-schedule Annual Wellness Visit appt 01/11/21  Please schedule with Nurse Health Advisor Denisa O'Brien-Blaney, LPN at [facility].  Please call [phone] ask for [facility]

## 2021-01-10 DIAGNOSIS — I10 Essential (primary) hypertension: Secondary | ICD-10-CM | POA: Diagnosis not present

## 2021-01-10 DIAGNOSIS — E782 Mixed hyperlipidemia: Secondary | ICD-10-CM | POA: Diagnosis not present

## 2021-01-10 DIAGNOSIS — I700 Atherosclerosis of aorta: Secondary | ICD-10-CM | POA: Diagnosis not present

## 2021-01-10 DIAGNOSIS — G8929 Other chronic pain: Secondary | ICD-10-CM | POA: Diagnosis not present

## 2021-01-10 DIAGNOSIS — R590 Localized enlarged lymph nodes: Secondary | ICD-10-CM | POA: Diagnosis not present

## 2021-01-10 DIAGNOSIS — Z8673 Personal history of transient ischemic attack (TIA), and cerebral infarction without residual deficits: Secondary | ICD-10-CM | POA: Diagnosis not present

## 2021-01-10 DIAGNOSIS — Z862 Personal history of diseases of the blood and blood-forming organs and certain disorders involving the immune mechanism: Secondary | ICD-10-CM | POA: Diagnosis not present

## 2021-01-10 DIAGNOSIS — R7301 Impaired fasting glucose: Secondary | ICD-10-CM | POA: Diagnosis not present

## 2021-01-10 DIAGNOSIS — M545 Low back pain, unspecified: Secondary | ICD-10-CM | POA: Diagnosis not present

## 2021-01-10 DIAGNOSIS — F330 Major depressive disorder, recurrent, mild: Secondary | ICD-10-CM | POA: Diagnosis not present

## 2021-01-10 DIAGNOSIS — I2510 Atherosclerotic heart disease of native coronary artery without angina pectoris: Secondary | ICD-10-CM | POA: Diagnosis not present

## 2021-01-11 ENCOUNTER — Ambulatory Visit: Payer: Medicare Other

## 2021-02-23 ENCOUNTER — Other Ambulatory Visit: Payer: Self-pay | Admitting: Internal Medicine

## 2021-03-03 ENCOUNTER — Other Ambulatory Visit: Payer: Self-pay | Admitting: Internal Medicine

## 2021-03-08 DIAGNOSIS — I2510 Atherosclerotic heart disease of native coronary artery without angina pectoris: Secondary | ICD-10-CM | POA: Diagnosis not present

## 2021-03-08 DIAGNOSIS — E785 Hyperlipidemia, unspecified: Secondary | ICD-10-CM | POA: Diagnosis not present

## 2021-03-08 DIAGNOSIS — R0989 Other specified symptoms and signs involving the circulatory and respiratory systems: Secondary | ICD-10-CM | POA: Diagnosis not present

## 2021-03-08 DIAGNOSIS — Z8673 Personal history of transient ischemic attack (TIA), and cerebral infarction without residual deficits: Secondary | ICD-10-CM | POA: Diagnosis not present

## 2021-03-08 DIAGNOSIS — I700 Atherosclerosis of aorta: Secondary | ICD-10-CM | POA: Diagnosis not present

## 2021-03-08 DIAGNOSIS — R011 Cardiac murmur, unspecified: Secondary | ICD-10-CM | POA: Diagnosis not present

## 2021-03-14 DIAGNOSIS — R011 Cardiac murmur, unspecified: Secondary | ICD-10-CM | POA: Diagnosis not present

## 2021-03-29 ENCOUNTER — Telehealth: Payer: Self-pay

## 2021-03-29 NOTE — Telephone Encounter (Signed)
I called and spoke with the patients daughter and she stated that Dr. Nicki Reaper is no longer the patients PCP because the patient moved to [facility] and has a new doctor.  I informed her that I would remove Dr. Nicki Reaper as her PCP and she understood.  Shawnita Krizek,cma  ?

## 2021-04-05 DIAGNOSIS — I2510 Atherosclerotic heart disease of native coronary artery without angina pectoris: Secondary | ICD-10-CM | POA: Diagnosis not present

## 2021-04-05 DIAGNOSIS — I10 Essential (primary) hypertension: Secondary | ICD-10-CM | POA: Diagnosis not present

## 2021-04-05 DIAGNOSIS — Z8673 Personal history of transient ischemic attack (TIA), and cerebral infarction without residual deficits: Secondary | ICD-10-CM | POA: Diagnosis not present

## 2021-04-05 DIAGNOSIS — I350 Nonrheumatic aortic (valve) stenosis: Secondary | ICD-10-CM | POA: Diagnosis not present

## 2021-04-05 DIAGNOSIS — Z2821 Immunization not carried out because of patient refusal: Secondary | ICD-10-CM | POA: Diagnosis not present

## 2021-04-05 DIAGNOSIS — Z0000 Encounter for general adult medical examination without abnormal findings: Secondary | ICD-10-CM | POA: Diagnosis not present

## 2021-04-05 DIAGNOSIS — F330 Major depressive disorder, recurrent, mild: Secondary | ICD-10-CM | POA: Diagnosis not present

## 2021-04-05 DIAGNOSIS — E782 Mixed hyperlipidemia: Secondary | ICD-10-CM | POA: Diagnosis not present

## 2021-04-05 DIAGNOSIS — Z85850 Personal history of malignant neoplasm of thyroid: Secondary | ICD-10-CM | POA: Diagnosis not present

## 2021-04-25 DIAGNOSIS — R1031 Right lower quadrant pain: Secondary | ICD-10-CM | POA: Diagnosis not present

## 2021-04-25 DIAGNOSIS — K5900 Constipation, unspecified: Secondary | ICD-10-CM | POA: Diagnosis not present

## 2021-05-22 DIAGNOSIS — N3001 Acute cystitis with hematuria: Secondary | ICD-10-CM | POA: Diagnosis not present

## 2021-06-14 ENCOUNTER — Other Ambulatory Visit: Payer: Self-pay | Admitting: Internal Medicine

## 2021-06-22 DIAGNOSIS — K5900 Constipation, unspecified: Secondary | ICD-10-CM | POA: Diagnosis not present

## 2021-07-03 ENCOUNTER — Telehealth: Payer: Self-pay | Admitting: Cardiovascular Disease

## 2021-07-03 NOTE — Telephone Encounter (Signed)
Moved declined recall

## 2021-07-16 DIAGNOSIS — M5416 Radiculopathy, lumbar region: Secondary | ICD-10-CM | POA: Diagnosis not present

## 2021-07-16 DIAGNOSIS — M79604 Pain in right leg: Secondary | ICD-10-CM | POA: Diagnosis not present

## 2021-07-18 DIAGNOSIS — C73 Malignant neoplasm of thyroid gland: Secondary | ICD-10-CM | POA: Diagnosis not present

## 2021-07-18 DIAGNOSIS — E890 Postprocedural hypothyroidism: Secondary | ICD-10-CM | POA: Diagnosis not present

## 2021-08-08 DIAGNOSIS — E041 Nontoxic single thyroid nodule: Secondary | ICD-10-CM | POA: Diagnosis not present

## 2021-08-08 DIAGNOSIS — E890 Postprocedural hypothyroidism: Secondary | ICD-10-CM | POA: Diagnosis not present

## 2021-08-08 DIAGNOSIS — Z85850 Personal history of malignant neoplasm of thyroid: Secondary | ICD-10-CM | POA: Diagnosis not present

## 2021-08-15 DIAGNOSIS — M533 Sacrococcygeal disorders, not elsewhere classified: Secondary | ICD-10-CM | POA: Diagnosis not present

## 2021-08-15 DIAGNOSIS — M461 Sacroiliitis, not elsewhere classified: Secondary | ICD-10-CM | POA: Diagnosis not present

## 2021-08-15 DIAGNOSIS — M5416 Radiculopathy, lumbar region: Secondary | ICD-10-CM | POA: Diagnosis not present

## 2021-08-17 DIAGNOSIS — I350 Nonrheumatic aortic (valve) stenosis: Secondary | ICD-10-CM | POA: Diagnosis not present

## 2021-08-17 DIAGNOSIS — I10 Essential (primary) hypertension: Secondary | ICD-10-CM | POA: Diagnosis not present

## 2021-08-17 DIAGNOSIS — E785 Hyperlipidemia, unspecified: Secondary | ICD-10-CM | POA: Diagnosis not present

## 2021-08-17 DIAGNOSIS — M47818 Spondylosis without myelopathy or radiculopathy, sacral and sacrococcygeal region: Secondary | ICD-10-CM | POA: Diagnosis not present

## 2021-08-17 DIAGNOSIS — M533 Sacrococcygeal disorders, not elsewhere classified: Secondary | ICD-10-CM | POA: Diagnosis not present

## 2021-08-17 DIAGNOSIS — I2510 Atherosclerotic heart disease of native coronary artery without angina pectoris: Secondary | ICD-10-CM | POA: Diagnosis not present

## 2021-09-03 DIAGNOSIS — M5416 Radiculopathy, lumbar region: Secondary | ICD-10-CM | POA: Diagnosis not present

## 2021-09-06 DIAGNOSIS — M24152 Other articular cartilage disorders, left hip: Secondary | ICD-10-CM | POA: Diagnosis not present

## 2021-09-06 DIAGNOSIS — G96191 Perineural cyst: Secondary | ICD-10-CM | POA: Diagnosis not present

## 2021-09-06 DIAGNOSIS — K429 Umbilical hernia without obstruction or gangrene: Secondary | ICD-10-CM | POA: Diagnosis not present

## 2021-09-06 DIAGNOSIS — K4090 Unilateral inguinal hernia, without obstruction or gangrene, not specified as recurrent: Secondary | ICD-10-CM | POA: Diagnosis not present

## 2021-09-06 DIAGNOSIS — M419 Scoliosis, unspecified: Secondary | ICD-10-CM | POA: Diagnosis not present

## 2021-09-06 DIAGNOSIS — M160 Bilateral primary osteoarthritis of hip: Secondary | ICD-10-CM | POA: Diagnosis not present

## 2021-09-06 DIAGNOSIS — K5730 Diverticulosis of large intestine without perforation or abscess without bleeding: Secondary | ICD-10-CM | POA: Diagnosis not present

## 2021-09-06 DIAGNOSIS — M5116 Intervertebral disc disorders with radiculopathy, lumbar region: Secondary | ICD-10-CM | POA: Diagnosis not present

## 2021-09-06 DIAGNOSIS — M5115 Intervertebral disc disorders with radiculopathy, thoracolumbar region: Secondary | ICD-10-CM | POA: Diagnosis not present

## 2021-09-06 DIAGNOSIS — M5117 Intervertebral disc disorders with radiculopathy, lumbosacral region: Secondary | ICD-10-CM | POA: Diagnosis not present

## 2021-09-06 DIAGNOSIS — M48061 Spinal stenosis, lumbar region without neurogenic claudication: Secondary | ICD-10-CM | POA: Diagnosis not present

## 2021-09-06 DIAGNOSIS — M24151 Other articular cartilage disorders, right hip: Secondary | ICD-10-CM | POA: Diagnosis not present

## 2021-09-06 DIAGNOSIS — M4726 Other spondylosis with radiculopathy, lumbar region: Secondary | ICD-10-CM | POA: Diagnosis not present

## 2021-09-14 DIAGNOSIS — F332 Major depressive disorder, recurrent severe without psychotic features: Secondary | ICD-10-CM | POA: Diagnosis not present

## 2021-10-08 ENCOUNTER — Other Ambulatory Visit: Payer: Self-pay | Admitting: Internal Medicine

## 2021-10-17 DIAGNOSIS — M5416 Radiculopathy, lumbar region: Secondary | ICD-10-CM | POA: Diagnosis not present

## 2021-10-17 DIAGNOSIS — G5701 Lesion of sciatic nerve, right lower limb: Secondary | ICD-10-CM | POA: Diagnosis not present

## 2021-10-17 DIAGNOSIS — M461 Sacroiliitis, not elsewhere classified: Secondary | ICD-10-CM | POA: Diagnosis not present

## 2021-10-17 DIAGNOSIS — M533 Sacrococcygeal disorders, not elsewhere classified: Secondary | ICD-10-CM | POA: Diagnosis not present

## 2021-10-23 DIAGNOSIS — C73 Malignant neoplasm of thyroid gland: Secondary | ICD-10-CM | POA: Diagnosis not present

## 2021-10-23 DIAGNOSIS — E890 Postprocedural hypothyroidism: Secondary | ICD-10-CM | POA: Diagnosis not present

## 2021-10-23 DIAGNOSIS — M2740 Unspecified cyst of jaw: Secondary | ICD-10-CM | POA: Diagnosis not present

## 2021-11-05 DIAGNOSIS — G5701 Lesion of sciatic nerve, right lower limb: Secondary | ICD-10-CM | POA: Diagnosis not present
